# Patient Record
Sex: Female | Born: 1995
Health system: Southern US, Community
[De-identification: ages and names within clinical notes are randomized; demographics above are authoritative.]

## PROBLEM LIST (undated history)

## (undated) DIAGNOSIS — F419 Anxiety disorder, unspecified: Secondary | ICD-10-CM

## (undated) DIAGNOSIS — I10 Essential (primary) hypertension: Secondary | ICD-10-CM

## (undated) HISTORY — DX: Anxiety disorder, unspecified: F41.9

## (undated) HISTORY — PX: TYMPANOSTOMY TUBE PLACEMENT: SHX32

## (undated) HISTORY — PX: WISDOM TOOTH EXTRACTION: SHX21

## (undated) HISTORY — DX: Essential (primary) hypertension: I10

---
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
## 2013-01-10 ENCOUNTER — Ambulatory Visit: Payer: Self-pay | Admitting: Family Medicine

## 2013-07-02 ENCOUNTER — Encounter (HOSPITAL_COMMUNITY): Payer: Self-pay | Admitting: *Deleted

## 2013-07-02 ENCOUNTER — Emergency Department (HOSPITAL_COMMUNITY)
Admission: EM | Admit: 2013-07-02 | Discharge: 2013-07-02 | Disposition: A | Discharge status: Home / Self Care | Payer: 59 | Source: Home / Self Care | Attending: Family Medicine | Admitting: Family Medicine

## 2013-07-02 ENCOUNTER — Emergency Department (INDEPENDENT_AMBULATORY_CARE_PROVIDER_SITE_OTHER): Payer: 59

## 2013-07-02 DIAGNOSIS — M659 Synovitis and tenosynovitis, unspecified: Secondary | ICD-10-CM

## 2013-07-02 MED ORDER — MELOXICAM 7.5 MG PO TABS: 7.5000 mg | ORAL_TABLET | Freq: Two times a day (BID) | ORAL | Status: DC

## 2013-07-02 NOTE — ED Provider Notes (Addendum)
CSN: [id number]     Arrival date & time 07/02/13  4098 History   First MD Initiated Contact with Patient 07/02/13 1000     Chief Complaint  Patient presents with  . Foot Pain   (Consider location/radiation/quality/duration/timing/severity/associated sxs/prior Treatment) Patient is a 17 y.o. female presenting with lower extremity pain. The history is provided by the patient.  Foot Pain This is a new problem. The current episode started more than 1 week ago (felt like toe cramp initially, worse yest, NKI, wears sandals all the time.). The problem has been gradually worsening. The symptoms are aggravated by walking.    History reviewed. No pertinent past medical history. History reviewed. No pertinent past surgical history. History reviewed. No pertinent family history. History  Substance Use Topics  . Smoking status: Not on file  . Smokeless tobacco: Not on file  . Alcohol Use: Not on file   OB History   Grav Para Term Preterm Abortions TAB SAB Ect Mult Living                 Review of Systems  Constitutional: Negative.   Musculoskeletal: Positive for joint swelling and gait problem. Negative for back pain.  Skin: Negative.     Allergies  Amoxicillin  Home Medications   Current Outpatient Rx  Name  Route  Sig  Dispense  Refill  . meloxicam (MOBIC) 7.5 MG tablet   Oral   Take 1 tablet (7.5 mg total) by mouth 2 (two) times daily after a meal.   30 tablet   1    BP 133/94  Pulse 77  Temp(Src) 97.5 F (36.4 C) (Oral)  Resp 18  SpO2 95% Physical Exam  Nursing note and vitals reviewed. Constitutional: She is oriented to person, place, and time. She appears well-developed and well-nourished.  Musculoskeletal: She exhibits tenderness.       Feet:  Neurological: She is alert and oriented to person, place, and time.  Skin: Skin is warm and dry.    ED Course  Procedures (including critical care time) Labs Review Labs Reviewed - No data to display Imaging  Review Dg Toe Great Right  07/02/2013   CLINICAL DATA:  Great toe pain. Soft tissue swelling.  EXAM: RIGHT GREAT TOE  COMPARISON:  None.  FINDINGS: No fracture or acute bony findings. No foreign body or gas in the soft tissues. No articular erosion observed.  IMPRESSION: Negative.   Electronically Signed   By: Herbie Baltimore   On: 07/02/2013 10:36    MDM  X-rays reviewed and report per radiologist.     Linna Hoff, MD 07/02/13 1048  Linna Hoff, MD 07/02/13 1050

## 2013-07-02 NOTE — ED Notes (Signed)
Pt  Reports    r  Foot  Pain         X  1  Week   =-  denys  Any  specefic  Injury      Ambulatory  To  Exam room     Sitting  Upright on  Exam table  In no  Acute  Distress

## 2013-12-25 ENCOUNTER — Emergency Department (INDEPENDENT_AMBULATORY_CARE_PROVIDER_SITE_OTHER)
Admission: EM | Admit: 2013-12-25 | Discharge: 2013-12-25 | Disposition: A | Discharge status: Home / Self Care | Payer: 59 | Source: Home / Self Care | Attending: Emergency Medicine | Admitting: Emergency Medicine

## 2013-12-25 ENCOUNTER — Encounter: Payer: Self-pay | Admitting: Emergency Medicine

## 2013-12-25 DIAGNOSIS — J069 Acute upper respiratory infection, unspecified: Secondary | ICD-10-CM

## 2013-12-25 MED ORDER — METHYLPREDNISOLONE ACETATE 80 MG/ML IJ SUSP
80.0000 mg | Freq: Once | INTRAMUSCULAR | Status: AC
  Administered 2013-12-25: 80 mg via INTRAMUSCULAR

## 2013-12-25 MED ORDER — GUAIFENESIN-CODEINE 100-10 MG/5ML PO SYRP: 5.0000 mL | ORAL_SOLUTION | Freq: Four times a day (QID) | ORAL | Status: DC | PRN

## 2013-12-25 MED ORDER — AZITHROMYCIN 250 MG PO TABS: ORAL_TABLET | ORAL | Status: DC

## 2013-12-25 NOTE — ED Notes (Signed)
On break from college; has felt congested with cough x 2 weeks; body aches, sore throat, headaches and mild shortness of breath with cough. Took Ibuprofen at 1:00pm.

## 2013-12-25 NOTE — ED Provider Notes (Signed)
CSN: [id number]     Arrival date & time 12/25/13  1549 History   First MD Initiated Contact with Patient 12/25/13 1628     Chief Complaint  Patient presents with  . Cough  . Nasal Congestion  . Headache  . Generalized Body Aches  . Shortness of Breath   (Consider location/radiation/quality/duration/timing/severity/associated sxs/prior Treatment) HPI Severa is a 18 y.o. female who complains of onset of cold symptoms for 2 weeks.  The symptoms are constant and mild-moderate in severity.  No known sick contacts.  Started in chest, progressing to sinuses and worsening.  Took Sudafed and Ibu which didn't help much.  Here with her mom. + sore throat + cough No pleuritic pain No wheezing + nasal congestion + post-nasal drainage + sinus pain/pressure + chest congestion No itchy/red eyes No earache No hemoptysis No SOB No chills/sweats No fever No nausea No vomiting No abdominal pain No diarrhea No skin rashes + fatigue + myalgias + headache     History reviewed. No pertinent past medical history. Past Surgical History  Procedure Laterality Date  . Tympanostomy tube placement     Family History  Problem Relation Age of Onset  . Hypertension Mother   . Thyroid disease Mother   . Hypertension Maternal Aunt   . Hypertension Maternal Uncle    History  Substance Use Topics  . Smoking status: Never Smoker   . Smokeless tobacco: Not on file  . Alcohol Use: No   OB History   Grav Para Term Preterm Abortions TAB SAB Ect Mult Living                 Review of Systems  All other systems reviewed and are negative.    Allergies  Amoxicillin  Home Medications   Current Outpatient Rx  Name  Route  Sig  Dispense  Refill  . azithromycin (ZITHROMAX Z-PAK) 250 MG tablet      Use as directed   1 each   0   . guaiFENesin-codeine (ROBITUSSIN AC) 100-10 MG/5ML syrup   Oral   Take 5 mLs by mouth 4 (four) times daily as needed for cough or congestion.   120 mL   0    . meloxicam (MOBIC) 7.5 MG tablet   Oral   Take 1 tablet (7.5 mg total) by mouth 2 (two) times daily after a meal.   30 tablet   1    BP 129/84  Pulse 109  Temp(Src) 98.2 F (36.8 C) (Oral)  Resp 16  SpO2 98%  LMP 12/17/2013 Physical Exam  Nursing note and vitals reviewed. Constitutional: She is oriented to person, place, and time. She appears well-developed and well-nourished.  HENT:  Head: Normocephalic and atraumatic.  Right Ear: Tympanic membrane, external ear and ear canal normal.  Left Ear: External ear and ear canal normal. Tympanic membrane is scarred.  Nose: Mucosal edema and rhinorrhea present.  Mouth/Throat: Posterior oropharyngeal erythema present. No oropharyngeal exudate or posterior oropharyngeal edema.  Eyes: No scleral icterus.  Neck: Neck supple.  Cardiovascular: Regular rhythm and normal heart sounds.   Pulmonary/Chest: Effort normal and breath sounds normal. No respiratory distress. She has no decreased breath sounds. She has no wheezes. She has no rhonchi.  Neurological: She is alert and oriented to person, place, and time.  Skin: Skin is warm and dry.  Psychiatric: She has a normal mood and affect. Her speech is normal.    ED Course  Procedures (including critical care time) Labs Review Labs Reviewed -  No data to display Imaging Review No results found.   MDM   1. Acute upper respiratory infections of unspecified site    1)  Take the prescribed antibiotic as instructed.  Zpak Rx + Cheratussin AC cough meds.  Pt & mom requested "a shot" so Depo 80 was given IM which I think is ok since may just be viral and could help the fastest. 2)  Use nasal saline solution (over the counter) at least 3 times a day. 3)  Use over the counter decongestants like Zyrtec-D every 12 hours as needed to help with congestion.  If you have hypertension, do not take medicines with sudafed.  4)  Can take tylenol every 6 hours or motrin every 8 hours for pain or fever. 5)   Follow up with your primary doctor if no improvement in 5-7 days, sooner if increasing pain, fever, or new symptoms.     Marlaine HindJeffrey H Henderson, MD 12/25/13 [id number]

## 2014-04-24 ENCOUNTER — Encounter (INDEPENDENT_AMBULATORY_CARE_PROVIDER_SITE_OTHER): Payer: Self-pay

## 2014-04-24 ENCOUNTER — Encounter: Payer: Self-pay | Admitting: Internal Medicine

## 2014-04-24 ENCOUNTER — Ambulatory Visit (INDEPENDENT_AMBULATORY_CARE_PROVIDER_SITE_OTHER): Payer: 59 | Admitting: Internal Medicine

## 2014-04-24 VITALS — BP 120/82 | HR 84 | Temp 98.4°F | Ht 69.25 in | Wt 140.0 lb

## 2014-04-24 DIAGNOSIS — M25569 Pain in unspecified knee: Secondary | ICD-10-CM

## 2014-04-24 DIAGNOSIS — M25561 Pain in right knee: Secondary | ICD-10-CM

## 2014-04-24 DIAGNOSIS — M25562 Pain in left knee: Principal | ICD-10-CM

## 2014-04-24 NOTE — Patient Instructions (Addendum)

## 2014-04-24 NOTE — Progress Notes (Signed)
HPI  Pt presents to the clinic today to establish care. She does not have a PCP but does seen gyn. She does have some concerns today about knee pain. She reports that this come and goes. It started a few years ago. 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 She does not hear her knees pop or click. The pain does not radiate. She has not noticed any swelling. She has tired Aleve with good relief.  No past medical history on file.  Current Outpatient Prescriptions  Medication Sig Dispense Refill  . Norethindrone-Ethinyl Estradiol-Fe Biphas (LO LOESTRIN FE) 1 MG-10 MCG / 10 MCG tablet Take 1 tablet by mouth daily.       No current facility-administered medications for this visit.    Allergies  Allergen Reactions  . Amoxicillin     REACTION: rash    Family History  Problem Relation Age of Onset  . Hypertension Mother   . Thyroid disease Mother   . Hypertension Maternal Aunt   . Hypertension Maternal Uncle   . Heart disease Maternal Grandmother   . Cancer Maternal Grandmother     Breast    History   Social History  . Marital Status: Single    Spouse Name: N/A    Number of Children: N/A  . Years of Education: N/A   Occupational History  . Not on file.   Social History Main Topics  . Smoking status: Never Smoker   . Smokeless tobacco: Never Used  . Alcohol Use: No  . Drug Use: No  . Sexual Activity: Not on file   Other Topics Concern  . Not on file   Social History Narrative  . No narrative on file    ROS:  Constitutional: Denies fever, malaise, fatigue, headache or abrupt weight changes.  HEENT: Denies eye pain, eye redness, ear pain, ringing in the ears, wax buildup, runny nose, nasal congestion, bloody nose, or sore throat. Respiratory: Denies difficulty breathing, shortness of breath, cough or sputum production.   Cardiovascular: Denies chest pain, chest tightness,  palpitations or swelling in the hands or feet.  Gastrointestinal: Denies abdominal pain, bloating, constipation, diarrhea or blood in the stool.  GU: Denies frequency, urgency, pain with urination, blood in urine, odor or discharge. Musculoskeletal: Pt reports knee pain. Denies decrease in range of motion, difficulty with gait, muscle pain or joint swelling.  Skin: Denies redness, rashes, lesions or ulcercations.  Neurological: Denies dizziness, difficulty with memory, difficulty with speech or problems with balance and coordination.   No other specific complaints in a complete review of systems (except as listed in HPI above).  PE:  BP 120/82  Pulse 84  Temp(Src) 98.4 F (36.9 C) (Oral)  Ht 5' 9.25" (1.759 m)  Wt 140 lb (63.504 kg)  BMI 20.52 kg/m2  SpO2 99%  LMP 04/18/2014 Wt Readings from Last 3 Encounters:  04/24/14 140 lb (63.504 kg) (70%*, Z = 0.52)  12/25/13 140 lb (63.504 kg) (71%*, Z = 0.55)  10/18/09 131 lb (59.421 kg) (75%*, Z = 0.67)   * Growth percentiles are based on CDC 2-20 Years data.    General: Appears her stated age, well developed, well nourished in NAD. Cardiovascular: Normal rate and rhythm. S1,S2 noted.  No murmur, rubs or gallops noted. No JVD or BLE edema. No carotid bruits noted. Pulmonary/Chest: Normal effort and positive vesicular breath sounds. No respiratory distress.  No wheezes, rales or ronchi noted.  Musculoskeletal: Normal flexion and extension of the knees. No crepitus noted with ROM. Strength 5/5 BLE. Normal gait.   Assessment and Plan:  Bilateral Knee pain:  Likely due to standing for long periods of time Ok to wear soft knee brace at work Continue Aleve if needed  RTC as needed or if symptoms persist or worsen

## 2014-04-24 NOTE — Progress Notes (Signed)
Pre visit review using our clinic review tool, if applicable. No additional management support is needed unless otherwise documented below in the visit note. 

## 2014-05-18 ENCOUNTER — Encounter: Payer: Self-pay | Admitting: Internal Medicine

## 2014-05-18 ENCOUNTER — Ambulatory Visit (INDEPENDENT_AMBULATORY_CARE_PROVIDER_SITE_OTHER): Payer: 59 | Admitting: Internal Medicine

## 2014-05-18 VITALS — BP 108/60 | HR 94 | Temp 99.3°F | Wt 142.5 lb

## 2014-05-18 DIAGNOSIS — J069 Acute upper respiratory infection, unspecified: Secondary | ICD-10-CM

## 2014-05-18 MED ORDER — AZITHROMYCIN 250 MG PO TABS: ORAL_TABLET | ORAL | Status: DC

## 2014-05-18 MED ORDER — HYDROCODONE-HOMATROPINE 5-1.5 MG/5ML PO SYRP: 5.0000 mL | ORAL_SOLUTION | Freq: Three times a day (TID) | ORAL | Status: DC | PRN

## 2014-05-18 MED ORDER — BENZONATATE 200 MG PO CAPS: 200.0000 mg | ORAL_CAPSULE | Freq: Two times a day (BID) | ORAL | Status: DC | PRN

## 2014-05-18 NOTE — Progress Notes (Signed)
HPI  Pt presents to the clinic today with c/o cough and fever. She reports this started 1 week. The cough is unproductive. She has had associated headache, runny nose, chills and body aches. She has tried Sudafed without relief. She has no history of allergies or breathing problems. She has not had sick contacts that she is aware of. She feels like her symptoms are getting much worse.  Review of Systems     History reviewed. No pertinent past medical history.  Family History  Problem Relation Age of Onset  . Hypertension Mother   . Thyroid disease Mother   . Hypertension Maternal Aunt   . Hypertension Maternal Uncle   . Heart disease Maternal Grandmother   . Cancer Maternal Grandmother     Breast    History   Social History  . Marital Status: Single    Spouse Name: N/A    Number of Children: N/A  . Years of Education: N/A   Occupational History  . Not on file.   Social History Main Topics  . Smoking status: Never Smoker   . Smokeless tobacco: Never Used  . Alcohol Use: No  . Drug Use: No  . Sexual Activity: Yes    Birth Control/ Protection: Pill   Other Topics Concern  . Not on file   Social History Narrative  . No narrative on file    Allergies  Allergen Reactions  . Amoxicillin     REACTION: rash     Constitutional: Positive headache, fatigue and fever. Denies abrupt weight changes.  HEENT:  Positive sore throat. Denies eye redness, eye pain, pressure behind the eyes, facial pain, nasal congestion, ear pain, ringing in the ears, wax buildup, runny nose or bloody nose. Respiratory: Positive cough. Denies difficulty breathing or shortness of breath.  Cardiovascular: Denies chest pain, chest tightness, palpitations or swelling in the hands or feet.   No other specific complaints in a complete review of systems (except as listed in HPI above).  Objective:   BP 108/60  Pulse 94  Temp(Src) 99.3 F (37.4 C) (Oral)  Wt 142 lb 8 oz (64.638 kg)  SpO2 97%   LMP 04/18/2014 Wt Readings from Last 3 Encounters:  05/18/14 142 lb 8 oz (64.638 kg) (73%*, Z = 0.60)  04/24/14 140 lb (63.504 kg) (70%*, Z = 0.52)  12/25/13 140 lb (63.504 kg) (71%*, Z = 0.55)   * Growth percentiles are based on CDC 2-20 Years data.     General: Appears her stated age, well developed, well nourished in NAD. HEENT: Head: normal shape and size; Eyes: sclera white, no icterus, conjunctiva pink, PERRLA and EOMs intact; Ears: Tm's gray and intact, normal light reflex; Nose: mucosa pink and moist, septum midline; Throat/Mouth: + PND. Teeth present, mucosa erythematous and moist, no exudate noted, no lesions or ulcerations noted.  Neck: Mild cervical lymphadenopathy. Neck supple, trachea midline. No massses, lumps or thyromegaly present.  Cardiovascular: Normal rate and rhythm. S1,S2 noted.  No murmur, rubs or gallops noted. No JVD or BLE edema. No carotid bruits noted. Pulmonary/Chest: Normal effort and positive vesicular breath sounds. No respiratory distress. No wheezes, rales or ronchi noted.      Assessment & Plan:   Upper Respiratory Infection:  Get some rest and drink plenty of water Do salt water gargles for the sore throat eRx for Azithromax x 5 days eRx for Hycodan cough syrup for night eRx for tessalon pearsl for daytime  RTC as needed or if symptoms persist.

## 2014-05-18 NOTE — Progress Notes (Signed)
Pre visit review using our clinic review tool, if applicable. No additional management support is needed unless otherwise documented below in the visit note. 

## 2014-05-18 NOTE — Patient Instructions (Addendum)
Upper Respiratory Infection, Adult An upper respiratory infection (URI) is also sometimes known as the common cold. The upper respiratory tract includes the nose, sinuses, throat, trachea, and bronchi. Bronchi are the airways leading to the lungs. Most people improve within 1 week, but symptoms can last up to 2 weeks. A residual cough may last even longer.  CAUSES Many different viruses can infect the tissues lining the upper respiratory tract. The tissues become irritated and inflamed and often become very moist. Mucus production is also common. A cold is contagious. You can easily spread the virus to others by oral contact. This includes kissing, sharing a glass, coughing, or sneezing. Touching your mouth or nose and then touching a surface, which is then touched by another person, can also spread the virus. SYMPTOMS  Symptoms typically develop 1 to 3 days after you come in contact with a cold virus. Symptoms vary from person to person. They may include:  Runny nose.  Sneezing.  Nasal congestion.  Sinus irritation.  Sore throat.  Loss of voice (laryngitis).  Cough.  Fatigue.  Muscle aches.  Loss of appetite.  Headache.  Low-grade fever. DIAGNOSIS  You might diagnose your own cold based on familiar symptoms, since most people get a cold 2 to 3 times a year. Your caregiver can confirm this based on your exam. Most importantly, your caregiver can check that your symptoms are not due to another disease such as strep throat, sinusitis, pneumonia, asthma, or epiglottitis. Blood tests, throat tests, and X-rays are not necessary to diagnose a common cold, but they may sometimes be helpful in excluding other more serious diseases. Your caregiver will decide if any further tests are required. RISKS AND COMPLICATIONS  You may be at risk for a more severe case of the common cold if you smoke cigarettes, have chronic heart disease (such as heart failure) or lung disease (such as asthma), or if  you have a weakened immune system. The very young and very old are also at risk for more serious infections. Bacterial sinusitis, middle ear infections, and bacterial pneumonia can complicate the common cold. The common cold can worsen asthma and chronic obstructive pulmonary disease (COPD). Sometimes, these complications can require emergency medical care and may be life-threatening. PREVENTION  The best way to protect against getting a cold is to practice good hygiene. Avoid oral or hand contact with people with cold symptoms. Wash your hands often if contact occurs. There is no clear evidence that vitamin C, vitamin E, echinacea, or exercise reduces the chance of developing a cold. However, it is always recommended to get plenty of rest and practice good nutrition. TREATMENT  Treatment is directed at relieving symptoms. There is no cure. Antibiotics are not effective, because the infection is caused by a virus, not by bacteria. Treatment may include:  Increased fluid intake. Sports drinks offer valuable electrolytes, sugars, and fluids.  Breathing heated mist or steam (vaporizer or shower).  Eating chicken soup or other clear broths, and maintaining good nutrition.  Getting plenty of rest.  Using gargles or lozenges for comfort.  Controlling fevers with ibuprofen or acetaminophen as directed by your caregiver.  Increasing usage of your inhaler if you have asthma. Zinc gel and zinc lozenges, taken in the first 24 hours of the common cold, can shorten the duration and lessen the severity of symptoms. Pain medicines may help with fever, muscle aches, and throat pain. A variety of non-prescription medicines are available to treat congestion and runny nose. Your caregiver   can make recommendations and may suggest nasal or lung inhalers for other symptoms.  HOME CARE INSTRUCTIONS   Only take over-the-counter or prescription medicines for pain, discomfort, or fever as directed by your  caregiver.  Use a warm mist humidifier or inhale steam from a shower to increase air moisture. This may keep secretions moist and make it easier to breathe.  Drink enough water and fluids to keep your urine clear or pale yellow.  Rest as needed.  Return to work when your temperature has returned to normal or as your caregiver advises. You may need to stay home longer to avoid infecting others. You can also use a face mask and careful hand washing to prevent spread of the virus. SEEK MEDICAL CARE IF:   After the first few days, you feel you are getting worse rather than better.  You need your caregiver's advice about medicines to control symptoms.  You develop chills, worsening shortness of breath, or brown or red sputum. These may be signs of pneumonia.  You develop yellow or brown nasal discharge or pain in the face, especially when you bend forward. These may be signs of sinusitis.  You develop a fever, swollen neck glands, pain with swallowing, or white areas in the back of your throat. These may be signs of strep throat. SEEK IMMEDIATE MEDICAL CARE IF:   You have a fever.  You develop severe or persistent headache, ear pain, sinus pain, or chest pain.  You develop wheezing, a prolonged cough, cough up blood, or have a change in your usual mucus (if you have chronic lung disease).  You develop sore muscles or a stiff neck. Document Released: 03/25/2001 Document Revised: 12/22/2011 Document Reviewed: 01/04/2014 Exit[employer] Patient Information 2015 [employer]. This information is not intended to replace advice given to you by your health care provider. Make sure you discuss any questions you have with your health care provider.  

## 2014-05-23 ENCOUNTER — Telehealth: Payer: Self-pay

## 2014-05-23 NOTE — Telephone Encounter (Signed)
Left msg on mother's VM--letter placed in front office for pick up

## 2014-05-23 NOTE — Telephone Encounter (Signed)
Ok. Mel- can you take care of this? RB

## 2014-05-23 NOTE — Telephone Encounter (Signed)
Misty StanleyLisa pt's mother left v/m requesting a letter for pt to be out of work when seen on 05/18/14. Lisa request cb at [phone]. Pt was out of work from 05/18/14 - 05/21/14.

## 2014-05-30 ENCOUNTER — Encounter: Payer: Self-pay | Admitting: Internal Medicine

## 2014-05-30 ENCOUNTER — Ambulatory Visit (INDEPENDENT_AMBULATORY_CARE_PROVIDER_SITE_OTHER): Payer: 59 | Admitting: Internal Medicine

## 2014-05-30 VITALS — BP 128/89 | HR 88 | Resp 18 | Wt 138.1 lb

## 2014-05-30 DIAGNOSIS — F411 Generalized anxiety disorder: Secondary | ICD-10-CM | POA: Insufficient documentation

## 2014-05-30 DIAGNOSIS — G4700 Insomnia, unspecified: Secondary | ICD-10-CM

## 2014-05-30 MED ORDER — TRAZODONE HCL 50 MG PO TABS: 25.0000 mg | ORAL_TABLET | Freq: Every evening | ORAL | Status: DC | PRN

## 2014-05-30 MED ORDER — ESCITALOPRAM OXALATE 10 MG PO TABS: 10.0000 mg | ORAL_TABLET | Freq: Every day | ORAL | Status: DC

## 2014-05-30 NOTE — Assessment & Plan Note (Signed)
Will start low dose trazadone at night until Lexapro gets in her system Encouraged her to start a bedtime routine, dark room at night, no TV or music on

## 2014-05-30 NOTE — Assessment & Plan Note (Signed)
Will trial low dose lexapro Advised her to give it 4 weeks to get in her system good  RTC in 1 month to reevaluate

## 2014-05-30 NOTE — Progress Notes (Signed)
Subjective:    Patient ID: Sherri Harris, female    DOB: 28-Jul-1996, 18 y.o.   MRN: [id number]  HPI  Pt presents to the clinic today with c/o difficulty sleeping and anxiety. This started years ago but has gotten worse over the last month. She cannot identify a particular stressor, but she does feel very stressed out by "life". She is working 3 days per week. She lives at home with her mom, whom she has a good relationship with. She is attending school at [employer]. She reports that her twin sister did move to [place] recently. She does not feel depressed. She feels like she is unable to turn her mind off at night. She does have very odd, vivid dreams. She has tried Zquil, Nyquil and Melatonin. They do help but she feels like she may be getting addicted to it.  Review of Systems      No past medical history on file.  Current Outpatient Prescriptions  Medication Sig Dispense Refill  . azithromycin (ZITHROMAX) 250 MG tablet Take 2 tabs today, then 1 tab daily x 4 days  6 tablet  0  . benzonatate (TESSALON) 200 MG capsule Take 1 capsule (200 mg total) by mouth 2 (two) times daily as needed for cough.  20 capsule  0  . HYDROcodone-homatropine (HYCODAN) 5-1.5 MG/5ML syrup Take 5 mLs by mouth every 8 (eight) hours as needed for cough.  120 mL  0  . Norethindrone-Ethinyl Estradiol-Fe Biphas (LO LOESTRIN FE) 1 MG-10 MCG / 10 MCG tablet Take 1 tablet by mouth daily.       No current facility-administered medications for this visit.    Allergies  Allergen Reactions  . Amoxicillin     REACTION: rash    Family History  Problem Relation Age of Onset  . Hypertension Mother   . Thyroid disease Mother   . Hypertension Maternal Aunt   . Hypertension Maternal Uncle   . Heart disease Maternal Grandmother   . Cancer Maternal Grandmother     Breast    History   Social History  . Marital Status: Single    Spouse Name: N/A    Number of Children: N/A  . Years of Education: N/A   Occupational  History  . Not on file.   Social History Main Topics  . Smoking status: Never Smoker   . Smokeless tobacco: Never Used  . Alcohol Use: No  . Drug Use: No  . Sexual Activity: Yes    Birth Control/ Protection: Pill   Other Topics Concern  . Not on file   Social History Narrative  . No narrative on file     Constitutional: Denies fever, malaise, fatigue, headache or abrupt weight changes.  Respiratory: Denies difficulty breathing, shortness of breath, cough or sputum production.   Cardiovascular: Denies chest pain, chest tightness, palpitations or swelling in the hands or feet.  Neurological: Pt reports insomnia. Denies dizziness, difficulty with memory, difficulty with speech or problems with balance and coordination.  Psych: Pt reports anxiety. Denies depression, SI/HI.  No other specific complaints in a complete review of systems (except as listed in HPI above).  Objective:   Physical Exam    There were no vitals taken for this visit. Wt Readings from Last 3 Encounters:  05/18/14 142 lb 8 oz (64.638 kg) (73%*, Z = 0.60)  04/24/14 140 lb (63.504 kg) (70%*, Z = 0.52)  12/25/13 140 lb (63.504 kg) (71%*, Z = 0.55)   * Growth percentiles are  based on CDC 2-20 Years data.    General: Appears her stated age, well developed, well nourished in NAD. Cardiovascular: Normal rate and rhythm. S1,S2 noted.  No murmur, rubs or gallops noted. No JVD or BLE edema. No carotid bruits noted. Pulmonary/Chest: Normal effort and positive vesicular breath sounds. No respiratory distress. No wheezes, rales or ronchi noted.  Neurological: Alert and oriented.  Psychiatric: Mood anxious and affect normal. Behavior is normal. Judgment and thought content normal.        Assessment & Plan:

## 2014-05-30 NOTE — Patient Instructions (Addendum)
Generalized Anxiety Disorder Generalized anxiety disorder (GAD) is a mental disorder. It interferes with life functions, including relationships, work, and school. GAD is different from normal anxiety, which everyone experiences at some point in their lives in response to specific life events and activities. Normal anxiety actually helps us prepare for and get through these life events and activities. Normal anxiety goes away after the event or activity is over.  GAD causes anxiety that is not necessarily related to specific events or activities. It also causes excess anxiety in proportion to specific events or activities. The anxiety associated with GAD is also difficult to control. GAD can vary from mild to severe. People with severe GAD can have intense waves of anxiety with physical symptoms (panic attacks).  SYMPTOMS The anxiety and worry associated with GAD are difficult to control. This anxiety and worry are related to many life events and activities and also occur more days than not for 6 months or longer. People with GAD also have three or more of the following symptoms (one or more in children):  Restlessness.   Fatigue.  Difficulty concentrating.   Irritability.  Muscle tension.  Difficulty sleeping or unsatisfying sleep. DIAGNOSIS GAD is diagnosed through an assessment by your health care provider. Your health care provider will ask you questions aboutyour mood,physical symptoms, and events in your life. Your health care provider may ask you about your medical history and use of alcohol or drugs, including prescription medicines. Your health care provider may also do a physical exam and blood tests. Certain medical conditions and the use of certain substances can cause symptoms similar to those associated with GAD. Your health care provider may refer you to a mental health specialist for further evaluation. TREATMENT The following therapies are usually used to treat GAD:    Medication. Antidepressant medication usually is prescribed for long-term daily control. Antianxiety medicines may be added in severe cases, especially when panic attacks occur.   Talk therapy (psychotherapy). Certain types of talk therapy can be helpful in treating GAD by providing support, education, and guidance. A form of talk therapy called cognitive behavioral therapy can teach you healthy ways to think about and react to daily life events and activities.  Stress managementtechniques. These include yoga, meditation, and exercise and can be very helpful when they are practiced regularly. A mental health specialist can help determine which treatment is best for you. Some people see improvement with one therapy. However, other people require a combination of therapies. Document Released: 01/24/2013 Document Revised: 02/13/2014 Document Reviewed: 01/24/2013 ExitCare Patient Information 2015 [employer]. This information is not intended to replace advice given to you by your health care provider. Make sure you discuss any questions you have with your health care provider.  

## 2014-06-28 ENCOUNTER — Other Ambulatory Visit: Payer: Self-pay | Admitting: Internal Medicine

## 2014-06-30 NOTE — Telephone Encounter (Signed)
Last OV and last filled 05/30/14--please advise

## 2014-06-30 NOTE — Telephone Encounter (Signed)
Sig changed to 1 tab daily qhs and sent to pharmacy

## 2014-07-27 ENCOUNTER — Telehealth: Payer: Self-pay | Admitting: Internal Medicine

## 2014-07-27 NOTE — Telephone Encounter (Signed)
Patient Information:  Caller Name: [name]  Phone: [phone]  Patient: Sherri Harris, Sherri Harris  Gender: Female  DOB: 08/24/1996  Age: 18 Years  PCP: Nicki ReaperBaity, Regina  Pregnant: No  Office Follow Up:  Does the office need to follow up with this patient?: No  Instructions For The Office: N/A   Symptoms  Reason For Call & Symptoms: Started Lexapro 2 months ago.   Approximately 3 weeks ago pt had rash on left side of face, small tiny bumps, no pain, red, no itching.   07/27/14 rash continues   Pt has been using moisturizer, but unsure what has caused it.   Concerned it may be due to Lexapro.  Reviewed Health History In EMR: Yes  Reviewed Medications In EMR: Yes  Reviewed Allergies In EMR: Yes  Reviewed Surgeries / Procedures: Yes  Date of Onset of Symptoms: 07/06/2014 OB / GYN:  LMP: 03/27/2014  Guideline(s) Used:  Rash or Redness - Localized  Disposition Per Guideline:   See Within 3 Days in Office  Reason For Disposition Reached:   Localized rash present > 7 days  Advice Given:  Call Back If:  You become worse.  Patient Will Follow Care Advice:  YES  Appointment Scheduled:  07/28/2014 08:15:00 Appointment Scheduled Provider:  Kerby NoraBedsole, [facility](Family Practice)

## 2014-07-28 ENCOUNTER — Ambulatory Visit: Payer: Self-pay | Admitting: Family Medicine

## 2014-08-01 ENCOUNTER — Ambulatory Visit: Payer: Self-pay | Admitting: Family Medicine

## 2014-09-06 ENCOUNTER — Other Ambulatory Visit: Payer: Self-pay | Admitting: *Deleted

## 2014-09-06 DIAGNOSIS — F411 Generalized anxiety disorder: Secondary | ICD-10-CM

## 2014-09-06 MED ORDER — ESCITALOPRAM OXALATE 10 MG PO TABS: 10.0000 mg | ORAL_TABLET | Freq: Every day | ORAL | Status: DC

## 2014-10-22 ENCOUNTER — Emergency Department
Admission: EM | Admit: 2014-10-22 | Discharge: 2014-10-22 | Disposition: A | Discharge status: Home / Self Care | Payer: 59 | Source: Home / Self Care | Attending: Family Medicine | Admitting: Family Medicine

## 2014-10-22 DIAGNOSIS — N3001 Acute cystitis with hematuria: Secondary | ICD-10-CM

## 2014-10-22 MED ORDER — CIPROFLOXACIN HCL 500 MG PO TABS: 500.0000 mg | ORAL_TABLET | Freq: Two times a day (BID) | ORAL | Status: DC

## 2014-10-22 NOTE — ED Notes (Signed)
States symptoms started today, noted with bloody rination and constant pain

## 2014-10-22 NOTE — Discharge Instructions (Signed)
Thank you for coming in today. If your belly pain worsens, or you have high fever, bad vomiting, blood in your stool or black tarry stool go to the Emergency Room.   Urinary Tract Infection Urinary tract infections (UTIs) can develop anywhere along your urinary tract. Your urinary tract is your body's drainage system for removing wastes and extra water. Your urinary tract includes two kidneys, two ureters, a bladder, and a urethra. Your kidneys are a pair of bean-shaped organs. Each kidney is about the size of your fist. They are located below your ribs, one on each side of your spine. CAUSES Infections are caused by microbes, which are microscopic organisms, including fungi, viruses, and bacteria. These organisms are so small that they can only be seen through a microscope. Bacteria are the microbes that most commonly cause UTIs. SYMPTOMS  Symptoms of UTIs may vary by age and gender of the patient and by the location of the infection. Symptoms in young women typically include a frequent and intense urge to urinate and a painful, burning feeling in the bladder or urethra during urination. Older women and men are more likely to be tired, shaky, and weak and have muscle aches and abdominal pain. A fever may mean the infection is in your kidneys. Other symptoms of a kidney infection include pain in your back or sides below the ribs, nausea, and vomiting. DIAGNOSIS To diagnose a UTI, your caregiver will ask you about your symptoms. Your caregiver also will ask to provide a urine sample. The urine sample will be tested for bacteria and white blood cells. White blood cells are made by your body to help fight infection. TREATMENT  Typically, UTIs can be treated with medication. Because most UTIs are caused by a bacterial infection, they usually can be treated with the use of antibiotics. The choice of antibiotic and length of treatment depend on your symptoms and the type of bacteria causing your  infection. HOME CARE INSTRUCTIONS  If you were prescribed antibiotics, take them exactly as your caregiver instructs you. Finish the medication even if you feel better after you have only taken some of the medication.  Drink enough water and fluids to keep your urine clear or pale yellow.  Avoid caffeine, tea, and carbonated beverages. They tend to irritate your bladder.  Empty your bladder often. Avoid holding urine for long periods of time.  Empty your bladder before and after sexual intercourse.  After a bowel movement, women should cleanse from front to back. Use each tissue only once. SEEK MEDICAL CARE IF:   You have back pain.  You develop a fever.  Your symptoms do not begin to resolve within 3 days. SEEK IMMEDIATE MEDICAL CARE IF:   You have severe back pain or lower abdominal pain.  You develop chills.  You have nausea or vomiting.  You have continued burning or discomfort with urination. MAKE SURE YOU:   Understand these instructions.  Will watch your condition.  Will get help right away if you are not doing well or get worse. Document Released: 07/09/2005 Document Revised: 03/30/2012 Document Reviewed: 11/07/2011 ExitCare Patient Information 2015 [employer]. This information is not intended to replace advice given to you by your health care provider. Make sure you discuss any questions you have with your health care provider.  

## 2014-10-22 NOTE — ED Provider Notes (Signed)
Sherri SheererHeather E Harris is a 19 y.o. female who presents to Urgent Care today for UTI. Patient has a one-day history of urinary frequency urgency and dysuria with hematuria. Symptoms are consistent with prior UTI. She's tried AZO which helped a little. No fevers or chills vomiting or diarrhea.   History reviewed. No pertinent past medical history. Past Surgical History  Procedure Laterality Date  . Tympanostomy tube placement    . Wisdom tooth extraction     History  Substance Use Topics  . Smoking status: Never Smoker   . Smokeless tobacco: Never Used  . Alcohol Use: No   ROS as above Medications: No current facility-administered medications for this encounter.   Current Outpatient Prescriptions  Medication Sig Dispense Refill  . ciprofloxacin (CIPRO) 500 MG tablet Take 1 tablet (500 mg total) by mouth every 12 (twelve) hours. 14 tablet 0  . escitalopram (LEXAPRO) 10 MG tablet Take 1 tablet (10 mg total) by mouth daily. * Needs follow up appointment for additional refills* 30 tablet 0  . Norethindrone-Ethinyl Estradiol-Fe Biphas (LO LOESTRIN FE) 1 MG-10 MCG / 10 MCG tablet Take 1 tablet by mouth daily.    . [DISCONTINUED] traZODone (DESYREL) 50 MG tablet Take 1 tablet (50 mg total) by mouth at bedtime. 30 tablet 0   Allergies  Allergen Reactions  . Amoxicillin     REACTION: rash     Exam:  BP 116/77 mmHg  Pulse 89  Temp(Src) 98.3 F (36.8 C) (Oral)  Ht 5\' 9"  (1.753 m)  Wt 142 lb 8 oz (64.638 kg)  BMI 21.03 kg/m2  SpO2 97%  LMP 10/15/2014 Gen: Well NAD HEENT: EOMI,  MMM Lungs: Normal work of breathing. CTABL Heart: RRR no MRG Abd: NABS, Soft. Nondistended, Nontender no CV angle tenderness to percussion Exts: Brisk capillary refill, warm and well perfused.   No dipstick performed as our device is not accurate after AZO  No results found for this or any previous visit (from the past 24 hour(s)). No results found.  Assessment and Plan: 19 y.o. female with UTI probable.  Culture pending treat with cipro.  Discussed warning signs or symptoms. Please see discharge instructions. Patient expresses understanding.     Rodolph BongEvan S Ozzy Bohlken, MD 10/22/14 [id number]

## 2014-10-23 ENCOUNTER — Other Ambulatory Visit: Payer: Self-pay | Admitting: Internal Medicine

## 2014-10-24 ENCOUNTER — Other Ambulatory Visit: Payer: Self-pay | Admitting: Internal Medicine

## 2014-10-24 DIAGNOSIS — F411 Generalized anxiety disorder: Secondary | ICD-10-CM

## 2014-10-24 MED ORDER — ESCITALOPRAM OXALATE 10 MG PO TABS: 10.0000 mg | ORAL_TABLET | Freq: Every day | ORAL | Status: DC

## 2014-10-24 NOTE — Telephone Encounter (Signed)
Pt's mother left v/m checking on status of lexapro; pt is out of med. Pt last seen 05/30/14 and did not have 1 month f/u after 08/18 appt.Please advise. [facility] outpt pharmacy. Misty StanleyLisa pts mother request cb. DPR signed to speak with Misty StanleyLisa.

## 2014-10-24 NOTE — Telephone Encounter (Signed)
RX sent to [facility] outpatient pharmacy. If working well, no follow up needed until 08/2015

## 2014-10-25 ENCOUNTER — Telehealth: Payer: Self-pay | Admitting: *Deleted

## 2014-10-25 LAB — URINE CULTURE: Colony Count: >=100000

## 2014-10-25 NOTE — Telephone Encounter (Signed)
Left message on voicemail.

## 2014-11-22 ENCOUNTER — Other Ambulatory Visit: Payer: Self-pay | Admitting: *Deleted

## 2014-11-22 DIAGNOSIS — F411 Generalized anxiety disorder: Secondary | ICD-10-CM

## 2015-01-01 ENCOUNTER — Other Ambulatory Visit: Payer: Self-pay | Admitting: Internal Medicine

## 2015-01-01 ENCOUNTER — Encounter: Payer: Self-pay | Admitting: Internal Medicine

## 2015-01-01 DIAGNOSIS — F411 Generalized anxiety disorder: Secondary | ICD-10-CM

## 2015-01-01 MED ORDER — ESCITALOPRAM OXALATE 10 MG PO TABS: 10.0000 mg | ORAL_TABLET | Freq: Every day | ORAL | Status: DC

## 2015-04-05 ENCOUNTER — Ambulatory Visit (INDEPENDENT_AMBULATORY_CARE_PROVIDER_SITE_OTHER): Payer: 59 | Admitting: Sports Medicine

## 2015-04-05 ENCOUNTER — Encounter: Payer: Self-pay | Admitting: Sports Medicine

## 2015-04-05 VITALS — BP 131/91 | HR 77 | Ht 69.0 in | Wt 158.0 lb

## 2015-04-05 DIAGNOSIS — M25562 Pain in left knee: Secondary | ICD-10-CM | POA: Diagnosis not present

## 2015-04-05 MED ORDER — MELOXICAM 15 MG PO TABS: ORAL_TABLET | ORAL | Status: DC

## 2015-04-05 NOTE — Progress Notes (Signed)
   Subjective:    I'm seeing this patient as a consultation for: Dr. 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 For a decade now she's had pain that she localizes on the anterolateral aspect of her left knee, no mechanical symptoms but predominantly pain with terminal extension of the knee. This only minimal swelling, she does not recall any trauma. She is never had this evaluate before. Unfortunately it is worsening, pain does not radiate.  Past medical history, Surgical history, Family history not pertinant except as noted below, Social history, Allergies, and medications have been entered into the medical record, reviewed, and no changes needed.   Review of Systems: No headache, visual changes, nausea, vomiting, diarrhea, constipation, dizziness, abdominal pain, skin rash, fevers, chills, night sweats, weight loss, swollen lymph nodes, body aches, joint swelling, muscle aches, chest pain, shortness of breath, mood changes, visual or auditory hallucinations.   Objective:   General: Well Developed, well nourished, and in no acute distress.  Neuro/Psych: Alert and oriented x3, extra-ocular muscles intact, able to move all 4 extremities, sensation grossly intact. Skin: Warm and dry, no rashes noted.  Respiratory: Not using accessory muscles, speaking in full sentences, trachea midline.  Cardiovascular: Pulses palpable, no extremity edema. Abdomen: Does not appear distended. Left Knee: Normal to inspection with no erythema or effusion or obvious bony abnormalities. All the minimal tenderness over office fat-pad at terminal extension of the knee, there is also a palpable lateral patellar plica that is somewhat tender to palpation. There is only minimal tenderness under the lateral patellar facet, but this pain is not concordant. ROM normal in flexion and  extension and lower leg rotation. Ligaments with solid consistent endpoints including ACL, PCL, LCL, MCL. Negative Mcmurray's and provocative meniscal tests. Non painful patellar compression. Patellar and quadriceps tendons unremarkable. Hamstring and quadriceps strength is normal.  Impression and Recommendations:   This case required medical decision making of moderate complexity.

## 2015-04-05 NOTE — Assessment & Plan Note (Signed)
Differential is fairly block brought in this female athlete. Highest on the list is a lateral patellar plica syndrome, Hoffa's fat pad syndrome, versus lateral meniscal tear/patellofemoral syndrome. Meloxicam, x-rays, formal physical therapy, return in one month, MRI of no better.

## 2015-04-18 ENCOUNTER — Other Ambulatory Visit: Payer: Self-pay | Admitting: Internal Medicine

## 2015-04-24 ENCOUNTER — Ambulatory Visit (INDEPENDENT_AMBULATORY_CARE_PROVIDER_SITE_OTHER): Payer: 59 | Admitting: Licensed Clinical Social Worker

## 2015-04-24 DIAGNOSIS — F419 Anxiety disorder, unspecified: Secondary | ICD-10-CM

## 2015-05-03 ENCOUNTER — Ambulatory Visit: Payer: 59 | Admitting: Sports Medicine

## 2015-06-04 ENCOUNTER — Encounter: Payer: Self-pay | Admitting: Family Medicine

## 2015-06-04 ENCOUNTER — Ambulatory Visit (INDEPENDENT_AMBULATORY_CARE_PROVIDER_SITE_OTHER): Payer: 59 | Admitting: Family Medicine

## 2015-06-04 VITALS — BP 112/72 | HR 80 | Temp 98.1°F | Wt 156.8 lb

## 2015-06-04 DIAGNOSIS — J069 Acute upper respiratory infection, unspecified: Secondary | ICD-10-CM

## 2015-06-04 MED ORDER — AZITHROMYCIN 250 MG PO TABS: ORAL_TABLET | ORAL | Status: DC

## 2015-06-04 NOTE — Progress Notes (Signed)
SUBJECTIVE:  Sherri Harris is a 19 y.o. female who complains of coryza, congestion, sneezing, sore throat, swollen glands and bilateral sinus pain for 21 days. She denies a history of anorexia, chest pain, chills and dizziness and denies a history of asthma. Patient denies smoke cigarettes.   Current Outpatient Prescriptions on File Prior to Visit  Medication Sig Dispense Refill  . escitalopram (LEXAPRO) 10 MG tablet Take 1 tablet (10 mg total) by mouth daily. NEED TO SCHEDULE ANNUAL PHYSICAL/FOLLOW UP APPOINTMENT 30 tablet 0  . meloxicam (MOBIC) 15 MG tablet One tab PO qAM with breakfast for 2 weeks, then daily prn pain. 30 tablet 3  . Norethindrone-Ethinyl Estradiol-Fe Biphas (LO LOESTRIN FE) 1 MG-10 MCG / 10 MCG tablet Take 1 tablet by mouth daily.    . [DISCONTINUED] traZODone (DESYREL) 50 MG tablet Take 1 tablet (50 mg total) by mouth at bedtime. 30 tablet 0   No current facility-administered medications on file prior to visit.    Allergies  Allergen Reactions  . Amoxicillin     REACTION: rash    No past medical history on file.  Past Surgical History  Procedure Laterality Date  . Tympanostomy tube placement    . Wisdom tooth extraction      Family History  Problem Relation Age of Onset  . Hypertension Mother   . Thyroid disease Mother   . Hypertension Maternal Aunt   . Hypertension Maternal Uncle   . Heart disease Maternal Grandmother   . Cancer Maternal Grandmother     Breast    Social History   Social History  . Marital Status: Single    Spouse Name: N/A  . Number of Children: N/A  . Years of Education: N/A   Occupational History  . Not on file.   Social History Main Topics  . Smoking status: Never Smoker   . Smokeless tobacco: Never Used  . Alcohol Use: No  . Drug Use: No  . Sexual Activity: Yes    Birth Control/ Protection: Pill   Other Topics Concern  . Not on file   Social History Narrative   The PMH, PSH, Social History, Family History,  Medications, and allergies have been reviewed in [facility], and have been updated if relevant.  OBJECTIVE: BP 112/72 mmHg  Pulse 80  Temp(Src) 98.1 F (36.7 C) (Oral)  Wt 156 lb 12 oz (71.101 kg)  SpO2 98%  She appears well, vital signs are as noted. Ears normal.  Throat and pharynx normal.  Neck supple. No adenopathy in the neck. Nose is congested. Sinuses  tender. The chest is clear, without wheezes or rales.  ASSESSMENT:  sinusitis  PLAN: Given duration and progression of symptoms, will treat for bacterial sinusitis with zpack, PCN allergic.  Symptomatic therapy suggested: push fluids, rest and return office visit prn if symptoms persist or worsen.Call or return to clinic prn if these symptoms worsen or fail to improve as anticipated.

## 2015-06-04 NOTE — Progress Notes (Signed)
Pre visit review using our clinic review tool, if applicable. No additional management support is needed unless otherwise documented below in the visit note. 

## 2015-06-04 NOTE — Patient Instructions (Signed)
Take antibiotic as directed.  Drink lots of fluids.    Treat sympotmatically with Mucinex, nasal saline irrigation, and Tylenol/Ibuprofen.   Also try an antihistamine/decongestant like claritin D or zyrtec D over the counter- two times a day as needed ( have to sign for them at pharmacy).   Try over the counter nasocort-start with 2 sprays per nostril per day...and then try to taper to 1 spray per nostril once symptoms improve.   You can use warm compresses.    Call if not improving as expected in 5-7 days.    

## 2015-06-06 ENCOUNTER — Telehealth: Payer: Self-pay | Admitting: Internal Medicine

## 2015-06-06 NOTE — Telephone Encounter (Signed)
Pt needs a work note for missing work due to sinuses. Her mother states that she was seen on Monday with Dr Dayton Martes  with sinus.  Best number to call when ready to pick up is [phone]

## 2015-06-06 NOTE — Telephone Encounter (Signed)
Spoke to pts mother and informed her letter is available for pickup at the front desk

## 2015-06-11 ENCOUNTER — Telehealth: Payer: Self-pay | Admitting: Internal Medicine

## 2015-06-11 NOTE — Telephone Encounter (Signed)
Mom called ans is requesting another note for the appt that was on 06/04/15 w dr Dayton Martes. Please call mom at [phone] when ready to pick up

## 2015-06-11 NOTE — Telephone Encounter (Signed)
Spoke to pts mother who states pt needs copy of original letter for school. Printed and left at the front desk for pickup

## 2015-06-21 ENCOUNTER — Other Ambulatory Visit: Payer: Self-pay | Admitting: Internal Medicine

## 2015-06-21 ENCOUNTER — Telehealth: Payer: Self-pay

## 2015-06-21 NOTE — Telephone Encounter (Signed)
Pt left v/m; pt was seen on 06/04/15 with sinus infection. Pt finished Z pack 2 weeks ago and pt continues with sinus symptoms;head congestion, sneezing and upper teeth hurt, occasional cough. Pt took temp on 06/20/15 at 100.1 but today no fever. Pt has been taking mucinex and using nasal saline without relief. Explained to pt that Z pak continues to stay in system for another week after finishes med.Pt request refill Z pack or different abx  to [facility] [place]. Pt does not want to schedule f/u appt. Pt request cb.

## 2015-06-21 NOTE — Telephone Encounter (Signed)
She saw Dr. Dayton Martes for this. I will not refill antibiotics, if symptoms persist, likely more of an allergy component. She needs to take Zyrtec AND Flonase OTC x 1 week. If symptoms persist, she should make follow up appt.

## 2015-06-21 NOTE — Telephone Encounter (Signed)
Called pt in the middle of ringing it sounded like someone hung up on me--will try again later

## 2015-06-21 NOTE — Telephone Encounter (Signed)
Patient returned Melanie's call.  She's having problems with her phone.  Please call patient back at [phone].

## 2015-07-30 ENCOUNTER — Other Ambulatory Visit: Payer: Self-pay

## 2015-07-30 MED ORDER — ESCITALOPRAM OXALATE 10 MG PO TABS: 10.0000 mg | ORAL_TABLET | Freq: Every day | ORAL | Status: DC

## 2015-07-30 NOTE — Telephone Encounter (Signed)
pts mom checking on status of escitalopram refill; [facility] [place]. Pt will ck with pharmacy.

## 2015-07-30 NOTE — Telephone Encounter (Signed)
pts mom(DPR signed)said pt is visiting her sister in [place] until next week; pt is out of escitalopram and [facility] [place]. pts mom said pt would schedule appt on her return from [place] next week.Please advise. Pt last f/u 05/30/14. pts mom request cb when refilled.

## 2015-08-26 ENCOUNTER — Other Ambulatory Visit: Payer: Self-pay | Admitting: Internal Medicine

## 2015-08-27 ENCOUNTER — Other Ambulatory Visit: Payer: Self-pay | Admitting: Internal Medicine

## 2015-08-29 MED ORDER — ESCITALOPRAM OXALATE 10 MG PO TABS: 10.0000 mg | ORAL_TABLET | Freq: Every day | ORAL | Status: DC

## 2015-08-29 NOTE — Addendum Note (Signed)
Addended by: Roena MaladyEVONTENNO, Tasneem Cormier Y on: 08/29/2015 10:54 AM   Modules accepted: Orders

## 2015-09-04 ENCOUNTER — Encounter: Payer: Self-pay | Admitting: Internal Medicine

## 2015-09-04 ENCOUNTER — Ambulatory Visit (INDEPENDENT_AMBULATORY_CARE_PROVIDER_SITE_OTHER): Payer: 59 | Admitting: Internal Medicine

## 2015-09-04 VITALS — BP 116/72 | HR 98 | Temp 99.0°F | Ht 69.25 in | Wt 160.0 lb

## 2015-09-04 DIAGNOSIS — Z23 Encounter for immunization: Secondary | ICD-10-CM | POA: Diagnosis not present

## 2015-09-04 DIAGNOSIS — F411 Generalized anxiety disorder: Secondary | ICD-10-CM | POA: Diagnosis not present

## 2015-09-04 DIAGNOSIS — Z0000 Encounter for general adult medical examination without abnormal findings: Secondary | ICD-10-CM

## 2015-09-04 MED ORDER — ESCITALOPRAM OXALATE 10 MG PO TABS: 10.0000 mg | ORAL_TABLET | Freq: Every day | ORAL | Status: DC

## 2015-09-04 NOTE — Patient Instructions (Signed)
Health Maintenance, Female Adopting a healthy lifestyle and getting preventive care can go a long way to promote health and wellness. Talk with your health care provider about what schedule of regular examinations is right for you. This is a good chance for you to check in with your provider about disease prevention and staying healthy. In between checkups, there are plenty of things you can do on your own. Experts have done a lot of research about which lifestyle changes and preventive measures are most likely to keep you healthy. Ask your health care provider for more information. WEIGHT AND DIET  Eat a healthy diet  Be sure to include plenty of vegetables, fruits, low-fat dairy products, and lean protein.  Do not eat a lot of foods high in solid fats, added sugars, or salt.  Get regular exercise. This is one of the most important things you can do for your health.  Most adults should exercise for at least 150 minutes each week. The exercise should increase your heart rate and make you sweat (moderate-intensity exercise).  Most adults should also do strengthening exercises at least twice a week. This is in addition to the moderate-intensity exercise.  Maintain a healthy weight  Body mass index (BMI) is a measurement that can be used to identify possible weight problems. It estimates body fat based on height and weight. Your health care provider can help determine your BMI and help you achieve or maintain a healthy weight.  For females 21 years of age and older:   A BMI below 18.5 is considered underweight.  A BMI of 18.5 to 24.9 is normal.  A BMI of 25 to 29.9 is considered overweight.  A BMI of 30 and above is considered obese.  Watch levels of cholesterol and blood lipids  You should start having your blood tested for lipids and cholesterol at 21 years of age, then have this test every 5 years.  You may need to have your cholesterol levels checked more often if:  Your lipid  or cholesterol levels are high.  You are older than 21 years of age.  You are at high risk for heart disease.  CANCER SCREENING   Lung Cancer  Lung cancer screening is recommended for adults 21-21 years old who are at high risk for lung cancer because of a history of smoking.  A yearly low-dose CT scan of the lungs is recommended for people who:  Currently smoke.  Have quit within the past 15 years.  Have at least a 30-pack-year history of smoking. A pack year is smoking an average of one pack of cigarettes a day for 21 year.  Yearly screening should continue until it has been 15 years since you quit.  Yearly screening should stop if you develop a health problem that would prevent you from having lung cancer treatment.  Breast Cancer  Practice breast self-awareness. This means understanding how your breasts normally appear and feel.  It also means doing regular breast self-exams. Let your health care provider know about any changes, no matter how small.  If you are in your 20s or 30s, you should have a clinical breast exam (CBE) by a health care provider every 1-3 years as part of a regular health exam.  If you are 21 or older, have a CBE every year. Also consider having a breast X-ray (mammogram) every year.  If you have a family history of breast cancer, talk to your health care provider about genetic screening.  If you  are at high risk for breast cancer, talk to your health care provider about having an MRI and a mammogram every 21 year.  Breast cancer gene (BRCA) assessment is recommended for women who have family members with BRCA-related cancers. BRCA-related cancers include:  Breast.  Ovarian.  Tubal.  Peritoneal cancers.  Results of the assessment will determine the need for genetic counseling and BRCA21 and BRCA2 testing. Cervical Cancer Your health care provider may recommend that you be screened regularly for cancer of the pelvic organs (ovaries, uterus, and  vagina). This screening involves a pelvic examination, including checking for microscopic changes to the surface of your cervix (Pap test). You may be encouraged to have this screening done every 3 years, beginning at age 18.  For women ages 31-65, health care providers may recommend pelvic exams and Pap testing every 3 years, or they may recommend the Pap and pelvic exam, combined with testing for human papilloma virus (HPV), every 5 years. Some types of HPV increase your risk of cervical cancer. Testing for HPV may also be done on women of any age with unclear Pap test results.  Other health care providers may not recommend any screening for nonpregnant women who are considered low risk for pelvic cancer and who do not have symptoms. Ask your health care provider if a screening pelvic exam is right for you.  If you have had past treatment for cervical cancer or a condition that could lead to cancer, you need Pap tests and screening for cancer for at least 21 years after your treatment. If Pap tests have been discontinued, your risk factors (such as having a new sexual partner) need to be reassessed to determine if screening should resume. Some women have medical problems that increase the chance of getting cervical cancer. In these cases, your health care provider may recommend more frequent screening and Pap tests. Colorectal Cancer  This type of cancer can be detected and often prevented.  Routine colorectal cancer screening usually begins at 21 years of age and continues through 21 years of age.  Your health care provider may recommend screening at an earlier age if you have risk factors for colon cancer.  Your health care provider may also recommend using home test kits to check for hidden blood in the stool.  A small camera at the end of a tube can be used to examine your colon directly (sigmoidoscopy or colonoscopy). This is done to check for the earliest forms of colorectal  cancer.  Routine screening usually begins at age 21.  Direct examination of the colon should be repeated every 5-10 years through 21 years of age. However, you may need to be screened more often if early forms of precancerous polyps or small growths are found. Skin Cancer  Check your skin from head to toe regularly.  Tell your health care provider about any new moles or changes in moles, especially if there is a change in a mole's shape or color.  Also tell your health care provider if you have a mole that is larger than the size of a pencil eraser.  Always use sunscreen. Apply sunscreen liberally and repeatedly throughout the day.  Protect yourself by wearing long sleeves, pants, a wide-brimmed hat, and sunglasses whenever you are outside. HEART DISEASE, DIABETES, AND HIGH BLOOD PRESSURE   High blood pressure causes heart disease and increases the risk of stroke. High blood pressure is more likely to develop in:  People who have blood pressure in the high end  of the normal range (130-139/85-89 mm Hg).  People who are overweight or obese.  People who are African American.  If you are 38-23 years of age, have your blood pressure checked every 3-5 years. If you are 61 years of age or older, have your blood pressure checked every year. You should have your blood pressure measured twice--once when you are at a hospital or clinic, and once when you are not at a hospital or clinic. Record the average of the two measurements. To check your blood pressure when you are not at a hospital or clinic, you can use:  An automated blood pressure machine at a pharmacy.  A home blood pressure monitor.  If you are between 45 years and 39 years old, ask your health care provider if you should take aspirin to prevent strokes.  Have regular diabetes screenings. This involves taking a blood sample to check your fasting blood sugar level.  If you are at a normal weight and have a low risk for diabetes,  have this test once every three years after 21 years of age.  If you are overweight and have a high risk for diabetes, consider being tested at a younger age or more often. PREVENTING INFECTION  Hepatitis B  If you have a higher risk for hepatitis B, you should be screened for this virus. You are considered at high risk for hepatitis B if:  You were born in a country where hepatitis B is common. Ask your health care provider which countries are considered high risk.  Your parents were born in a high-risk country, and you have not been immunized against hepatitis B (hepatitis B vaccine).  You have HIV or AIDS.  You use needles to inject street drugs.  You live with someone who has hepatitis B.  You have had sex with someone who has hepatitis B.  You get hemodialysis treatment.  You take certain medicines for conditions, including cancer, organ transplantation, and autoimmune conditions. Hepatitis C  Blood testing is recommended for:  Everyone born from [birth year] through 1967.  Anyone with known risk factors for hepatitis C. Sexually transmitted infections (STIs)  You should be screened for sexually transmitted infections (STIs) including gonorrhea and chlamydia if:  You are sexually active and are younger than 21 years of age.  You are older than 21 years of age and your health care provider tells you that you are at risk for this type of infection.  Your sexual activity has changed since you were last screened and you are at an increased risk for chlamydia or gonorrhea. Ask your health care provider if you are at risk.  If you do not have HIV, but are at risk, it may be recommended that you take a prescription medicine daily to prevent HIV infection. This is called pre-exposure prophylaxis (PrEP). You are considered at risk if:  You are sexually active and do not regularly use condoms or know the HIV status of your partner(s).  You take drugs by injection.  You are sexually  active with a partner who has HIV. Talk with your health care provider about whether you are at high risk of being infected with HIV. If you choose to begin PrEP, you should first be tested for HIV. You should then be tested every 3 months for as long as you are taking PrEP.  PREGNANCY   If you are premenopausal and you may become pregnant, ask your health care provider about preconception counseling.  If you may  become pregnant, take 400 to 800 micrograms (mcg) of folic acid every day.  If you want to prevent pregnancy, talk to your health care provider about birth control (contraception). OSTEOPOROSIS AND MENOPAUSE   Osteoporosis is a disease in which the bones lose minerals and strength with aging. This can result in serious bone fractures. Your risk for osteoporosis can be identified using a bone density scan.  If you are 61 years of age or older, or if you are at risk for osteoporosis and fractures, ask your health care provider if you should be screened.  Ask your health care provider whether you should take a calcium or vitamin D supplement to lower your risk for osteoporosis.  Menopause may have certain physical symptoms and risks.  Hormone replacement therapy may reduce some of these symptoms and risks. Talk to your health care provider about whether hormone replacement therapy is right for you.  HOME CARE INSTRUCTIONS   Schedule regular health, dental, and eye exams.  Stay current with your immunizations.   Do not use any tobacco products including cigarettes, chewing tobacco, or electronic cigarettes.  If you are pregnant, do not drink alcohol.  If you are breastfeeding, limit how much and how often you drink alcohol.  Limit alcohol intake to no more than 1 drink per day for nonpregnant women. One drink equals 12 ounces of beer, 5 ounces of wine, or 1 ounces of hard liquor.  Do not use street drugs.  Do not share needles.  Ask your health care provider for help if  you need support or information about quitting drugs.  Tell your health care provider if you often feel depressed.  Tell your health care provider if you have ever been abused or do not feel safe at home.   This information is not intended to replace advice given to you by your health care provider. Make sure you discuss any questions you have with your health care provider.   Document Released: 04/14/2011 Document Revised: 10/20/2014 Document Reviewed: 08/31/2013 Elsevier Interactive Patient Education [employer].

## 2015-09-04 NOTE — Progress Notes (Signed)
Subjective:    Patient ID: Sherri Harris, female    DOB: 1996-04-01, 19 y.o.   MRN: [id number]  HPI  Pt presents to the clinic today for her annual exam.  Anxiety: Well controlled on Lexapro. She denies depression, panic attacks, SI/HI. She will need a refill of Lexapro today.  Flu: wants to get one today Tetanus: 2008 Dentist: biannually  Diet:She is vegetarian. She drinks mostly water, coffee and green tea. Exercise: She likes to do stair climber and squats 3 x week  She is sexually active with 1 partner. No protection. On OCP. G0P0.  Review of Systems      No past medical history on file.  Current Outpatient Prescriptions  Medication Sig Dispense Refill  . escitalopram (LEXAPRO) 10 MG tablet Take 1 tablet (10 mg total) by mouth daily. NO MORE REFILLS NEED TO SCHEDULE ANNUAL PHYSICAL [phone] 30 tablet 0  . LARIN 1/20 1-20 MG-MCG tablet Take 1 tablet by mouth daily.  11  . [DISCONTINUED] traZODone (DESYREL) 50 MG tablet Take 1 tablet (50 mg total) by mouth at bedtime. 30 tablet 0   No current facility-administered medications for this visit.    Allergies  Allergen Reactions  . Amoxicillin     REACTION: rash    Family History  Problem Relation Age of Onset  . Hypertension Mother   . Thyroid disease Mother   . Hypertension Maternal Aunt   . Hypertension Maternal Uncle   . Heart disease Maternal Grandmother   . Cancer Maternal Grandmother     Breast    Social History   Social History  . Marital Status: Single    Spouse Name: N/A  . Number of Children: N/A  . Years of Education: N/A   Occupational History  . Not on file.   Social History Main Topics  . Smoking status: Never Smoker   . Smokeless tobacco: Never Used  . Alcohol Use: 0.0 oz/week    0 Standard drinks or equivalent per week     Comment: occasional  . Drug Use: No  . Sexual Activity: Yes    Birth Control/ Protection: Pill   Other Topics Concern  . Not on file   Social History  Narrative     Constitutional: Denies fever, malaise, fatigue, headache or abrupt weight changes.  HEENT: Denies eye pain, eye redness, ear pain, ringing in the ears, wax buildup, runny nose, nasal congestion, bloody nose, or sore throat. Respiratory: Denies difficulty breathing, shortness of breath, cough or sputum production.   Cardiovascular: Denies chest pain, chest tightness, palpitations or swelling in the hands or feet.  Gastrointestinal: Denies abdominal pain, bloating, constipation, diarrhea or blood in the stool.  GU: Denies urgency, frequency, pain with urination, burning sensation, blood in urine, odor or discharge. Musculoskeletal: Denies decrease in range of motion, difficulty with gait, muscle pain or joint pain and swelling.  Skin: Denies redness, rashes, lesions or ulcercations.  Neurological: Denies dizziness, difficulty with memory, difficulty with speech or problems with balance and coordination.  Psych: Pt reports anxiety. Denies depression, SI/HI.  No other specific complaints in a complete review of systems (except as listed in HPI above).  Objective:   Physical Exam  BP 116/72 mmHg  Pulse 98  Temp(Src) 99 F (37.2 C) (Oral)  Ht 5' 9.25" (1.759 m)  Wt 160 lb (72.576 kg)  BMI 23.46 kg/m2  SpO2 99%  LMP  Wt Readings from Last 3 Encounters:  09/04/15 160 lb (72.576 kg)  06/04/15 156 lb  12 oz (71.101 kg)  04/05/15 158 lb (71.668 kg)    General: Appears her stated age, well developed, well nourished in NAD. Skin: Warm, dry and intact. No rashes, lesions or ulcerations noted. HEENT: Head: normal shape and size; Eyes: sclera white, no icterus, conjunctiva pink, PERRLA and EOMs intact; Ears: Tm's gray and intact, normal light reflex; Throat/Mouth: Teeth present, mucosa pink and moist, no exudate, lesions or ulcerations noted.  Neck:  Neck supple, trachea midline. No masses, lumps or thyromegaly present.  Cardiovascular: Normal rate and rhythm. S1,S2 noted.  No  murmur, rubs or gallops noted. No JVD or BLE edema. No carotid bruits noted. Pulmonary/Chest: Normal effort and positive vesicular breath sounds. No respiratory distress. No wheezes, rales or ronchi noted.  Abdomen: Soft and nontender. Normal bowel sounds. No distention or masses noted. Liver, spleen and kidneys non palpable. Musculoskeletal: Strength 5/5 BUE/BLE. No signs of joint swelling. No difficulty with gait.  Neurological: Alert and oriented. Cranial nerves II-XII grossly intact. Coordination normal.  Psychiatric: Mood and affect normal. Behavior is normal. Judgment and thought content normal.         Assessment & Plan:   Preventative Health Maintenance:  Encouraged her to consume a balanced diet and exercise regimen She declines screening labs/STD screen today Flu shot today, Tetanus UTD She does not want her pap today- will do next year Encouraged her to continue to see a dentist biannually  RTC in 1 year or sooner if needed

## 2015-09-04 NOTE — Assessment & Plan Note (Signed)
Controlled on Lexapro Medication refilled today

## 2015-09-04 NOTE — Addendum Note (Signed)
Addended by: Roena MaladyEVONTENNO, Legrand Lasser Y on: 09/04/2015 04:33 PM   Modules accepted: Orders

## 2015-09-05 ENCOUNTER — Other Ambulatory Visit: Payer: Self-pay | Admitting: Internal Medicine

## 2015-10-01 ENCOUNTER — Ambulatory Visit (INDEPENDENT_AMBULATORY_CARE_PROVIDER_SITE_OTHER): Payer: 59 | Admitting: Internal Medicine

## 2015-10-01 ENCOUNTER — Encounter: Payer: Self-pay | Admitting: Internal Medicine

## 2015-10-01 VITALS — BP 132/90 | HR 100 | Temp 98.1°F | Wt 163.0 lb

## 2015-10-01 DIAGNOSIS — J0100 Acute maxillary sinusitis, unspecified: Secondary | ICD-10-CM | POA: Diagnosis not present

## 2015-10-01 MED ORDER — HYDROCODONE-HOMATROPINE 5-1.5 MG/5ML PO SYRP: 5.0000 mL | ORAL_SOLUTION | Freq: Three times a day (TID) | ORAL | Status: DC | PRN

## 2015-10-01 MED ORDER — AZITHROMYCIN 250 MG PO TABS: ORAL_TABLET | ORAL | Status: DC

## 2015-10-01 NOTE — Patient Instructions (Signed)

## 2015-10-01 NOTE — Progress Notes (Signed)
HPI  Pt presents to the clinic today with c/o nasal congestion and cough. This started 3-4 weeks ago. The cough is non productive. She is blowing yellow mucous out of her nose. She has not run fevers that she is aware of but she has had chills and body aches. She has not tried anything OTC. She has no history of allergies or breathing problems. She has not had sick contacts that she is aware of.  Additionally, her mother request that she has blood work done because she is a vegetarian. She reports she does not want labs drawn.  Review of Systems   No past medical history on file.  Family History  Problem Relation Age of Onset  . Hypertension Mother   . Thyroid disease Mother   . Hypertension Maternal Aunt   . Hypertension Maternal Uncle   . Heart disease Maternal Grandmother   . Cancer Maternal Grandmother     Breast    Social History   Social History  . Marital Status: Single    Spouse Name: N/A  . Number of Children: N/A  . Years of Education: N/A   Occupational History  . Not on file.   Social History Main Topics  . Smoking status: Never Smoker   . Smokeless tobacco: Never Used  . Alcohol Use: 0.0 oz/week    0 Standard drinks or equivalent per week     Comment: occasional  . Drug Use: No  . Sexual Activity: Yes    Birth Control/ Protection: Pill   Other Topics Concern  . Not on file   Social History Narrative    Allergies  Allergen Reactions  . Amoxicillin     REACTION: rash     Constitutional:  Denies headache, fatigue, fever or abrupt weight changes.  HEENT:  Positive nasal congestion. Denies eye redness, ear pain, ringing in the ears, wax buildup, runny nose or sore throat. Respiratory: Positive cough. Denies difficulty breathing or shortness of breath.  Cardiovascular: Denies chest pain, chest tightness, palpitations or swelling in the hands or feet.   No other specific complaints in a complete review of systems (except as listed in HPI  above).  Objective:  BP 132/90 mmHg  Pulse 100  Temp(Src) 98.1 F (36.7 C) (Oral)  Wt 163 lb (73.936 kg)   General: Appears her stated age, in NAD. HEENT: Head: normal shape and size, maxillary sinus tenderness noted; Eyes: sclera white, no icterus, conjunctiva pink; Ears: Tm's pink but  intact, normal light reflex, + serous effusion on the right; Nose: mucosa boggy and moist, septum midline; Throat/Mouth: + PND. Teeth present, mucosa erythematous and moist, no exudate noted, no lesions or ulcerations noted.  Neck:  Cervical adenopathy noted bilaterally.  Cardiovascular: Normal rate and rhythm. S1,S2 noted.  No murmur, rubs or gallops noted.  Pulmonary/Chest: Normal effort and positive vesicular breath sounds. No respiratory distress. No wheezes, rales or ronchi noted.      Assessment & Plan:   Acute maxillary sinusitis  Can use a Neti Pot which can be purchased from your local drug store. Flonase 2 sprays each nostril for 3 days and then as needed. Auzithromax 250 mg x 5 days RX for Hycodan for cough  RTC as needed or if symptoms persist.

## 2015-10-01 NOTE — Progress Notes (Signed)
Pre visit review using our clinic review tool, if applicable. No additional management support is needed unless otherwise documented below in the visit note. 

## 2015-11-14 MED FILL — LARIN 21 1-20 TABLET: 1-20 | 84 days supply | Qty: 63 | Fill #3

## 2015-11-28 MED FILL — ESCITALOPRAM 10 MG TABLET: 10 | 90 days supply | Qty: 90 | Fill #0

## 2016-01-31 MED FILL — LARIN 21 1-20 TABLET: 1-20 | 56 days supply | Qty: 42 | Fill #4

## 2016-03-11 ENCOUNTER — Encounter: Payer: Self-pay | Admitting: Family Medicine

## 2016-03-11 MED FILL — LARIN 21 1-20 TABLET: 1-20 | 56 days supply | Qty: 42 | Fill #0

## 2016-03-11 NOTE — Telephone Encounter (Signed)
Will discuss at [facility] on thursday

## 2016-03-13 ENCOUNTER — Ambulatory Visit: Payer: Self-pay | Admitting: Family Medicine

## 2016-03-13 ENCOUNTER — Telehealth: Payer: Self-pay | Admitting: Internal Medicine

## 2016-03-13 DIAGNOSIS — Z0289 Encounter for other administrative examinations: Secondary | ICD-10-CM

## 2016-03-13 MED FILL — ESCITALOPRAM 10 MG TABLET: 10 | 90 days supply | Qty: 90 | Fill #1

## 2016-03-13 NOTE — Telephone Encounter (Signed)
Missed appt today. No f/u needed. Will cc pcp as fyi

## 2016-03-13 NOTE — Telephone Encounter (Signed)
Patient did not come for their scheduled appointment today for numbness in leg and knee painPlease let me know if the patient needs to be contacted immediately for follow up or if no follow up is necessary.

## 2016-04-02 ENCOUNTER — Ambulatory Visit (INDEPENDENT_AMBULATORY_CARE_PROVIDER_SITE_OTHER): Payer: 59 | Admitting: Internal Medicine

## 2016-04-02 ENCOUNTER — Ambulatory Visit (INDEPENDENT_AMBULATORY_CARE_PROVIDER_SITE_OTHER)
Admission: RE | Admit: 2016-04-02 | Discharge: 2016-04-02 | Disposition: A | Discharge status: Home / Self Care | Payer: 59 | Source: Ambulatory Visit | Attending: Internal Medicine | Admitting: Internal Medicine

## 2016-04-02 ENCOUNTER — Encounter: Payer: Self-pay | Admitting: Internal Medicine

## 2016-04-02 VITALS — BP 122/84 | HR 86 | Temp 98.2°F | Wt 178.0 lb

## 2016-04-02 DIAGNOSIS — M5416 Radiculopathy, lumbar region: Secondary | ICD-10-CM | POA: Diagnosis not present

## 2016-04-02 DIAGNOSIS — Z793 Long term (current) use of hormonal contraceptives: Secondary | ICD-10-CM

## 2016-04-02 DIAGNOSIS — N912 Amenorrhea, unspecified: Secondary | ICD-10-CM | POA: Diagnosis not present

## 2016-04-02 DIAGNOSIS — R5383 Other fatigue: Secondary | ICD-10-CM

## 2016-04-02 DIAGNOSIS — M5440 Lumbago with sciatica, unspecified side: Secondary | ICD-10-CM

## 2016-04-02 DIAGNOSIS — R635 Abnormal weight gain: Secondary | ICD-10-CM

## 2016-04-02 DIAGNOSIS — Z79899 Other long term (current) drug therapy: Secondary | ICD-10-CM

## 2016-04-02 LAB — COMPREHENSIVE METABOLIC PANEL
ALBUMIN: 4.3 g/dL (ref 3.5–5.2)
ALK PHOS: 62 U/L (ref 39–117)
ALT: 13 U/L (ref 0–35)
AST: 18 U/L (ref 0–37)
BILIRUBIN TOTAL: 0.4 mg/dL (ref 0.2–1.2)
BUN: 14 mg/dL (ref 6–23)
CALCIUM: 9.9 mg/dL (ref 8.4–10.5)
CO2: 23 mEq/L (ref 19–32)
Chloride: 103 mEq/L (ref 96–112)
Creatinine, Ser: 0.86 mg/dL (ref 0.40–1.20)
GFR: 88.08 mL/min (ref >60.00)
GLUCOSE: 83 mg/dL (ref 70–99)
POTASSIUM: 3.9 meq/L (ref 3.5–5.1)
Sodium: 134 mEq/L — ABNORMAL LOW (ref 135–145)
TOTAL PROTEIN: 7.2 g/dL (ref 6.0–8.3)

## 2016-04-02 LAB — CBC
HEMATOCRIT: 42.1 % (ref 36.0–46.0)
HEMOGLOBIN: 14.3 g/dL (ref 12.0–15.0)
MCHC: 33.9 g/dL (ref 30.0–36.0)
MCV: 88.8 fl (ref 78.0–100.0)
PLATELETS: 337 10*3/uL (ref 150.0–400.0)
RBC: 4.74 Mil/uL (ref 3.87–5.11)
RDW: 12.6 % (ref 11.5–15.5)
WBC: 9.9 10*3/uL (ref 4.0–10.5)

## 2016-04-02 LAB — POCT URINE PREGNANCY: PREG TEST UR: NEGATIVE

## 2016-04-02 LAB — VITAMIN D 25 HYDROXY (VIT D DEFICIENCY, FRACTURES): VITD: 34.39 ng/mL (ref 30.00–100.00)

## 2016-04-02 LAB — FOLATE: FOLATE: 10.4 ng/mL (ref >5.9)

## 2016-04-02 LAB — VITAMIN B12: Vitamin B-12: 295 pg/mL (ref 211–911)

## 2016-04-02 LAB — TSH: TSH: 0.76 u[IU]/mL (ref 0.35–4.50)

## 2016-04-02 NOTE — Patient Instructions (Signed)

## 2016-04-02 NOTE — Addendum Note (Signed)
Addended by: Baldomero LamyHAVERS, Deyja Sochacki C on: 04/02/2016 03:23 PM   Modules accepted: Orders

## 2016-04-02 NOTE — Progress Notes (Signed)
Pre visit review using our clinic review tool, if applicable. No additional management support is needed unless otherwise documented below in the visit note. 

## 2016-04-02 NOTE — Progress Notes (Signed)
Subjective:    Patient ID: Sherri Harris, female    DOB: 06/14/1996, 20 y.o.   MRN: [id number]  HPI  Pt presents to the clinic today with c/o low back pain. This started 2 months ago. She describes he pain as sharp and stabbing. She does have associated numbness and tingling in her legs. She denies loss of bowel or bladder. She denies any injury to the area. She has not tried anything OTC for this.   She is also concerned about her thyroid. Someone told her that her neck looks swollen. She denies difficulty swallowing. She has noticed fatigue, weight gain. She has no energy and feels tired all the time. She sleeps at least 8 hours at night. She does not feel depressed. She is anxious but she feels like it is controlled on Lexapro. She denies dry skin, constipation, depression, cold intolerance.  She really concerned about weight gain. She reports she has gained 15 lbs in the last 6 months. She denies changes in her diet. She is a vegetarian. She reports she has not increased her carb intake. She does not drink beer. She is not really exercising.      Review of Systems  No past medical history on file.  Current Outpatient Prescriptions  Medication Sig Dispense Refill  . azithromycin (ZITHROMAX) 250 MG tablet Take 2 tabs today, then 1 tab daily x 4 days 6 tablet 0  . escitalopram (LEXAPRO) 10 MG tablet Take 1 tablet (10 mg total) by mouth daily. (Patient taking differently: Take 5 mg by mouth daily. ) 90 tablet 3  . HYDROcodone-homatropine (HYCODAN) 5-1.5 MG/5ML syrup Take 5 mLs by mouth every 8 (eight) hours as needed for cough. 120 mL 0  . LARIN 1/20 1-20 MG-MCG tablet Take 1 tablet by mouth daily.  11  . [DISCONTINUED] traZODone (DESYREL) 50 MG tablet Take 1 tablet (50 mg total) by mouth at bedtime. 30 tablet 0   No current facility-administered medications for this visit.    Allergies  Allergen Reactions  . Amoxicillin     REACTION: rash    Family History  Problem Relation  Age of Onset  . Hypertension Mother   . Thyroid disease Mother   . Hypertension Maternal Aunt   . Hypertension Maternal Uncle   . Heart disease Maternal Grandmother   . Cancer Maternal Grandmother     Breast    Social History   Social History  . Marital Status: Single    Spouse Name: N/A  . Number of Children: N/A  . Years of Education: N/A   Occupational History  . Not on file.   Social History Main Topics  . Smoking status: Never Smoker   . Smokeless tobacco: Never Used  . Alcohol Use: 0.0 oz/week    0 Standard drinks or equivalent per week     Comment: occasional  . Drug Use: No  . Sexual Activity: Yes    Birth Control/ Protection: Pill   Other Topics Concern  . Not on file   Social History Narrative     Constitutional: Pt reports fatigue and weight gain. Denies fever, malaise, headache.  HEENT: Denies eye pain, eye redness, ear pain, ringing in the ears, wax buildup, runny nose, nasal congestion, bloody nose, or sore throat. Respiratory: Denies difficulty breathing, shortness of breath, cough or sputum production.   Cardiovascular: Denies chest pain, chest tightness, palpitations or swelling in the hands or feet.  Gastrointestinal: Denies abdominal pain, bloating, constipation, diarrhea or blood in  the stool.  Musculoskeletal: Pt reports back pain, Denies decrease in range of motion, difficulty with gait, muscle pain or joint swelling.  Skin: Denies redness, rashes, lesions or ulcercations.  Neurological: Pt reports numbness and tingling in her legs. Denies dizziness, difficulty with memory, difficulty with speech or problems with balance and coordination.  Psych: Pt has history of anxiety. Denies depression, SI/HI.  No other specific complaints in a complete review of systems (except as listed in HPI above).     Objective:   Physical Exam   BP 122/84 mmHg  Pulse 86  Temp(Src) 98.2 F (36.8 C) (Oral)  Wt 178 lb (80.74 kg)  SpO2 98% Wt Readings from  Last 3 Encounters:  04/02/16 178 lb (80.74 kg)  10/01/15 163 lb (73.936 kg)  09/04/15 160 lb (72.576 kg)    General: Appears her stated age, well developed, well nourished in NAD. Neck: No adenopathy. Thyroid not enlarged. Cardiovascular: Normal rate and rhythm. S1,S2 noted.  No murmur, rubs or gallops noted.  Pulmonary/Chest: Normal effort and positive vesicular breath sounds. No respiratory distress. No wheezes, rales or ronchi noted.  Musculoskeletal: Normal flexion, extension and rotation of the spine. No bony tenderness noted of the spine. No pain with palpation of the paraspinal muscles. Strength 5/5 BLE. No difficulty with gait.  Neurological: Alert and oriented. Sensation intact to BLE. Psychiatric: Mood and affect normal. Behavior is normal. Judgment and thought content normal.       Assessment & Plan:   Low back pain with sciatica:  Advised her to take Ibuprofen OTC Xray of lumbar spine today Stretching exercises given May need to follow up with MRI  Fatigue, weight gain:  Will check CBC, CMET, TSH, Vit D, B12 and Folate Encouraged her to start exercising If labs are normal, consider increasing Lexapro  RTC as needed or if symptoms persist or worsen Joleah Kosak, NP

## 2016-04-03 ENCOUNTER — Encounter: Payer: Self-pay | Admitting: Internal Medicine

## 2016-04-03 ENCOUNTER — Ambulatory Visit: Payer: Self-pay | Admitting: Family Medicine

## 2016-05-07 ENCOUNTER — Ambulatory Visit: Payer: Self-pay | Admitting: Primary Care

## 2016-05-07 DIAGNOSIS — Z0289 Encounter for other administrative examinations: Secondary | ICD-10-CM

## 2016-05-08 ENCOUNTER — Telehealth: Payer: Self-pay | Admitting: Internal Medicine

## 2016-05-08 NOTE — Telephone Encounter (Signed)
Patient did not come in for their appointment today for  Pink eye. Please let me know if patient needs to be contacted immediately for follow up or no follow up needed.

## 2016-05-08 NOTE — Telephone Encounter (Signed)
No immediate follow up necessary. 

## 2016-05-21 ENCOUNTER — Ambulatory Visit: Payer: Self-pay | Admitting: Family Medicine

## 2016-05-21 ENCOUNTER — Telehealth: Payer: Self-pay | Admitting: Internal Medicine

## 2016-05-21 DIAGNOSIS — Z0289 Encounter for other administrative examinations: Secondary | ICD-10-CM

## 2016-05-21 NOTE — Telephone Encounter (Signed)
Patient did not come in for their appointment today for back pain .  Please let me know if patient needs to be contacted immediately for follow up or no follow up needed.

## 2016-05-22 NOTE — Telephone Encounter (Signed)
No follow up needed

## 2016-05-26 ENCOUNTER — Ambulatory Visit: Payer: Self-pay | Admitting: Internal Medicine

## 2016-05-26 ENCOUNTER — Ambulatory Visit: Payer: Self-pay | Admitting: Family Medicine

## 2016-05-26 DIAGNOSIS — Z0289 Encounter for other administrative examinations: Secondary | ICD-10-CM

## 2016-05-31 ENCOUNTER — Emergency Department (INDEPENDENT_AMBULATORY_CARE_PROVIDER_SITE_OTHER)
Admission: EM | Admit: 2016-05-31 | Discharge: 2016-05-31 | Disposition: A | Discharge status: Home / Self Care | Payer: 59 | Source: Home / Self Care | Attending: Family Medicine | Admitting: Family Medicine

## 2016-05-31 ENCOUNTER — Encounter: Payer: Self-pay | Admitting: Emergency Medicine

## 2016-05-31 DIAGNOSIS — L03032 Cellulitis of left toe: Secondary | ICD-10-CM

## 2016-05-31 MED ORDER — DOXYCYCLINE HYCLATE 100 MG PO CAPS: 100.0000 mg | ORAL_CAPSULE | Freq: Two times a day (BID) | ORAL | 0 refills | Status: DC

## 2016-05-31 NOTE — ED Triage Notes (Signed)
Reports pain in left large toe for past 3 days; no injury; feels that it is infected.

## 2016-05-31 NOTE — Discharge Instructions (Addendum)
Soak toe in warm water (with epsom salt if desired) 2 or 3 times daily.  Keep toe bandaged until healed.

## 2016-05-31 NOTE — ED Provider Notes (Signed)
[facility]    CSN: [id number] Arrival date & time:     First Provider Contact:  First MD Initiated Contact with Patient 05/31/16 1543        History   Chief Complaint Chief Complaint  Patient presents with  . Toe Pain    HPI Sherri Harris is a 20 y.o. female.   Patient complains of 3 day history of pain and swelling in her left great toe, and believes that she has a toenail infection.   The history is provided by the patient and a parent.  Toe Pain  This is a new problem. Episode onset: 3 days ago. The problem occurs constantly. The problem has been gradually worsening. Associated symptoms comments:  . The symptoms are aggravated by walking. Nothing relieves the symptoms. She has tried nothing for the symptoms.    History reviewed. No pertinent past medical history.  Patient Active Problem List   Diagnosis Date Noted  . Generalized anxiety disorder 05/30/2014    Past Surgical History:  Procedure Laterality Date  . TYMPANOSTOMY TUBE PLACEMENT    . WISDOM TOOTH EXTRACTION      OB History    No data available       Home Medications    Prior to Admission medications   Medication Sig Start Date End Date Taking? Authorizing Provider  doxycycline (VIBRAMYCIN) 100 MG capsule Take 1 capsule (100 mg total) by mouth 2 (two) times daily. Take with food. 05/31/16   Lattie HawStephen A Beese, MD  escitalopram (LEXAPRO) 10 MG tablet Take 1 tablet (10 mg total) by mouth daily. 09/04/15   Lorre Munroeegina W Baity, NP  Elmarie ShileyLARIN 1/20 1-20 MG-MCG tablet Take 1 tablet by mouth daily. 06/21/15   Historical Provider, MD    Family History Family History  Problem Relation Age of Onset  . Hypertension Mother   . Thyroid disease Mother   . Hypertension Maternal Aunt   . Hypertension Maternal Uncle   . Heart disease Maternal Grandmother   . Cancer Maternal Grandmother     Breast    Social History Social History  Substance Use Topics  . Smoking status: Never Smoker  . Smokeless  tobacco: Never Used  . Alcohol use 0.0 oz/week     Comment: occasional     Allergies   Amoxicillin and Penicillins   Review of Systems Review of Systems  All other systems reviewed and are negative.    Physical Exam Triage Vital Signs ED Triage Vitals  Enc Vitals Group     BP      Pulse      Resp      Temp      Temp src      SpO2      Weight      Height      Head Circumference      Peak Flow      Pain Score      Pain Loc      Pain Edu?      Excl. in GC?    No data found.   Updated Vital Signs BP 118/87 (BP Location: Left Arm)   Pulse 96   Temp 98.2 F (36.8 C) (Oral)   Resp 16   Ht 5' 9.5" (1.765 m)   Wt 170 lb (77.1 kg)   LMP 05/30/2016 (Exact Date)   SpO2 99%   BMI 24.74 kg/m   Visual Acuity Right Eye Distance:   Left Eye Distance:   Bilateral Distance:  Right Eye Near:   Left Eye Near:    Bilateral Near:     Physical Exam  Constitutional: She appears well-developed and well-nourished. No distress.  HENT:  Head: Normocephalic.  Eyes: Pupils are equal, round, and reactive to light.  Cardiovascular: Normal rate.   Pulmonary/Chest: Effort normal.  Musculoskeletal:       Left foot: There is tenderness and swelling. There is normal range of motion and normal capillary refill.       Feet:  The medial aspect of her left great toenail is erythematous, slightly swollen, and tender to palpation.  Distal neurovascular function is intact.   Skin: Skin is warm and dry. No rash noted.  Nursing note and vitals reviewed.    UC Treatments / Results  Labs (all labs ordered are listed, but only abnormal results are displayed) Labs Reviewed - No data to display  EKG  EKG Interpretation None       Radiology No results found.  Procedures Procedures (including critical care time)  Medications Ordered in UC Medications - No data to display   Initial Impression / Assessment and Plan / UC Course  I have reviewed the triage vital signs and  the nursing notes.  Pertinent labs & imaging results that were available during my care of the patient were reviewed by me and considered in my medical decision making (see chart for details).  Clinical Course    Begin doxycycline 100mg  BID for staph coverage. Soak toe in warm water (with epsom salt if desired) 2 or 3 times daily.  Keep toe bandaged until healed. Return if not improved in 5 to 7 days.     Final Clinical Impressions(s) / UC Diagnoses   Final diagnoses:  Paronychia of great toe of left foot    New Prescriptions Discharge Medication List as of 05/31/2016  3:53 PM    START taking these medications   Details  doxycycline (VIBRAMYCIN) 100 MG capsule Take 1 capsule (100 mg total) by mouth 2 (two) times daily. Take with food., Starting Sat 05/31/2016, Print         Lattie HawStephen A Beese, MD 05/31/16 [phone]

## 2016-06-05 ENCOUNTER — Encounter: Payer: Self-pay | Admitting: Podiatry

## 2016-06-05 ENCOUNTER — Ambulatory Visit (INDEPENDENT_AMBULATORY_CARE_PROVIDER_SITE_OTHER): Payer: 59 | Admitting: Podiatry

## 2016-06-05 DIAGNOSIS — L03032 Cellulitis of left toe: Secondary | ICD-10-CM

## 2016-06-05 DIAGNOSIS — L600 Ingrowing nail: Secondary | ICD-10-CM

## 2016-06-05 DIAGNOSIS — L02612 Cutaneous abscess of left foot: Secondary | ICD-10-CM

## 2016-06-05 MED ORDER — DOXYCYCLINE HYCLATE 100 MG PO CAPS: 100.0000 mg | ORAL_CAPSULE | Freq: Two times a day (BID) | ORAL | 0 refills | Status: DC

## 2016-06-05 MED FILL — DOXYCYCLINE HYCLATE 100 MG: 100 | 10 days supply | Qty: 20 | Fill #0

## 2016-06-05 NOTE — Progress Notes (Signed)
   Subjective:    Patient ID: Sherri Harris, female    DOB: 03/13/1996, 20 y.o.   MRN: [id number]  HPI  20 year old female presents the office today with mom for concerns of ingrown toenails a left big toe which is been ongoing for about 2 weeks. He would urgent care about 6 days ago she was given doxycycline but since then she has had progressive redness and pus coming from the toenail as well as pain. She has been unable to work because of the pain. There is painful with pressure in shoe gear. She states the toenail starting to smell as well. No other complaints at this time.  Review of Systems  All other systems reviewed and are negative.      Objective:   Physical Exam General: AAO x3, NAD  Dermatological: There is incurvation of both the medial and lateral nail border left hallux toenail the lateral side worse than the right. There is pus coming from the lateral nail border there is edema and erythema along the nail borders without any ascending synovitis. There is no fluctuance or crepitus. There is mild malodor coming from the toenail. No other open lesions or pre-ulcerative lesions.  Vascular: Dorsalis Pedis artery and Posterior Tibial artery pedal pulses are 2/4 bilateral with immedate capillary fill time.  There is no pain with calf compression, swelling, warmth, erythema.   Neruologic: Grossly intact via light touch bilateral. Vibratory intact via tuning fork bilateral. Protective threshold with Semmes Wienstein monofilament intact to all pedal sites bilateral.   Musculoskeletal: No gross boney pedal deformities bilateral. No pain, crepitus, or limitation noted with foot and ankle range of motion bilateral. Muscular strength 5/5 in all groups tested bilateral.  Gait: Unassisted, Nonantalgic.      Assessment & Plan:  20 year old female left hallux infected ingrown toenail -Treatment options discussed including all alternatives, risks, and complications -Etiology of symptoms  were discussed -At this time, recommended partial nail removal without chemical matricectomy to the medial and lateral due to infection. Risks and complications were discussed with the patient for which they understand and  verbally consent to the procedure. Under sterile conditions a total of 3 mL of a mixture of 2% lidocaine plain and 0.5% Marcaine plain was infiltrated in a hallux block fashion. Once anesthetized, the skin was prepped in sterile fashion. A tourniquet was then applied. Next the medial and lateral border of the hallux nail border was sharply excised making sure to remove the entire offending nail border. Once the nail was  Removed, the area was debrided and the underlying skin was intact. The area was irrigated and hemostasis was obtained.  A dry sterile dressing was applied. After application of the dressing the tourniquet was removed and there is found to be an immediate capillary refill time to the digit. The patient tolerated the procedure well any complications. Post procedure instructions were discussed the patient for which he verbally understood. Follow-up in one week for nail check or sooner if any problems are to arise. Discussed signs/symptoms of worsening infection and directed to call the office immediately should any occur or go directly to the emergency room. In the meantime, encouraged to call the office with any questions, concerns, changes symptoms. -Continue doxycycline  Ovid CurdMatthew Wagoner, DPM

## 2016-06-05 NOTE — Patient Instructions (Signed)

## 2016-06-12 ENCOUNTER — Ambulatory Visit: Payer: 59 | Admitting: Podiatry

## 2016-06-21 ENCOUNTER — Ambulatory Visit (INDEPENDENT_AMBULATORY_CARE_PROVIDER_SITE_OTHER): Payer: 59

## 2016-06-21 ENCOUNTER — Ambulatory Visit
Admission: EM | Admit: 2016-06-21 | Discharge: 2016-06-21 | Disposition: A | Discharge status: Home / Self Care | Payer: 59 | Attending: Registered Nurse | Admitting: Registered Nurse

## 2016-06-21 DIAGNOSIS — S9031XA Contusion of right foot, initial encounter: Secondary | ICD-10-CM

## 2016-06-21 DIAGNOSIS — S93601A Unspecified sprain of right foot, initial encounter: Secondary | ICD-10-CM

## 2016-06-21 DIAGNOSIS — R262 Difficulty in walking, not elsewhere classified: Secondary | ICD-10-CM | POA: Diagnosis not present

## 2016-06-21 MED ORDER — ACETAMINOPHEN 500 MG PO TABS: 1000.0000 mg | ORAL_TABLET | Freq: Four times a day (QID) | ORAL | 0 refills | Status: DC | PRN

## 2016-06-21 NOTE — ED Provider Notes (Signed)
CSN: [id number]     Arrival date & time 06/21/16  1437 History   First MD Initiated Contact with Patient 06/21/16 1453     Chief Complaint  Patient presents with  . Foot Pain    Right Foot   (Consider location/radiation/quality/duration/timing/severity/associated sxs/prior Treatment) Single caucasian female here for evaluation right midfoot pain after falling in ditch last night.  Was able to stand up and walk last night getting out of bed this morning did not want to bear weight due to pain/limping.  Has tried elevation no meds. No ice.  Had infected ingrown toenail last month healed.  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  Wearing flip flops today.      History reviewed. No pertinent past medical history. Past Surgical History:  Procedure Laterality Date  . TYMPANOSTOMY TUBE PLACEMENT    . WISDOM TOOTH EXTRACTION     Family History  Problem Relation Age of Onset  . Hypertension Mother   . Thyroid disease Mother   . Hypertension Maternal Aunt   . Hypertension Maternal Uncle   . Heart disease Maternal Grandmother   . Cancer Maternal Grandmother     Breast   Social History  Substance Use Topics  . Smoking status: Never Smoker  . Smokeless tobacco: Never Used  . Alcohol use 0.0 oz/week     Comment: occasional   OB History    No data available     Review of Systems  Constitutional: Negative for chills and fever.  HENT: Negative for ear pain and sore throat.   Eyes: Negative for pain and visual disturbance.  Respiratory: Negative for cough and shortness of breath.   Cardiovascular: Negative for chest pain and palpitations.  Gastrointestinal: Negative for abdominal pain and vomiting.  Endocrine: Positive for polydipsia, polyphagia and polyuria.  Genitourinary: Negative for dysuria and hematuria.  Musculoskeletal: Positive for gait problem and myalgias. Negative for arthralgias, back pain, joint swelling, neck pain and neck stiffness.   Skin: Negative for color change, pallor, rash and wound.  Allergic/Immunologic: Positive for environmental allergies. Negative for food allergies.  Neurological: Negative for dizziness, tremors, seizures, syncope, facial asymmetry, speech difficulty, weakness, light-headedness, numbness and headaches.  Hematological: Negative for adenopathy. Does not bruise/bleed easily.  Psychiatric/Behavioral: Negative for sleep disturbance.  All other systems reviewed and are negative.   Allergies  Amoxicillin and Penicillins  Home Medications   Prior to Admission medications   Medication Sig Start Date End Date Taking? Authorizing Provider  acetaminophen (TYLENOL) 500 MG tablet Take 2 tablets (1,000 mg total) by mouth every 6 (six) hours as needed. 06/21/16   Barbaraann Barthel, NP  escitalopram (LEXAPRO) 10 MG tablet Take 1 tablet (10 mg total) by mouth daily. 09/04/15   Lorre Munroe, NP  Elmarie Shiley 1/20 1-20 MG-MCG tablet Take 1 tablet by mouth daily. 06/21/15   Historical Provider, MD   Meds Ordered and Administered this Visit  Medications - No data to display  BP 128/75 (BP Location: Left Arm)   Pulse 99   Temp 98 F (36.7 C) (Oral)   Resp 18   Ht 5\' 9"  (1.753 m)   Wt 170 lb (77.1 kg)   LMP 05/30/2016 (Exact Date)   SpO2 100%   BMI 25.10 kg/m  No data found.   Physical Exam  Constitutional: She is oriented to person, place, and time. Vital signs are normal. She appears well-developed and well-nourished. She is active and cooperative.  Non-toxic appearance. She does not  have a sickly appearance. She does not appear ill. No distress.  HENT:  Head: Normocephalic and atraumatic.  Right Ear: Hearing and external ear normal.  Left Ear: Hearing and external ear normal.  Nose: Nose normal.  Mouth/Throat: Uvula is midline, oropharynx is clear and moist and mucous membranes are normal. No oropharyngeal exudate.  Eyes: Conjunctivae, EOM and lids are normal. Pupils are equal, round, and reactive to  light. Right eye exhibits no discharge. Left eye exhibits no discharge.  Neck: Trachea normal, normal range of motion and phonation normal. Neck supple. No tracheal tenderness, no spinous process tenderness and no muscular tenderness present. No neck rigidity. No tracheal deviation, no edema, no erythema and normal range of motion present. No thyroid mass and no thyromegaly present.  Cardiovascular: Normal rate, regular rhythm and normal heart sounds.   No murmur heard. Pulses:      Dorsalis pedis pulses are 2+ on the right side, and 2+ on the left side.       Posterior tibial pulses are 2+ on the right side, and 2+ on the left side.  Pulmonary/Chest: Effort normal and breath sounds normal. No stridor. No respiratory distress. She has no decreased breath sounds. She has no wheezes. She has no rhonchi. She has no rales.  Abdominal: Soft. There is no tenderness.  Musculoskeletal: Normal range of motion. She exhibits tenderness. She exhibits no edema or deformity.       Right shoulder: Normal.       Left shoulder: Normal.       Right elbow: Normal.      Left elbow: Normal.       Right hip: Normal.       Left hip: Normal.       Right knee: Normal.       Left knee: Normal.       Right ankle: Normal.       Left ankle: Normal.       Cervical back: Normal.       Thoracic back: Normal.       Lumbar back: Normal.       Right hand: Normal.       Left hand: Normal.       Right foot: There is tenderness. There is normal range of motion, no bony tenderness, no swelling, normal capillary refill, no crepitus, no deformity and no laceration.       Left foot: Normal.  Lymphadenopathy:    She has no cervical adenopathy.  Neurological: She is alert and oriented to person, place, and time. She displays normal reflexes. She exhibits normal muscle tone. Coordination normal.  Skin: Skin is warm and dry. Capillary refill takes less than 2 seconds. No rash noted. She is not diaphoretic. No erythema. No pallor.   Psychiatric: She has a normal mood and affect. Her behavior is normal. Judgment and thought content normal.  Nursing note and vitals reviewed.   Urgent Care Course   Clinical Course    Procedures (including critical care time)  Labs Review Labs Reviewed - No data to display  Imaging Review Dg Foot Complete Right  Result Date: 06/21/2016 CLINICAL DATA:  Stepped in ditch last night now with difficulty weight-bearing EXAM: RIGHT FOOT COMPLETE - 3+ VIEW COMPARISON:  None. FINDINGS: There is no evidence of fracture or dislocation. There is no evidence of arthropathy or other focal bone abnormality. Soft tissues are unremarkable. IMPRESSION: Negative. Electronically Signed   By: Signa Kell M.D.   On: 06/21/2016 15:23  1500 ice pack applied right foot pending foot xray  Patient to xray via wheelchair  1540 discussed negative fracture/dislocation xray results with patient.  Crutches sized and distributed from clinic stock by RN Jacki ConesLaurie to patient and crutch use/demonstration provided by [employer] Laurie.  Patient given copy of radiology report.  Patient refused work note.  Patient verbalized understanding information/instructions, agreed with plan of care and had no further questions at this time.   MDM   1. Foot contusion, right, initial encounter   2. Foot sprain, right, initial encounter     Patient was instructed to rest, ice and elevate the right foot as much as possible.  Activity as tolerated and work on ROM exercises. Crutches prn this week discussed with patient to prevent other foot/ankle/knee/hip injuries from altered gait use crutches.  Discussed motrin/naproxen could interact with her chronic medications recommended tylenol 1000mg  po QID prn pain.  Discussed at risk to reinjure foot over the next year and to wear supportive footwear/compressive sleeve/ace bandage.  Follow up with PCM if symptoms persist greater than 4 weeks for re-evaluation/reimaging.  Patient verbalized  agreement and understanding of treatment plan and had no further questions at this time.   P2:  Injury Prevention and Fitness.    Barbaraann Barthelina A Elinda Bunten, NP 06/21/16 2047

## 2016-06-21 NOTE — ED Triage Notes (Signed)
Patient fell in a ditch last night around midnight and her foot/ankle on the right side is hurting.

## 2016-07-16 ENCOUNTER — Ambulatory Visit: Payer: Self-pay | Admitting: Family Medicine

## 2016-07-23 ENCOUNTER — Other Ambulatory Visit: Payer: Self-pay

## 2016-07-23 ENCOUNTER — Ambulatory Visit: Payer: 59 | Admitting: Family Medicine

## 2016-07-23 DIAGNOSIS — Z309 Encounter for contraceptive management, unspecified: Secondary | ICD-10-CM | POA: Insufficient documentation

## 2016-07-23 DIAGNOSIS — Z01419 Encounter for gynecological examination (general) (routine) without abnormal findings: Secondary | ICD-10-CM | POA: Insufficient documentation

## 2016-07-23 MED ORDER — LARIN 1/20 1-20 MG-MCG PO TABS: 1.0000 | ORAL_TABLET | Freq: Every day | ORAL | 2 refills | Status: DC

## 2016-07-23 MED FILL — LARIN 21 1-20 TABLET: 1-20 | 28 days supply | Qty: 21 | Fill #0

## 2016-07-23 NOTE — Telephone Encounter (Signed)
Ok to refill lo loestrin until CPX can be made

## 2016-07-23 NOTE — Telephone Encounter (Signed)
Patient's mother said if there are any questions, to please call her at [phone].  Patient will be at work.

## 2016-07-23 NOTE — Progress Notes (Deleted)
   Subjective:   Patient ID: Sherri Harris, female    DOB: 08/03/1996, 20 y.o.   MRN: [id number]  Sherri SheererHeather E Shores is a pleasant 20 y.o. year old female pt new to me, who presents to clinic today with No chief complaint on file.  on 07/23/2016  HPI:  She is sexually active with 1 partner. No protection. On OCP. G0P0.   Current Outpatient Prescriptions on File Prior to Visit  Medication Sig Dispense Refill  . acetaminophen (TYLENOL) 500 MG tablet Take 2 tablets (1,000 mg total) by mouth every 6 (six) hours as needed. 30 tablet 0  . escitalopram (LEXAPRO) 10 MG tablet Take 1 tablet (10 mg total) by mouth daily. 90 tablet 3  . LARIN 1/20 1-20 MG-MCG tablet Take 1 tablet by mouth daily.  11  . [DISCONTINUED] traZODone (DESYREL) 50 MG tablet Take 1 tablet (50 mg total) by mouth at bedtime. 30 tablet 0   No current facility-administered medications on file prior to visit.     Allergies  Allergen Reactions  . Amoxicillin     REACTION: rash  . Penicillins     No past medical history on file.  Past Surgical History:  Procedure Laterality Date  . TYMPANOSTOMY TUBE PLACEMENT    . WISDOM TOOTH EXTRACTION      Family History  Problem Relation Age of Onset  . Hypertension Mother   . Thyroid disease Mother   . Hypertension Maternal Aunt   . Hypertension Maternal Uncle   . Heart disease Maternal Grandmother   . Cancer Maternal Grandmother     Breast    Social History   Social History  . Marital status: Single    Spouse name: N/A  . Number of children: N/A  . Years of education: N/A   Occupational History  . Not on file.   Social History Main Topics  . Smoking status: Never Smoker  . Smokeless tobacco: Never Used  . Alcohol use 0.0 oz/week     Comment: occasional  . Drug use: No  . Sexual activity: Yes    Birth control/ protection: Pill   Other Topics Concern  . Not on file   Social History Narrative  . No narrative on file   The PMH, PSH, Social History,  Family History, Medications, and allergies have been reviewed in [facility], [facility], and have been updated if relevant.  Review of Systems     Objective:    There were no vitals taken for this visit.   Physical Exam        Assessment & Plan:   Encounter for gynecological examination without abnormal finding  Encounter for initial prescription of contraceptives, unspecified contraceptive No Follow-up on file.

## 2016-07-23 NOTE — Addendum Note (Signed)
Addended by: Roena MaladyEVONTENNO, MELANIE Y on: 07/23/2016 05:20 PM   Modules accepted: Orders

## 2016-07-23 NOTE — Telephone Encounter (Signed)
Patient's mother is requesting prescription be called in for Lo Loestrin FE. [facility] on [address]

## 2016-07-23 NOTE — Telephone Encounter (Signed)
Pt came in today to have CPE with Dr Dayton MartesAron, but after Dr Dayton MartesAron reviewed it was recognized that pt was not her pt.... Pt states that her GYN has been filling her OCP but they are requiring her to have her Pap done first... Pt wants to start coming to PCP for Pap and refills---last CPE was 09/04/2015---I suggested that pt schedule annual exam for after 09/04/2015 with pap and may can send in refill only until appointment---please advise if you are okay with this arrangement

## 2016-07-25 MED ORDER — NORETHIN-ETH ESTRAD-FE BIPHAS 1 MG-10 MCG / 10 MCG PO TABS: 1.0000 | ORAL_TABLET | Freq: Every day | ORAL | 11 refills | Status: DC

## 2016-07-25 MED FILL — LO LOESTRIN FE 1-10 TABLET: 1 MG-10 MCG | 84 days supply | Qty: 84 | Fill #0

## 2016-07-25 NOTE — Telephone Encounter (Signed)
Sherri Harris pts mom said pt needs Low [facility]. Lo Estrin made her feel "bad" and stopped period. Sherri Harris said only BC pill that worked for her is Low Lo Kohl'sEstrin. Pamala Hurry Baity NP out of office today. Please advise.

## 2016-07-25 NOTE — Telephone Encounter (Signed)
Left message on voicemail.

## 2016-07-25 NOTE — Telephone Encounter (Signed)
Warn pt that breakthough bleeding is common on this med as it has a very low dose estrogen. She should wear a panty liner daily until she know whether she has this issue or not.

## 2016-07-25 NOTE — Addendum Note (Signed)
Addended by: Kerby NoraBEDSOLE, AMY E on: 07/25/2016 09:31 AM   Modules accepted: Orders

## 2016-08-22 ENCOUNTER — Ambulatory Visit (INDEPENDENT_AMBULATORY_CARE_PROVIDER_SITE_OTHER): Payer: 59 | Admitting: Family Medicine

## 2016-08-22 VITALS — BP 141/91 | HR 93 | Temp 98.3°F | Resp 16 | Wt 175.0 lb

## 2016-08-22 DIAGNOSIS — N3001 Acute cystitis with hematuria: Secondary | ICD-10-CM | POA: Diagnosis not present

## 2016-08-22 DIAGNOSIS — R350 Frequency of micturition: Secondary | ICD-10-CM

## 2016-08-22 DIAGNOSIS — I10 Essential (primary) hypertension: Secondary | ICD-10-CM

## 2016-08-22 DIAGNOSIS — R102 Pelvic and perineal pain: Secondary | ICD-10-CM

## 2016-08-22 LAB — POCT URINALYSIS DIPSTICK
Bilirubin, UA: NEGATIVE
Glucose, UA: NEGATIVE
Ketones, UA: NEGATIVE
Leukocytes, UA: NEGATIVE
NITRITE UA: NEGATIVE
PROTEIN UA: NEGATIVE
SPEC GRAV UA: 1.02
UROBILINOGEN UA: 0.2
pH, UA: 5

## 2016-08-22 LAB — WET PREP, GENITAL
Clue Cells Wet Prep HPF POC: NONE SEEN
TRICH WET PREP: NONE SEEN
YEAST WET PREP: NONE SEEN

## 2016-08-22 MED ORDER — NITROFURANTOIN MONOHYD MACRO 100 MG PO CAPS: 100.0000 mg | ORAL_CAPSULE | Freq: Two times a day (BID) | ORAL | 0 refills | Status: DC

## 2016-08-22 NOTE — Progress Notes (Signed)
   Subjective:    Patient ID: Sherri Harris, female    DOB: 11/26/1995, 20 y.o.   MRN: [id number]  HPI  Sherri SetaHeather complains of pelvic pain and frequent urination. She states she does drink a lot of water. She has had pelvic pain in the past and was diagnosed with a UTI. The first day of her last menstrual cycle was 08/13/2016, lasting 7 days. Denies vaginal discharge, fever, chills, sweats or back pain.   Review of Systems  Genitourinary: Positive for frequency and pelvic pain. Negative for decreased urine volume, difficulty urinating, dyspareunia, dysuria, enuresis, flank pain, genital sores, hematuria, menstrual problem, urgency, vaginal bleeding, vaginal discharge and vaginal pain.       Objective:   Physical Exam        Assessment & Plan:  Frequent urination - UA dip positive for moderate blood - will send for a urine culture to confirm  Pelvic pain- Stat- wet prep pending- Patient advised to go to Urgent Care or the ED if pain worsens has fever, chills or sweats.   Elevated blood pressure - Advised to follow up with PCP for elevated blood pressure.

## 2016-08-22 NOTE — Patient Instructions (Signed)
Thank you for coming in today. I think this is a UTI.  Take nitrofurantion twice daily for UTI.  Make sure to follow up with your primary doctor about your blood pressure.  Call or go to the emergency room if you get worse, have trouble breathing, have chest pains, or palpitations.  If your belly pain worsens, or you have high fever, bad vomiting, blood in your stool or black tarry stool go to the Emergency Room.    Urinary Tract Infection Urinary tract infections (UTIs) can develop anywhere along your urinary tract. Your urinary tract is your body's drainage system for removing wastes and extra water. Your urinary tract includes two kidneys, two ureters, a bladder, and a urethra. Your kidneys are a pair of bean-shaped organs. Each kidney is about the size of your fist. They are located below your ribs, one on each side of your spine. CAUSES Infections are caused by microbes, which are microscopic organisms, including fungi, viruses, and bacteria. These organisms are so small that they can only be seen through a microscope. Bacteria are the microbes that most commonly cause UTIs. SYMPTOMS  Symptoms of UTIs may vary by age and gender of the patient and by the location of the infection. Symptoms in young women typically include a frequent and intense urge to urinate and a painful, burning feeling in the bladder or urethra during urination. Older women and men are more likely to be tired, shaky, and weak and have muscle aches and abdominal pain. A fever may mean the infection is in your kidneys. Other symptoms of a kidney infection include pain in your back or sides below the ribs, nausea, and vomiting. DIAGNOSIS To diagnose a UTI, your caregiver will ask you about your symptoms. Your caregiver will also ask you to provide a urine sample. The urine sample will be tested for bacteria and white blood cells. White blood cells are made by your body to help fight infection. TREATMENT  Typically, UTIs can be  treated with medication. Because most UTIs are caused by a bacterial infection, they usually can be treated with the use of antibiotics. The choice of antibiotic and length of treatment depend on your symptoms and the type of bacteria causing your infection. HOME CARE INSTRUCTIONS  If you were prescribed antibiotics, take them exactly as your caregiver instructs you. Finish the medication even if you feel better after you have only taken some of the medication.  Drink enough water and fluids to keep your urine clear or pale yellow.  Avoid caffeine, tea, and carbonated beverages. They tend to irritate your bladder.  Empty your bladder often. Avoid holding urine for long periods of time.  Empty your bladder before and after sexual intercourse.  After a bowel movement, women should cleanse from front to back. Use each tissue only once. SEEK MEDICAL CARE IF:   You have back pain.  You develop a fever.  Your symptoms do not begin to resolve within 3 days. SEEK IMMEDIATE MEDICAL CARE IF:   You have severe back pain or lower abdominal pain.  You develop chills.  You have nausea or vomiting.  You have continued burning or discomfort with urination. MAKE SURE YOU:   Understand these instructions.  Will watch your condition.  Will get help right away if you are not doing well or get worse.   This information is not intended to replace advice given to you by your health care provider. Make sure you discuss any questions you have with your  health care provider.   Document Released: 07/09/2005 Document Revised: 06/20/2015 Document Reviewed: 11/07/2011 Elsevier Interactive Patient Education [employer].

## 2016-08-22 NOTE — Progress Notes (Signed)
Sherri Harris is a 20 y.o. female [facility] today for urinary tract infection. Patient has urinary frequency urgency and dysuria with mild pelvic pain. Her current symptoms are consistent with previous episodes of UTI. No fevers or chills.  Elevated blood pressure: Patient has had several measurements now with elevated blood pressure. She has a positive family history significant for hypertension. She has never been diagnosed previously with hypertension.   No past medical history on file. Past Surgical History:  Procedure Laterality Date  . TYMPANOSTOMY TUBE PLACEMENT    . WISDOM TOOTH EXTRACTION     Social History  Substance Use Topics  . Smoking status: Never Smoker  . Smokeless tobacco: Never Used  . Alcohol use 0.0 oz/week     Comment: occasional   family history includes Cancer in her maternal grandmother; Heart disease in her maternal grandmother; Hypertension in her maternal aunt, maternal uncle, and mother; Thyroid disease in her mother.  ROS as above:  Medications: Current Outpatient Prescriptions  Medication Sig Dispense Refill  . Norethindrone-Ethinyl Estradiol-Fe Biphas (LO LOESTRIN FE) 1 MG-10 MCG / 10 MCG tablet Take 1 tablet by mouth daily. 1 Package 11  . nitrofurantoin, macrocrystal-monohydrate, (MACROBID) 100 MG capsule Take 1 capsule (100 mg total) by mouth 2 (two) times daily. 14 capsule 0   No current facility-administered medications for this visit.    Allergies  Allergen Reactions  . Amoxicillin Rash    REACTION: rash  . Penicillins Rash    Health Maintenance Health Maintenance  Topic Date Due  . CHLAMYDIA SCREENING  09/18/2009  . PAP SMEAR  09/19/2015  . INFLUENZA VACCINE  05/13/2016  . TETANUS/TDAP  03/04/2017  . HIV Screening  Addressed     Exam:  BP (!) 141/91   Pulse 93   Temp 98.3 F (36.8 C)  (Oral)   Resp 16   Wt 175 lb (79.4 kg)   LMP 08/13/2016 (Exact Date)   SpO2 100%   BMI 25.84 kg/m  Gen: Well NAD HEENT: EOMI,  MMM Lungs: Normal work of breathing. CTABL Heart: RRR no MRG Abd: NABS, Soft. Nondistended, Nontender Exts: Brisk capillary refill, warm and well perfused.    Results for orders placed or performed in visit on 08/22/16 (from the past 72 hour(s))  POCT Urinalysis Dipstick     Status: Normal   Collection Time: 08/22/16  1:58 PM  Result Value Ref Range   Color, UA yellow    Clarity, UA clear    Glucose, UA negative    Bilirubin, UA negative    Ketones, UA negative    Spec Grav, UA 1.020    Blood, UA Moderate     Comment: last LMP 08/13/2016   pH, UA 5.0    Protein, UA negative    Urobilinogen, UA 0.2    Nitrite, UA negative    Leukocytes, UA Negative Negative  Wet prep, genital     Status: None   Collection Time: 08/22/16  2:07 PM  Result Value Ref Range   Yeast Wet Prep HPF POC NONE SEEN NONE SEEN   Trich, Wet Prep NONE SEEN NONE SEEN   Clue Cells Wet Prep HPF POC NONE SEEN NONE SEEN   WBC, Wet Prep HPF POC FEW NONE SEEN   No results found.    Assessment and Plan: 20 y.o. female with  Urinary tract infection: Trace blood plus symptoms are enough for me to treat.  Will treat with nitrofurantoin given patient's allergies to penicillin. Urine culture pending.  Hypertension: Diagnosed today with multiple measurements. All of with PCP for further assessment. Patient does not have a lot of reasons to have hypertension and I recommend searching for secondary causes.   Orders Placed This Encounter  Procedures  . Urine Culture  . Wet prep, genital  . POCT Urinalysis Dipstick    Discussed warning signs or symptoms. Please see discharge instructions. Patient expresses understanding.

## 2016-08-24 LAB — URINE CULTURE

## 2016-08-25 ENCOUNTER — Ambulatory Visit (INDEPENDENT_AMBULATORY_CARE_PROVIDER_SITE_OTHER): Payer: 59 | Admitting: Sports Medicine

## 2016-08-25 ENCOUNTER — Encounter: Payer: Self-pay | Admitting: Sports Medicine

## 2016-08-25 ENCOUNTER — Ambulatory Visit (INDEPENDENT_AMBULATORY_CARE_PROVIDER_SITE_OTHER): Payer: 59

## 2016-08-25 DIAGNOSIS — M40202 Unspecified kyphosis, cervical region: Secondary | ICD-10-CM

## 2016-08-25 DIAGNOSIS — M4050 Lordosis, unspecified, site unspecified: Secondary | ICD-10-CM | POA: Diagnosis not present

## 2016-08-25 NOTE — Progress Notes (Signed)
   Subjective:    I'm seeing this patient as a consultation for:  Nicki Reaperegina Baity, NP  CC: Neck deformity  HPI: For years this pleasant 20 year old female has noted an increasing hump at the base of her neck. Her mother has the same deformity. Only minimal pain occasionally. She's concerned that it's increasing and really just wants to know what it is. No radicular pain, no weakness. No trauma. No constitutional symptoms.  Past medical history:  Negative.  See flowsheet/record as well for more information.  Surgical history: Negative.  See flowsheet/record as well for more information.  Family history: Negative.  See flowsheet/record as well for more information.  Social history: Negative.  See flowsheet/record as well for more information.  Allergies, and medications have been entered into the medical record, reviewed, and no changes needed.   Review of Systems: No headache, visual changes, nausea, vomiting, diarrhea, constipation, dizziness, abdominal pain, skin rash, fevers, chills, night sweats, weight loss, swollen lymph nodes, body aches, joint swelling, muscle aches, chest pain, shortness of breath, mood changes, visual or auditory hallucinations.   Objective:   General: Well Developed, well nourished, and in no acute distress.  Neuro/Psych: Alert and oriented x3, extra-ocular muscles intact, able to move all 4 extremities, sensation grossly intact. Skin: Warm and dry, no rashes noted.  Respiratory: Not using accessory muscles, speaking in full sentences, trachea midline.  Cardiovascular: Pulses palpable, no extremity edema. Abdomen: Does not appear distended. Neck: Negative spurling's Extremely Dowager's hump Full neck range of motion Grip strength and sensation normal in bilateral hands Strength good C4 to T1 distribution No sensory change to C4 to T1 Reflexes normal  Impression and Recommendations:   This case required medical decision making of moderate  complexity.  Dowager's Hump/Cervical Kyphosis Extremely mild, no intervention has been proven successful at reversing this however no intervention needed. We will get a baseline x-ray, and some formal physical therapy. She can use ibuprofen as needed.

## 2016-08-25 NOTE — Assessment & Plan Note (Signed)
Extremely mild, no intervention has been proven successful at reversing this however no intervention needed. We will get a baseline x-ray, and some formal physical therapy. She can use ibuprofen as needed.

## 2016-09-08 ENCOUNTER — Encounter: Payer: Self-pay | Admitting: Internal Medicine

## 2016-09-08 ENCOUNTER — Ambulatory Visit (INDEPENDENT_AMBULATORY_CARE_PROVIDER_SITE_OTHER): Payer: 59 | Admitting: Internal Medicine

## 2016-09-08 VITALS — BP 134/96 | HR 101 | Temp 99.3°F | Ht 69.5 in | Wt 169.8 lb

## 2016-09-08 DIAGNOSIS — J069 Acute upper respiratory infection, unspecified: Secondary | ICD-10-CM | POA: Diagnosis not present

## 2016-09-08 DIAGNOSIS — Z0000 Encounter for general adult medical examination without abnormal findings: Secondary | ICD-10-CM | POA: Diagnosis not present

## 2016-09-08 DIAGNOSIS — B9789 Other viral agents as the cause of diseases classified elsewhere: Secondary | ICD-10-CM | POA: Diagnosis not present

## 2016-09-08 DIAGNOSIS — Z0001 Encounter for general adult medical examination with abnormal findings: Secondary | ICD-10-CM

## 2016-09-08 DIAGNOSIS — Z1159 Encounter for screening for other viral diseases: Secondary | ICD-10-CM

## 2016-09-08 LAB — LIPID PANEL
CHOLESTEROL: 188 mg/dL (ref 0–200)
HDL: 76 mg/dL (ref >39.00)
LDL CALC: 76 mg/dL (ref 0–99)
NONHDL: 112.25
Total CHOL/HDL Ratio: 2
Triglycerides: 179 mg/dL — ABNORMAL HIGH (ref 0.0–149.0)
VLDL: 35.8 mg/dL (ref 0.0–40.0)

## 2016-09-08 LAB — POCT INFLUENZA A/B
INFLUENZA A, POC: NEGATIVE
INFLUENZA B, POC: NEGATIVE

## 2016-09-08 NOTE — Progress Notes (Signed)
Subjective:    Patient ID: Sherri Harris, female    DOB: 10/26/1995, 20 y.o.   MRN: [id number]  HPI  Pt presents to the clinic today for her annual exam.   Flu: 07/2016 Tetanus: 2008 Pap Smear: 2016 Dentist: biannually  Diet: She is vegan, no meat or byproducts. She consumes fruits and veggies daily. She does eat some fried food. She drinks mostly water and green tea Exercise: She goes to the gym 3-4 days a week for 1.5 hours.  Review of Systems      History reviewed. No pertinent past medical history.  Current Outpatient Prescriptions  Medication Sig Dispense Refill  . Norethindrone-Ethinyl Estradiol-Fe Biphas (LO LOESTRIN FE) 1 MG-10 MCG / 10 MCG tablet Take 1 tablet by mouth daily. 1 Package 11   No current facility-administered medications for this visit.     Allergies  Allergen Reactions  . Amoxicillin Rash    REACTION: rash  . Penicillins Rash    Family History  Problem Relation Age of Onset  . Hypertension Mother   . Thyroid disease Mother   . Hypertension Maternal Aunt   . Hypertension Maternal Uncle   . Heart disease Maternal Grandmother   . Cancer Maternal Grandmother     Breast    Social History   Social History  . Marital status: Single    Spouse name: N/A  . Number of children: N/A  . Years of education: N/A   Occupational History  . Not on file.   Social History Main Topics  . Smoking status: Never Smoker  . Smokeless tobacco: Never Used  . Alcohol use 0.0 oz/week     Comment: occasional  . Drug use: No  . Sexual activity: Yes    Birth control/ protection: Pill   Other Topics Concern  . Not on file   Social History Narrative  . No narrative on file     Constitutional: Pt reports headache and fever. Denies malaise, fatigue, headache or abrupt weight changes.  HEENT: Pt reports nasal congestion. Denies eye pain, eye redness, ear pain, ringing in the ears, wax buildup, runny nose, bloody nose, or sore throat. Respiratory:  Denies difficulty breathing, shortness of breath, cough or sputum production.   Cardiovascular: Denies chest pain, chest tightness, palpitations or swelling in the hands or feet.  Gastrointestinal: Denies abdominal pain, bloating, constipation, diarrhea or blood in the stool.  GU: Denies urgency, frequency, pain with urination, burning sensation, blood in urine, odor or discharge. Musculoskeletal: Denies decrease in range of motion, difficulty with gait, muscle pain or joint pain and swelling.  Skin: Denies redness, rashes, lesions or ulcercations.  Neurological: Denies dizziness, difficulty with memory, difficulty with speech or problems with balance and coordination.  Psych: Denies anxiety, depression, SI/HI.  No other specific complaints in a complete review of systems (except as listed in HPI above).  Objective:   Physical Exam   BP (!) 134/96   Pulse (!) 101   Temp 99.3 F (37.4 C) (Oral)   Ht 5' 9.5" (1.765 m)   Wt 169 lb 12 oz (77 kg)   LMP 09/07/2016   SpO2 98%   BMI 24.71 kg/m  Wt Readings from Last 3 Encounters:  09/08/16 169 lb 12 oz (77 kg)  08/25/16 175 lb (79.4 kg)  08/22/16 175 lb (79.4 kg)    General: Appears her stated age, well developed, well nourished in NAD. Skin: Clammy. No rashes, lesions or ulcerations noted. HEENT: Head: normal shape and size; Eyes:  sclera white, no icterus, conjunctiva pink, PERRLA and EOMs intact; Ears: Tm's gray and intact, normal light reflex; Throat/Mouth: Teeth present, mucosa erythematous and moist, no exudate, lesions or ulcerations noted.  Neck:  Neck supple, trachea midline. No masses, lumps or thyromegaly present.  Cardiovascular: Tachycardic with normal rhythm. S1,S2 noted.  No murmur, rubs or gallops noted. No JVD or BLE edema.  Pulmonary/Chest: Normal effort and positive vesicular breath sounds. No respiratory distress. No wheezes, rales or ronchi noted.  Abdomen: Soft and nontender. Normal bowel sounds. No distention or  masses noted. Liver, spleen and kidneys non palpable. Musculoskeletal: Normal range of motion. Strength 5/5 BUE/BLE. No difficulty with gait.  Neurological: Alert and oriented. Cranial nerves II-XII grossly intact. Coordination normal.  Psychiatric: Mood and affect normal. Behavior is normal. Judgment and thought content normal.     BMET    Component Value Date/Time   NA 134 (L) 04/02/2016 1422   K 3.9 04/02/2016 1422   CL 103 04/02/2016 1422   CO2 23 04/02/2016 1422   GLUCOSE 83 04/02/2016 1422   BUN 14 04/02/2016 1422   CREATININE 0.86 04/02/2016 1422   CALCIUM 9.9 04/02/2016 1422    Lipid Panel  No results found for: CHOL, TRIG, HDL, CHOLHDL, VLDL, LDLCALC  CBC    Component Value Date/Time   WBC 9.9 04/02/2016 1422   RBC 4.74 04/02/2016 1422   HGB 14.3 04/02/2016 1422   HCT 42.1 04/02/2016 1422   PLT 337.0 04/02/2016 1422   MCV 88.8 04/02/2016 1422   MCHC 33.9 04/02/2016 1422   RDW 12.6 04/02/2016 1422    Hgb A1C No results found for: HGBA1C      Assessment & Plan:   Preventative Health Maintenance:  Flu and tetanus UTD Pap smear due 2019 Encouraged her to consume a balanced diet and exercise program Advised her to see a dentist at least annually Lipid profile and HIV today  Viral URI:  Rapid flu: negative Ibuprofen for fever Make sure to get some rest and drink plenty of fluids  RTC in 1 year, sooner if needed

## 2016-09-08 NOTE — Patient Instructions (Signed)

## 2016-09-09 LAB — HIV ANTIBODY (ROUTINE TESTING W REFLEX): HIV: NONREACTIVE

## 2016-09-10 ENCOUNTER — Other Ambulatory Visit: Payer: Self-pay

## 2016-09-15 ENCOUNTER — Encounter: Payer: Self-pay | Admitting: Family Medicine

## 2016-10-10 MED FILL — LO LOESTRIN FE 1-10 TABLET: 1 MG-10 MCG | 84 days supply | Qty: 84 | Fill #1

## 2016-11-10 ENCOUNTER — Ambulatory Visit (INDEPENDENT_AMBULATORY_CARE_PROVIDER_SITE_OTHER): Payer: 59 | Admitting: Family Medicine

## 2016-11-10 DIAGNOSIS — R030 Elevated blood-pressure reading, without diagnosis of hypertension: Secondary | ICD-10-CM | POA: Diagnosis not present

## 2016-11-10 LAB — COMPREHENSIVE METABOLIC PANEL
ALBUMIN: 4.5 g/dL (ref 3.5–5.2)
ALK PHOS: 65 U/L (ref 39–117)
ALT: 11 U/L (ref 0–35)
AST: 16 U/L (ref 0–37)
BILIRUBIN TOTAL: 0.5 mg/dL (ref 0.2–1.2)
BUN: 8 mg/dL (ref 6–23)
CO2: 24 mEq/L (ref 19–32)
CREATININE: 0.83 mg/dL (ref 0.40–1.20)
Calcium: 9.8 mg/dL (ref 8.4–10.5)
Chloride: 105 mEq/L (ref 96–112)
GFR: 91.25 mL/min (ref >60.00)
Glucose, Bld: 89 mg/dL (ref 70–99)
POTASSIUM: 4.1 meq/L (ref 3.5–5.1)
SODIUM: 136 meq/L (ref 135–145)
TOTAL PROTEIN: 7.6 g/dL (ref 6.0–8.3)

## 2016-11-10 LAB — CBC WITH DIFFERENTIAL/PLATELET
BASOS PCT: 0.7 % (ref 0.0–3.0)
Basophils Absolute: 0.1 10*3/uL (ref 0.0–0.1)
EOS ABS: 0.1 10*3/uL (ref 0.0–0.7)
Eosinophils Relative: 1.2 % (ref 0.0–5.0)
HEMATOCRIT: 42.5 % (ref 36.0–46.0)
Hemoglobin: 14.3 g/dL (ref 12.0–15.0)
LYMPHS ABS: 1.9 10*3/uL (ref 0.7–4.0)
LYMPHS PCT: 23.3 % (ref 12.0–46.0)
MCHC: 33.6 g/dL (ref 30.0–36.0)
MCV: 88.8 fl (ref 78.0–100.0)
Monocytes Absolute: 0.4 10*3/uL (ref 0.1–1.0)
Monocytes Relative: 5 % (ref 3.0–12.0)
NEUTROS ABS: 5.7 10*3/uL (ref 1.4–7.7)
NEUTROS PCT: 69.8 % (ref 43.0–77.0)
PLATELETS: 371 10*3/uL (ref 150.0–400.0)
RBC: 4.79 Mil/uL (ref 3.87–5.11)
RDW: 13 % (ref 11.5–15.5)
WBC: 8.1 10*3/uL (ref 4.0–10.5)

## 2016-11-10 LAB — T4, FREE: Free T4: 0.6 ng/dL (ref 0.60–1.60)

## 2016-11-10 LAB — TSH: TSH: 0.75 u[IU]/mL (ref 0.35–4.50)

## 2016-11-10 NOTE — Progress Notes (Signed)
Subjective:   Patient ID: Sherri Harris, female    DOB: 03/24/1996, 21 y.o.   MRN: [id number]  Sherri Harris is a pleasant 21 y.o. year old female pt of Nicki ReaperRegina Baity, who presents to clinic today with Hypertension (140s/90s)  on 11/10/2016  HPI:  She feels her BP has been going up over the past month or two.  Bought a BP cuff and when she checks it at home, it has been as high as 150s/100. No HA. No blurred vision. No CP or SOB.  Admits to have "an anxious personality."   Both parents have high blood.  Mom also has the diagnosis of myasthenia gravis.  Lab Results  Component Value Date   TSH 0.76 04/02/2016   Lab Results  Component Value Date   NA 134 (L) 04/02/2016   K 3.9 04/02/2016   CL 103 04/02/2016   CO2 23 04/02/2016   Lab Results  Component Value Date   CREATININE 0.86 04/02/2016   Lab Results  Component Value Date   ALT 13 04/02/2016   AST 18 04/02/2016   ALKPHOS 62 04/02/2016   BILITOT 0.4 04/02/2016     Current Outpatient Prescriptions on File Prior to Visit  Medication Sig Dispense Refill  . Norethindrone-Ethinyl Estradiol-Fe Biphas (LO LOESTRIN FE) 1 MG-10 MCG / 10 MCG tablet Take 1 tablet by mouth daily. 1 Package 11  . [DISCONTINUED] traZODone (DESYREL) 50 MG tablet Take 1 tablet (50 mg total) by mouth at bedtime. 30 tablet 0   No current facility-administered medications on file prior to visit.     Allergies  Allergen Reactions  . Amoxicillin Rash    REACTION: rash  . Penicillins Rash    No past medical history on file.  Past Surgical History:  Procedure Laterality Date  . TYMPANOSTOMY TUBE PLACEMENT    . WISDOM TOOTH EXTRACTION      Family History  Problem Relation Age of Onset  . Hypertension Mother   . Thyroid disease Mother   . Hypertension Maternal Aunt   . Hypertension Maternal Uncle   . Heart disease Maternal Grandmother   . Cancer Maternal Grandmother     Breast    Social History   Social History  . Marital  status: Single    Spouse name: N/A  . Number of children: N/A  . Years of education: N/A   Occupational History  . Not on file.   Social History Main Topics  . Smoking status: Never Smoker  . Smokeless tobacco: Never Used  . Alcohol use 0.0 oz/week     Comment: occasional  . Drug use: No  . Sexual activity: Yes    Birth control/ protection: Pill   Other Topics Concern  . Not on file   Social History Narrative  . No narrative on file   The PMH, PSH, Social History, Family History, Medications, and allergies [facility], and have been updated if relevant.   Review of Systems  Constitutional: Negative.   HENT: Negative.   Eyes: Negative.   Respiratory: Negative.   Cardiovascular: Negative.   Gastrointestinal: Negative.   Endocrine: Negative.   Genitourinary: Negative.   Musculoskeletal: Negative.   Allergic/Immunologic: Negative.   Neurological: Negative.   Hematological: Negative.   Psychiatric/Behavioral: Negative for dysphoric mood. The patient is nervous/anxious.   All other systems reviewed and are negative.      Objective:    BP (!) 144/82   Pulse 100   Temp 98.6 F (  37 C) (Oral)   Ht 5' 9.5" (1.765 m)   Wt 171 lb 8 oz (77.8 kg)   LMP 11/08/2016   SpO2 98%   BMI 24.96 kg/m   BP Readings from Last 3 Encounters:  11/10/16 (!) 144/82  09/08/16 (!) 134/96  08/25/16 (!) 137/96     Physical Exam  Constitutional: She is oriented to person, place, and time. She appears well-developed and well-nourished. No distress.  HENT:  Head: Normocephalic and atraumatic.  Eyes: Conjunctivae are normal.  Cardiovascular: Regular rhythm.  Tachycardia present.   Pulmonary/Chest: Effort normal and breath sounds normal. No respiratory distress.  Musculoskeletal: Normal range of motion. She exhibits no edema.  Neurological: She is alert and oriented to person, place, and time. No cranial nerve deficit.  Skin: Skin is warm and dry. She is not diaphoretic.   Psychiatric: She has a normal mood and affect. Her behavior is normal. Judgment and thought content normal.  Nursing note and vitals reviewed.         Assessment & Plan:   Elevated blood pressure reading - Plan: Comprehensive metabolic panel, TSH, T4, Free, CBC with Differential/Platelet No Follow-up on file.

## 2016-11-10 NOTE — Progress Notes (Signed)
Pre visit review using our clinic review tool, if applicable. No additional management support is needed unless otherwise documented below in the visit note. 

## 2016-11-10 NOTE — Assessment & Plan Note (Signed)
Only mildly elevated today. Her pulse is high.  ? Anxiety but also need to rule out thyroid dysfunction and other abnormalities with lab work. If labs normal, consider US to rule out RAS. The patient indicates understanding of these issues and agrees with the plan. Orders Placed This Encounter  Procedures  . Comprehensive metabolic panel  . TSH  . T4, Free  . CBC with Differential/Platelet

## 2016-11-10 NOTE — Patient Instructions (Signed)
Great to see you. I will call you with your lab results.   

## 2016-11-12 ENCOUNTER — Encounter: Payer: Self-pay | Admitting: Family Medicine

## 2016-11-12 DIAGNOSIS — I10 Essential (primary) hypertension: Secondary | ICD-10-CM

## 2016-11-20 ENCOUNTER — Telehealth: Payer: Self-pay | Admitting: Internal Medicine

## 2016-11-20 DIAGNOSIS — R030 Elevated blood-pressure reading, without diagnosis of hypertension: Secondary | ICD-10-CM

## 2016-11-20 NOTE — Telephone Encounter (Signed)
Order placed

## 2016-11-20 NOTE — Telephone Encounter (Signed)
Pt would like referral for renal artery ultrasound that was discussed at last visit.  Pt prefers [place] and prefers early morning  Cell [phone], mother Misty StanleyLisa

## 2016-11-27 ENCOUNTER — Encounter: Payer: Self-pay | Admitting: *Deleted

## 2016-11-27 ENCOUNTER — Emergency Department (INDEPENDENT_AMBULATORY_CARE_PROVIDER_SITE_OTHER)
Admission: EM | Admit: 2016-11-27 | Discharge: 2016-11-27 | Disposition: A | Discharge status: Home / Self Care | Payer: 59 | Source: Home / Self Care | Attending: Family Medicine | Admitting: Family Medicine

## 2016-11-27 ENCOUNTER — Ambulatory Visit: Payer: Self-pay | Admitting: Medical

## 2016-11-27 DIAGNOSIS — J029 Acute pharyngitis, unspecified: Secondary | ICD-10-CM

## 2016-11-27 DIAGNOSIS — J039 Acute tonsillitis, unspecified: Secondary | ICD-10-CM | POA: Diagnosis not present

## 2016-11-27 DIAGNOSIS — R112 Nausea with vomiting, unspecified: Secondary | ICD-10-CM

## 2016-11-27 DIAGNOSIS — R197 Diarrhea, unspecified: Secondary | ICD-10-CM

## 2016-11-27 LAB — POCT RAPID STREP A (OFFICE): Rapid Strep A Screen: NEGATIVE

## 2016-11-27 MED ORDER — ONDANSETRON HCL 4 MG PO TABS: 4.0000 mg | ORAL_TABLET | Freq: Four times a day (QID) | ORAL | 0 refills | Status: DC

## 2016-11-27 MED ORDER — AZITHROMYCIN 250 MG PO TABS: 250.0000 mg | ORAL_TABLET | Freq: Every day | ORAL | 0 refills | Status: DC

## 2016-11-27 MED FILL — ONDANSETRON HCL 4 MG TABLET: 4 | 4 days supply | Qty: 12 | Fill #0

## 2016-11-27 NOTE — ED Triage Notes (Signed)
Pt c/o nausea and diarrhea yesterday; today sinus pressure, sore throat and LT ear ache. Temp 100 yesterday. She took zofran this morning.

## 2016-11-27 NOTE — ED Provider Notes (Signed)
CSN: [id number]     Arrival date & time 11/27/16  1110 History   First MD Initiated Contact with Patient 11/27/16 1130     Chief Complaint  Patient presents with  . Nausea  . Diarrhea  . Sore Throat   (Consider location/radiation/quality/duration/timing/severity/associated sxs/prior Treatment) HPI Sherri Harris is a 21 y.o. female presenting to UC with c/o nausea, diarrhea, sore throat, mild sinus congestion, mild headache and body aches that started yesterday.  Sore throat is most bothersome for pt, and mild Left ear pain.  Denies cough, chest pain or SOB.  She does work in [employer], [employer] and is around sick patients often.  She did get the flu vaccine this season.  Yesterday, she developed a fever Tmax 100*F.  No fever today but she did take zofran for nausea. Moderate relief.     History reviewed. No pertinent past medical history. Past Surgical History:  Procedure Laterality Date  . TYMPANOSTOMY TUBE PLACEMENT    . WISDOM TOOTH EXTRACTION     Family History  Problem Relation Age of Onset  . Hypertension Mother   . Thyroid disease Mother   . Hypertension Maternal Aunt   . Hypertension Maternal Uncle   . Heart disease Maternal Grandmother   . Cancer Maternal Grandmother     Breast   Social History  Substance Use Topics  . Smoking status: Never Smoker  . Smokeless tobacco: Never Used  . Alcohol use 0.0 oz/week     Comment: occasional   OB History    No data available     Review of Systems  Constitutional: Positive for fever. Negative for chills.  HENT: Positive for congestion and sore throat. Negative for ear pain, trouble swallowing and voice change.   Respiratory: Negative for cough and shortness of breath.   Cardiovascular: Negative for chest pain and palpitations.  Gastrointestinal: Positive for diarrhea, nausea and vomiting. Negative for abdominal pain.  Musculoskeletal: Positive for arthralgias and myalgias. Negative for back pain.  Skin: Negative for rash.   Neurological: Positive for headaches. Negative for dizziness and light-headedness.    Allergies  Amoxicillin and Penicillins  Home Medications   Prior to Admission medications   Medication Sig Start Date End Date Taking? Authorizing Provider  azithromycin (ZITHROMAX) 250 MG tablet Take 1 tablet (250 mg total) by mouth daily. Take first 2 tablets together, then 1 every day until finished. 11/27/16   Junius FinnerErin O'Malley, PA-C  Norethindrone-Ethinyl Estradiol-Fe Biphas (LO LOESTRIN FE) 1 MG-10 MCG / 10 MCG tablet Take 1 tablet by mouth daily. 07/25/16   Amy Michelle NasutiE Bedsole, MD  ondansetron (ZOFRAN) 4 MG tablet Take 1 tablet (4 mg total) by mouth every 6 (six) hours. 11/27/16   Junius FinnerErin O'Malley, PA-C   Meds Ordered and Administered this Visit  Medications - No data to display  BP 143/100 (BP Location: Left Arm)   Pulse 99   Temp 98.6 F (37 C) (Oral)   Resp 16   Ht 5\' 9"  (1.753 m)   Wt 168 lb (76.2 kg)   LMP 11/08/2016   SpO2 97%   BMI 24.81 kg/m  No data found.   Physical Exam  Constitutional: She is oriented to person, place, and time. She appears well-developed and well-nourished. No distress.  HENT:  Head: Normocephalic and atraumatic.  Right Ear: Tympanic membrane normal.  Left Ear: Tympanic membrane normal.  Nose: Nose normal.  Mouth/Throat: Uvula is midline and mucous membranes are normal. Oropharyngeal exudate ( on Right tonsil), posterior oropharyngeal edema and posterior  oropharyngeal erythema present. No tonsillar abscesses.  Eyes: EOM are normal.  Neck: Normal range of motion. Neck supple.  Cardiovascular: Normal rate and regular rhythm.   Pulmonary/Chest: Effort normal and breath sounds normal. No stridor. No respiratory distress. She has no wheezes. She has no rales. She exhibits no tenderness.  Musculoskeletal: Normal range of motion.  Lymphadenopathy:    She has no cervical adenopathy.  Neurological: She is alert and oriented to person, place, and time.  Skin: Skin is warm  and dry. She is not diaphoretic.  Psychiatric: She has a normal mood and affect. Her behavior is normal.  Nursing note and vitals reviewed.   Urgent Care Course     Procedures (including critical care time)  Labs Review Labs Reviewed  STREP A DNA PROBE  POCT RAPID STREP A (OFFICE)    Imaging Review No results found.   MDM   1. Tonsillitis with exudate   2. Nausea vomiting and diarrhea    Rapid strep: Negative Will send culture.  Symptoms likely viral in nature.  Encouraged symptomatic treatment. Pt allergic to PCN- rash.  Rx: zofran, azithromycin  Prescription to hold with expiration date for azithromycin. Pt to fill if strep culture comes back positive, persistent fever develops or not improving in 1 week.      Junius Finner, PA-C 11/27/16 1250

## 2016-11-27 NOTE — Discharge Instructions (Signed)
°  Your rapid strep test was Negative today, however, a more detailed test known as a culture has been sent to the lab for more testing. This takes about 24-48 hours.  If the culture comes back Positive, we will let you know to start taking your antibiotic right away.  If it is negative, your symptoms are likely from a virus and should resolve within 1 week.  If you develop worsening pain, fever for 3 days, or not improving in 4-5 days, you may start taking the antibiotic even if the culture comes back negative.  If you develop trouble breathing or swallowing liquids, please be re-evaluated by a medical provider.   You may take 400-600mg  Ibuprofen (Motrin) every 6-8 hours for fever and pain  Alternate with Tylenol  You may take 500mg  Tylenol every 4-6 hours as needed for fever and pain  Follow-up with your primary care provider next week for recheck of symptoms if not improving.  Be sure to drink plenty of fluids and rest, at least 8hrs of sleep a night, preferably more while you are sick. Return urgent care or go to closest ER if you cannot keep down fluids/signs of dehydration, fever not reducing with Tylenol, difficulty breathing/wheezing, stiff neck, worsening condition, or other concerns (see below)

## 2016-11-28 ENCOUNTER — Telehealth: Payer: Self-pay | Admitting: *Deleted

## 2016-11-28 ENCOUNTER — Ambulatory Visit: Payer: Self-pay | Admitting: Family Medicine

## 2016-11-28 LAB — STREP A DNA PROBE: GASP: NOT DETECTED

## 2016-11-28 NOTE — Telephone Encounter (Signed)
Callback: TCX results given. She is not improving as of today.

## 2016-12-10 NOTE — Telephone Encounter (Signed)
12/18/16 

## 2016-12-10 NOTE — Telephone Encounter (Signed)
Appointment 12/18/16 pt aware

## 2016-12-18 ENCOUNTER — Inpatient Hospital Stay (HOSPITAL_COMMUNITY): Admission: RE | Admit: 2016-12-18 | Payer: 59 | Source: Ambulatory Visit

## 2016-12-25 ENCOUNTER — Encounter (HOSPITAL_COMMUNITY): Payer: Self-pay | Admitting: Family Medicine

## 2017-01-02 ENCOUNTER — Encounter: Payer: Self-pay | Admitting: *Deleted

## 2017-01-02 ENCOUNTER — Emergency Department (INDEPENDENT_AMBULATORY_CARE_PROVIDER_SITE_OTHER)
Admission: EM | Admit: 2017-01-02 | Discharge: 2017-01-02 | Disposition: A | Discharge status: Home / Self Care | Payer: 59 | Source: Home / Self Care | Attending: Emergency Medicine | Admitting: Emergency Medicine

## 2017-01-02 ENCOUNTER — Other Ambulatory Visit: Payer: Self-pay

## 2017-01-02 DIAGNOSIS — R0789 Other chest pain: Secondary | ICD-10-CM | POA: Diagnosis not present

## 2017-01-02 DIAGNOSIS — R09A2 Foreign body sensation, throat: Secondary | ICD-10-CM

## 2017-01-02 DIAGNOSIS — R0989 Other specified symptoms and signs involving the circulatory and respiratory systems: Secondary | ICD-10-CM

## 2017-01-02 DIAGNOSIS — L299 Pruritus, unspecified: Secondary | ICD-10-CM

## 2017-01-02 DIAGNOSIS — R198 Other specified symptoms and signs involving the digestive system and abdomen: Secondary | ICD-10-CM

## 2017-01-02 DIAGNOSIS — R030 Elevated blood-pressure reading, without diagnosis of hypertension: Secondary | ICD-10-CM

## 2017-01-02 MED ORDER — HYDROXYZINE HCL 10 MG PO TABS: ORAL_TABLET | ORAL | 0 refills | Status: DC

## 2017-01-02 NOTE — ED Triage Notes (Addendum)
Patient c/o itching all over her body x last night. This started when she got in the bed. Denies use of any new clothing, meds, detergents, etc. Absence of rash. C/o chest tightness this AM, feeling anxious.Taken 1 zyrtec this AM

## 2017-01-02 NOTE — Discharge Instructions (Signed)
EKG within normal limits. We are prescribing hydroxyzine which can help itch and anxiety. Excuse from work today. Be sure to follow-up with your PCP within 3 days to recheck the pressure and your other symptoms.

## 2017-01-02 NOTE — ED Provider Notes (Signed)
[facility]    CSN: [id number] Arrival date & time: 01/02/17  [id number]     History   Chief Complaint Chief Complaint  Patient presents with  . Pruritis    HPI Sherri Harris is a 21 y.o. female.   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 [facility] with feelings of pruritus, vague feeling of anterior chest tightness, feels "like my blood pressure is up", onset last night. Had difficulty sleeping because of these symptoms, especially the pruritus. Has vague feeling of tightness in anterior mid upper chest and in anterior neck. Vague feeling of dysphagia. The chest pressure is mild, 2 out of 10, without any definite shortness of breath,. No radiation, nausea or vomiting or abdominal or back pain. No fever or chills or URI symptoms.  She just took 1 plain Zyrtec(without decongestant) a few minutes before being seen here this morning. Other than oral contraceptives, denies any medications. Denies illegal drugs. Denies any OTC decongestants. To her knowledge, no recent exposure to any allergens. No new soaps or detergents.  She was seen by her PCP 2018 for elevated BP, CBC, CMP, T4, TSH were all within normal limits.  Patient describes herself as a tendency to have anxiety and being a nervous person. She states she was on some type of anxiety medication in the past, but that was DC'd over a year ago.  At one point, she had a few seconds of weeping during visit when discussing her stressors in her life such as working and going to school and especially a family member who has been ill.  She denies chance of pregnancy  History reviewed. No pertinent past medical history.  Patient Active Problem List   Diagnosis Date Noted  . Elevated blood pressure reading 11/10/2016  . 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 Contraception management 07/23/2016    Past Surgical History:  Procedure Laterality Date  . TYMPANOSTOMY TUBE PLACEMENT      . WISDOM TOOTH EXTRACTION      OB History    No data available       Home Medications    Prior to Admission medications   Medication Sig Start Date End Date Taking? Authorizing Provider  hydrOXYzine (ATARAX/VISTARIL) 10 MG tablet Take 1 every 4 hours as needed for itch or anxiety. Caution: May cause drowsiness 01/02/17   Lajean Manesavid Massey, MD  Norethindrone-Ethinyl Estradiol-Fe Biphas (LO LOESTRIN FE) 1 MG-10 MCG / 10 MCG tablet Take 1 tablet by mouth daily. 07/25/16   Excell SeltzerAmy E Bedsole, MD    Family History Family History  Problem Relation Age of Onset  . Hypertension Mother   . Thyroid disease Mother   . Hypertension Maternal Aunt   . Hypertension Maternal Uncle   . Heart disease Maternal Grandmother   . Cancer Maternal Grandmother     Breast    Social History Social History  Substance Use Topics  . Smoking status: Never Smoker  . Smokeless tobacco: Never Used  . Alcohol use 0.0 oz/week     Comment: occasional     Allergies   Amoxicillin and Penicillins   Review of Systems Review of Systems  All other systems reviewed and are negative.    Physical Exam Triage Vital Signs ED Triage Vitals  Enc Vitals Group     BP      Pulse      Resp      Temp      Temp src      SpO2  Weight      Height      Head Circumference      Peak Flow      Pain Score      Pain Loc      Pain Edu?      Excl. in GC?    No data found.   Updated Vital Signs BP (!) 159/119 (BP Location: Left Arm)   Pulse 91   Temp 98.2 F (36.8 C) (Oral)   Wt 168 lb (76.2 kg)   SpO2 99%   BMI 24.81 kg/m   Visual Acuity Right Eye Distance:   Left Eye Distance:   Bilateral Distance:    Right Eye Near:   Left Eye Near:    Bilateral Near:     Physical Exam  Constitutional: She is oriented to person, place, and time. She appears well-developed and well-nourished. No distress.  No acute distress, but she appears mildly uncomfortable and mildly anxious. Speech and thought content  normal.  HENT:  Head: Normocephalic and atraumatic.  Mouth/Throat: Oropharynx is clear and moist.  Eyes: Pupils are equal, round, and reactive to light. No scleral icterus.  Neck: Normal range of motion. Neck supple. No JVD present. No tracheal deviation present. No thyromegaly present.  Neck is nontender. She points to the anterior aspect of her neck diffusely, but there is no mass or tenderness or abnormality seen or palpated . No bruits  Cardiovascular: Normal rate, regular rhythm, normal heart sounds and intact distal pulses.  Exam reveals no gallop and no friction rub.   No murmur heard. Pulmonary/Chest: Effort normal and breath sounds normal. No stridor.  Abdominal: Soft. She exhibits no distension and no mass. There is no tenderness. There is no rebound and no guarding.  Lymphadenopathy:    She has no cervical adenopathy.  Neurological: She is alert and oriented to person, place, and time. No cranial nerve deficit or sensory deficit. She exhibits normal muscle tone.  Skin: Skin is warm and dry. Capillary refill takes less than 2 seconds. No erythema.  Psychiatric: She has a normal mood and affect. Her behavior is normal.  Pleasant female. Mildly anxious. No depressive symptoms. No suicidal or homicidal ideation.-At one point, she had a few seconds of weeping when discussing her stressors in her life such as working and going to school and especially family member whose been ill.  Vitals reviewed.    UC Treatments / Results  Labs (all labs ordered are listed, but only abnormal results are displayed) Labs Reviewed - No data to display  EKG  EKG Interpretation None       Radiology No results found.  Procedures Procedures (including critical care time)  Medications Ordered in UC Medications - No data to display   Initial Impression / Assessment and Plan / UC Course  I have reviewed the triage vital signs and the nursing notes.  Pertinent labs & imaging results that  were available during my care of the patient were reviewed by me and considered in my medical decision making (see chart for details).  Clinical Course as of Jan 03 943  Fri Jan 02, 2017  0921 EKG, normal sinus rhythm nonspecific T-wave abnormality in V3 and V4, in my opinion not significant. Otherwise EKG within normal limits. Rate 75, PR interval 0.130  [DM]    Clinical Course User Index [DM] Lajean Manes, MD      Final Clinical Impressions(s) / UC Diagnoses  No evidence of acute cardiorespiratory cause for her mild chest  discomfort.  It's possible she could've had an allergic reaction, but there are no hives, no rash, edema, no wheezing. Airway intact. Heart and lung exam normal . She does have elevated BP, I rechecked BP right arm regular cuff sitting 158/92. Most likely symptoms are related to anxiety .- We discussed this at length today. She declined any injection of Benadryl. She prefers oral medication for treatment, and I prescribed low-dose hydroxyzine to help with itching and anxiety.  An After Visit Summary was printed and given to the patient. Questions invited and answered Flags discussed. Wrote a note to be excused from work today, but needs to follow-up with PCP within 3 days . He voiced understanding and agreement with plans     Final diagnoses:  Pruritus  Atypical chest pain  Elevated blood-pressure reading without diagnosis of hypertension  Globus sensation    New Prescriptions New Prescriptions   HYDROXYZINE (ATARAX/VISTARIL) 10 MG TABLET    Take 1 every 4 hours as needed for itch or anxiety. Caution: May cause drowsiness     Lajean Manes, MD 01/02/17 [phone]

## 2017-01-04 ENCOUNTER — Telehealth: Payer: Self-pay | Admitting: Emergency Medicine

## 2017-01-04 NOTE — Telephone Encounter (Signed)
Left message advising if doing well, to disregard call, any questions or concerns, will contact the office.  TMartin,CMA

## 2017-01-05 MED FILL — LO LOESTRIN FE 1-10 TABLET: 1 MG-10 MCG | 84 days supply | Qty: 84 | Fill #2

## 2017-03-03 ENCOUNTER — Telehealth: Payer: Self-pay | Admitting: Internal Medicine

## 2017-03-03 NOTE — Telephone Encounter (Signed)
I was able to reschedule her for tomorrow and mother is aware.

## 2017-03-03 NOTE — Telephone Encounter (Signed)
Patient's mother called.  Patient was scheduled for a renal ultrasound in February.  Patient was in the middle of school in February and didn't go to appointment..  Patient would like to have the ultrasound ordered again.  Patient's mother would like to have ultrasound done as soon as possible.  Patient is joining the [employer]. If patient's mother,Lisa,can't be reached at work, please call her cell phone [phone].  Patient's unavailable on June 6th, but is available any other time.

## 2017-03-03 NOTE — Telephone Encounter (Signed)
Pt was seen by Dr. Dayton MartesAron and this was ordered by Dr. Dayton MartesAron. I have not seen her for her blood pressure issue, so she needs to make a follow up appt to discuss, unless Shirlee LimerickMarion thinks this can be rescheduled without another order being placed

## 2017-03-04 ENCOUNTER — Other Ambulatory Visit: Payer: Self-pay | Admitting: Internal Medicine

## 2017-03-04 ENCOUNTER — Ambulatory Visit (HOSPITAL_COMMUNITY)
Admission: RE | Admit: 2017-03-04 | Discharge: 2017-03-04 | Disposition: A | Discharge status: Home / Self Care | Payer: 59 | Source: Ambulatory Visit | Attending: Cardiology | Admitting: Cardiology

## 2017-03-04 DIAGNOSIS — R030 Elevated blood-pressure reading, without diagnosis of hypertension: Secondary | ICD-10-CM | POA: Diagnosis not present

## 2017-03-18 ENCOUNTER — Encounter: Payer: Self-pay | Admitting: Internal Medicine

## 2017-04-06 MED FILL — LO LOESTRIN FE 1-10 TABLET: 1 MG-10 MCG | 84 days supply | Qty: 84 | Fill #3

## 2017-06-17 ENCOUNTER — Ambulatory Visit (INDEPENDENT_AMBULATORY_CARE_PROVIDER_SITE_OTHER): Payer: 59 | Admitting: Family Medicine

## 2017-06-17 ENCOUNTER — Encounter: Payer: Self-pay | Admitting: Family Medicine

## 2017-06-17 VITALS — BP 140/110 | HR 95 | Temp 98.6°F | Resp 16 | Ht 69.0 in | Wt 155.4 lb

## 2017-06-17 DIAGNOSIS — F419 Anxiety disorder, unspecified: Secondary | ICD-10-CM

## 2017-06-17 DIAGNOSIS — R030 Elevated blood-pressure reading, without diagnosis of hypertension: Secondary | ICD-10-CM | POA: Diagnosis not present

## 2017-06-17 MED ORDER — CITALOPRAM HYDROBROMIDE 20 MG PO TABS: 20.0000 mg | ORAL_TABLET | Freq: Every day | ORAL | 3 refills | Status: DC

## 2017-06-17 MED FILL — CITALOPRAM HBR 20 MG TABLET: 20 | 30 days supply | Qty: 30 | Fill #0

## 2017-06-17 NOTE — Progress Notes (Signed)
Subjective:   Patient ID: Sherri SheererHeather E Marciano, female    DOB: 10/04/1996, 21 y.o.   MRN: [id number]  Sherri Harris is a pleasant 21 y.o. year old female pt of Nicki ReaperRegina Baity, who presents to clinic today with Hypertension and Anxiety  on 06/17/2017  HPI:  She feels her BP has been elevated.  I saw her for this complaint in 10/2016.  Bought a BP cuff and when she checks it at home, it has been as high as 140s/100s/ No HA. No blurred vision. No CP or SOB.  Admits to have "an anxious personality." Was on lexapro over a year ago.  Felt it made her "too numb."  Under a lot of stress- recent long term break up and deaths in the family.  Having more anxiety, not sleeping well.  Denies SI or HI.    Lab Results  Component Value Date   TSH 0.75 11/10/2016   Lab Results  Component Value Date   NA 136 11/10/2016   K 4.1 11/10/2016   CL 105 11/10/2016   CO2 24 11/10/2016   Lab Results  Component Value Date   CREATININE 0.83 11/10/2016   Lab Results  Component Value Date   ALT 11 11/10/2016   AST 16 11/10/2016   ALKPHOS 65 11/10/2016   BILITOT 0.5 11/10/2016     Current Outpatient Prescriptions on File Prior to Visit  Medication Sig Dispense Refill  . hydrOXYzine (ATARAX/VISTARIL) 10 MG tablet Take 1 every 4 hours as needed for itch or anxiety. Caution: May cause drowsiness 21 tablet 0  . Norethindrone-Ethinyl Estradiol-Fe Biphas (LO LOESTRIN FE) 1 MG-10 MCG / 10 MCG tablet Take 1 tablet by mouth daily. 1 Package 11  . [DISCONTINUED] traZODone (DESYREL) 50 MG tablet Take 1 tablet (50 mg total) by mouth at bedtime. 30 tablet 0   No current facility-administered medications on file prior to visit.     Allergies  Allergen Reactions  . Amoxicillin Rash    REACTION: rash  . Penicillins Rash    History reviewed. No pertinent past medical history.  Past Surgical History:  Procedure Laterality Date  . TYMPANOSTOMY TUBE PLACEMENT    . WISDOM TOOTH EXTRACTION       Family History  Problem Relation Age of Onset  . Hypertension Mother   . Thyroid disease Mother   . Hypertension Maternal Aunt   . Hypertension Maternal Uncle   . Heart disease Maternal Grandmother   . Cancer Maternal Grandmother        Breast    Social History   Social History  . Marital status: Single    Spouse name: N/A  . Number of children: N/A  . Years of education: N/A   Occupational History  . Not on file.   Social History Main Topics  . Smoking status: Never Smoker  . Smokeless tobacco: Never Used  . Alcohol use 0.0 oz/week     Comment: occasional  . Drug use: No  . Sexual activity: Yes    Birth control/ protection: Pill   Other Topics Concern  . Not on file   Social History Narrative  . No narrative on file   The PMH, PSH, Social History, Family History, Medications, and allergies [facility], and have been updated if relevant.   Review of Systems  Constitutional: Negative.   HENT: Negative.   Eyes: Negative.   Respiratory: Negative.   Cardiovascular: Negative.   Gastrointestinal: Negative.   Endocrine: Negative.  Genitourinary: Negative.   Musculoskeletal: Negative.   Allergic/Immunologic: Negative.   Neurological: Negative.   Hematological: Negative.   Psychiatric/Behavioral: Negative for dysphoric mood. The patient is nervous/anxious.   All other systems reviewed and are negative.      Objective:    BP (!) 140/110   Pulse 95   Temp 98.6 F (37 C) (Oral)   Resp 16   Ht 5\' 9"  (1.753 m)   Wt 155 lb 6.4 oz (70.5 kg)   LMP  (LMP Unknown)   SpO2 98%   BMI 22.95 kg/m   BP Readings from Last 3 Encounters:  06/17/17 (!) 140/110  01/02/17 (!) 159/119  11/27/16 143/100     Physical Exam  Constitutional: She is oriented to person, place, and time. She appears well-developed and well-nourished. No distress.  HENT:  Head: Normocephalic and atraumatic.  Eyes: Conjunctivae are normal.  Cardiovascular: Regular  rhythm.  Tachycardia present.   Pulmonary/Chest: Effort normal and breath sounds normal. No respiratory distress.  Musculoskeletal: Normal range of motion. She exhibits no edema.  Neurological: She is alert and oriented to person, place, and time. No cranial nerve deficit.  Skin: Skin is warm and dry. She is not diaphoretic.  Psychiatric: She has a normal mood and affect. Her behavior is normal. Judgment and thought content normal.  Nursing note and vitals reviewed.         Assessment & Plan:   No diagnosis found. No Follow-up on file.

## 2017-06-17 NOTE — Assessment & Plan Note (Signed)
Deteriorated. Discussed tx options. She would like to try another rx. eRx sent for citalopram 20 mg daily (her mother also takes this for anxiety). Follow up with myself or PCP in 1 month. The patient indicates understanding of these issues and agrees with the plan.

## 2017-06-17 NOTE — Assessment & Plan Note (Signed)
Mildly elevated here- likely due to anxiety. Given handout about how to check BP at home, advised that she check it less often.

## 2017-06-17 NOTE — Patient Instructions (Signed)
Great to see you.   We are starting celexa 20 mg daily.  Please follow up with myself or Rene KocherRegina in 1 month.

## 2017-06-25 ENCOUNTER — Other Ambulatory Visit: Payer: Self-pay | Admitting: Family Medicine

## 2017-06-25 MED FILL — LO LOESTRIN FE 1-10 TABLET: 1 MG-10 MCG | 28 days supply | Qty: 28 | Fill #0

## 2017-07-16 MED FILL — CITALOPRAM HBR 20 MG TABLET: 20 | 30 days supply | Qty: 30 | Fill #1

## 2017-07-20 ENCOUNTER — Other Ambulatory Visit: Payer: Self-pay | Admitting: Internal Medicine

## 2017-07-21 MED FILL — LO LOESTRIN FE 1-10 TABLET: 1 MG-10 MCG | 28 days supply | Qty: 28 | Fill #0

## 2017-08-12 DIAGNOSIS — Z01419 Encounter for gynecological examination (general) (routine) without abnormal findings: Secondary | ICD-10-CM | POA: Diagnosis not present

## 2017-08-12 DIAGNOSIS — Z6823 Body mass index (BMI) 23.0-23.9, adult: Secondary | ICD-10-CM | POA: Diagnosis not present

## 2017-08-12 DIAGNOSIS — Z113 Encounter for screening for infections with a predominantly sexual mode of transmission: Secondary | ICD-10-CM | POA: Diagnosis not present

## 2017-08-14 ENCOUNTER — Other Ambulatory Visit: Payer: Self-pay | Admitting: Internal Medicine

## 2017-08-14 MED FILL — LO LOESTRIN FE 1-10 TABLET: 1 MG-10 MCG | 28 days supply | Qty: 28 | Fill #0

## 2017-08-17 MED FILL — CITALOPRAM HBR 20 MG TABLET: 20 | 30 days supply | Qty: 30 | Fill #2

## 2017-08-18 ENCOUNTER — Encounter: Payer: Self-pay | Admitting: Family Medicine

## 2017-08-18 ENCOUNTER — Ambulatory Visit (INDEPENDENT_AMBULATORY_CARE_PROVIDER_SITE_OTHER): Payer: 59 | Admitting: Family Medicine

## 2017-08-18 VITALS — BP 142/100 | HR 92 | Temp 98.1°F | Ht 69.0 in | Wt 161.1 lb

## 2017-08-18 DIAGNOSIS — R070 Pain in throat: Secondary | ICD-10-CM

## 2017-08-18 LAB — POCT RAPID STREP A (OFFICE): RAPID STREP A SCREEN: NEGATIVE

## 2017-08-18 NOTE — Progress Notes (Signed)
SUBJECTIVE: 21 y.o. female with sore throat, myalgias, swollen glands, headache and fever for 6 days. No history of rheumatic fever. Other symptoms: cough, HA, fatigue and bilateral sinus pain.  Has been taking Halls cough drops and Nyquil.  Did an MD live visit and doxycyline was sent in but she did not start taking it.  She wasn't sure if she should. Current Outpatient Medications on File Prior to Visit  Medication Sig Dispense Refill  . citalopram (CELEXA) 20 MG tablet Take 1 tablet (20 mg total) by mouth daily. 30 tablet 3  . Norethindrone-Ethinyl Estradiol-Fe Biphas (LO LOESTRIN FE) 1 MG-10 MCG / 10 MCG tablet Take 1 tablet by mouth daily. MUST SCHEDULE ANNUAL EXAM 28 tablet 1  . [DISCONTINUED] traZODone (DESYREL) 50 MG tablet Take 1 tablet (50 mg total) by mouth at bedtime. 30 tablet 0   No current facility-administered medications on file prior to visit.     Allergies  Allergen Reactions  . Amoxicillin Rash    REACTION: rash  . Penicillins Rash    No past medical history on file.  Past Surgical History:  Procedure Laterality Date  . TYMPANOSTOMY TUBE PLACEMENT    . WISDOM TOOTH EXTRACTION      Family History  Problem Relation Age of Onset  . Hypertension Mother   . Thyroid disease Mother   . Hypertension Maternal Aunt   . Hypertension Maternal Uncle   . Heart disease Maternal Grandmother   . Cancer Maternal Grandmother        Breast    Social History   Socioeconomic History  . Marital status: Single    Spouse name: Not on file  . Number of children: Not on file  . Years of education: Not on file  . Highest education level: Not on file  Social Needs  . Financial resource strain: Not on file  . Food insecurity - worry: Not on file  . Food insecurity - inability: Not on file  . Transportation needs - medical: Not on file  . Transportation needs - non-medical: Not on file  Occupational History  . Not on file  Tobacco Use  . Smoking status: Never Smoker  .  Smokeless tobacco: Never Used  Substance and Sexual Activity  . Alcohol use: Yes    Alcohol/week: 0.0 oz    Comment: occasional  . Drug use: No  . Sexual activity: Yes    Birth control/protection: Pill  Other Topics Concern  . Not on file  Social History Narrative  . Not on file   The PMH, PSH, Social History, Family History, Medications, and allergies [facility], and have been updated if relevant.  OBJECTIVE:   BP (!) 142/100 (BP Location: Right Arm, Patient Position: Sitting, Cuff Size: Normal)   Pulse 92   Temp 98.1 F (36.7 C) (Oral)   Ht 5\' 9"  (1.753 m)   Wt 161 lb 1.9 oz (73.1 kg)   SpO2 98%   BMI 23.79 kg/m   Vitals as noted above. Appears alert, well appearing, and in no distress and oriented to person, place, and time. Ears: bilateral TM's and external ear canals normal Oropharynx: erythematous Neck: supple, no significant adenopathy Lungs: clear to auscultation, no wheezes, rales or rhonchi, symmetric air entry Rapid Strep test is negative  ASSESSMENT: Upper resp- likely viral  PLAN: Per orders. Gargle, use acetaminophen or other OTC analgesic.  Call if other family members develop similar symptoms. See prn.

## 2017-08-26 DIAGNOSIS — Z3202 Encounter for pregnancy test, result negative: Secondary | ICD-10-CM | POA: Diagnosis not present

## 2017-08-26 DIAGNOSIS — Z30017 Encounter for initial prescription of implantable subdermal contraceptive: Secondary | ICD-10-CM | POA: Diagnosis not present

## 2017-09-08 ENCOUNTER — Telehealth: Payer: Self-pay

## 2017-09-08 ENCOUNTER — Ambulatory Visit: Payer: Self-pay | Admitting: Internal Medicine

## 2017-09-08 NOTE — Telephone Encounter (Signed)
Pt cancelled appt for today at 12:15 with Pamala Hurry Baity NP for personal visit; do you want to charge a late cancellation fee and does pt need to reschedule?

## 2017-09-08 NOTE — Telephone Encounter (Signed)
Copied from CRM [id number]. Topic: Quick Communication - Appointment Cancellation >> Sep 08, 2017  9:17 AM Arlyss Gandyichardson, Taren N, NT wrote: Patient called to cancel appointment scheduled for today. Patient has not rescheduled their appointment.    Route to department's PEC pool.

## 2017-09-08 NOTE — Telephone Encounter (Signed)
No cancellation fee unless slot does not get filled. No need to reschedule.

## 2017-09-15 MED FILL — CITALOPRAM HBR 20 MG TABLET: 20 | 30 days supply | Qty: 30 | Fill #3

## 2017-11-19 MED FILL — BENZONATATE 100 MG CAPS: 100 | 3 days supply | Qty: 20 | Fill #0

## 2017-11-19 MED FILL — CEFDINIR 300 MG CAPSULE: 300 | 10 days supply | Qty: 20 | Fill #0

## 2017-11-27 ENCOUNTER — Ambulatory Visit (INDEPENDENT_AMBULATORY_CARE_PROVIDER_SITE_OTHER): Payer: No Typology Code available for payment source

## 2017-11-27 DIAGNOSIS — Z23 Encounter for immunization: Secondary | ICD-10-CM | POA: Diagnosis not present

## 2017-11-30 ENCOUNTER — Encounter: Payer: Self-pay | Admitting: Family Medicine

## 2017-12-01 NOTE — Telephone Encounter (Signed)
Mom calling to ask for the letter.  Advised mom of message from the office. Pt has made an appt for Wed. 2/20.

## 2017-12-02 ENCOUNTER — Ambulatory Visit (INDEPENDENT_AMBULATORY_CARE_PROVIDER_SITE_OTHER): Payer: No Typology Code available for payment source | Admitting: Family Medicine

## 2017-12-02 ENCOUNTER — Encounter: Payer: Self-pay | Admitting: Family Medicine

## 2017-12-02 VITALS — BP 130/102 | HR 107 | Temp 98.6°F | Ht 69.0 in | Wt 165.0 lb

## 2017-12-02 DIAGNOSIS — R030 Elevated blood-pressure reading, without diagnosis of hypertension: Secondary | ICD-10-CM | POA: Diagnosis not present

## 2017-12-02 MED ORDER — HYDROCHLOROTHIAZIDE 12.5 MG PO CAPS: 12.5000 mg | ORAL_CAPSULE | Freq: Every day | ORAL | 1 refills | Status: DC

## 2017-12-02 MED FILL — HYDROCHLOROTHIAZIDE 12.5 MG: 12.5 | 30 days supply | Qty: 30 | Fill #0

## 2017-12-02 NOTE — Assessment & Plan Note (Signed)
Persistent. Start HCTZ 12.5 mg daily. Renal artery US given age. She will monitor BP at home and keep me updated.

## 2017-12-02 NOTE — Progress Notes (Signed)
Subjective:   Patient ID: Sherri Harris, female    DOB: [birth year], 22 y.o.   MRN: [id number]  Sherri Harris is a pleasant 22 y.o. year old female who presents to clinic today with Hypertension (Patient is here today for a F/U with BP.  )  on 12/02/2017  HPI:  HTN-  BP remains elevated despite changing her birth control. She eats vegan now, exercises, minimal salt. Strong FH of HTN.  No HA, blurred vision, CP or SOB.    Current Outpatient Medications on File Prior to Visit  Medication Sig Dispense Refill  . etonogestrel (NEXPLANON) 68 MG IMPL implant 1 each by Subdermal route once.    . [DISCONTINUED] traZODone (DESYREL) 50 MG tablet Take 1 tablet (50 mg total) by mouth at bedtime. 30 tablet 0   No current facility-administered medications on file prior to visit.     Allergies  Allergen Reactions  . Amoxicillin Rash    REACTION: rash  . Penicillins Rash    No past medical history on file.  Past Surgical History:  Procedure Laterality Date  . TYMPANOSTOMY TUBE PLACEMENT    . WISDOM TOOTH EXTRACTION      Family History  Problem Relation Age of Onset  . Hypertension Mother   . Thyroid disease Mother   . Hypertension Maternal Aunt   . Hypertension Maternal Uncle   . Heart disease Maternal Grandmother   . Cancer Maternal Grandmother        Breast    Social History   Socioeconomic History  . Marital status: Single    Spouse name: Not on file  . Number of children: Not on file  . Years of education: Not on file  . Highest education level: Not on file  Social Needs  . Financial resource strain: Not on file  . Food insecurity - worry: Not on file  . Food insecurity - inability: Not on file  . Transportation needs - medical: Not on file  . Transportation needs - non-medical: Not on file  Occupational History  . Not on file  Tobacco Use  . Smoking status: Never Smoker  . Smokeless tobacco: Never Used  Substance and Sexual Activity  . Alcohol use: Yes    Alcohol/week: 0.0 oz    Comment: occasional  . Drug use: No  . Sexual activity: Yes    Birth control/protection: Pill  Other Topics Concern  . Not on file  Social History Narrative  . Not on file   The PMH, PSH, Social History, Family History, Medications, and allergies have been reviewed in [facility], and have been updated if relevant.  Review of Systems  Constitutional: Negative.   Respiratory: Negative.   Cardiovascular: Negative.   Genitourinary: Negative.   Musculoskeletal: Negative.   Neurological: Negative.   Psychiatric/Behavioral: Negative.   All other systems reviewed and are negative.      Objective:    BP (!) 130/102 (BP Location: Left Arm, Patient Position: Sitting, Cuff Size: Normal)   Pulse (!) 107   Temp 98.6 F (37 C) (Oral)   Ht 5\' 9"  (1.753 m)   Wt 165 lb (74.8 kg)   SpO2 94%   BMI 24.37 kg/m    Physical Exam   General:  Well-developed,well-nourished,in no acute distress; alert,appropriate and cooperative throughout examination Head:  normocephalic and atraumatic.   Eyes:  vision grossly intact, PERRL Ears:  R ear normal and L ear normal externally, TMs clear bilaterally Nose:  no external deformity.   Mouth:  good dentition.   Neck:  No deformities, masses, or tenderness noted. Lungs:  Normal respiratory effort, chest expands symmetrically. Lungs are clear to auscultation, no crackles or wheezes. Heart:  Normal rate and regular rhythm. S1 and S2 normal without gallop, murmur, click, rub or other extra sounds. Msk:  No deformity or scoliosis noted of thoracic or lumbar spine.   Extremities:  No clubbing, cyanosis, edema, or deformity noted with normal full range of motion of all joints.   Neurologic:  alert & oriented X3 and gait normal.   Skin:  Intact without suspicious lesions or rashes Psych:  Cognition and judgment appear intact. Alert and cooperative with normal attention span and concentration. No apparent delusions, illusions,  hallucinations       Assessment & Plan:   Elevated blood pressure reading No Follow-up on file.

## 2017-12-02 NOTE — Addendum Note (Signed)
Addended by: Dianne DunARON, Jaklyn Alen M on: 12/02/2017 03:27 PM   Modules accepted: Orders

## 2017-12-02 NOTE — Patient Instructions (Signed)
Great to see you.  We are starting HCTZ 12.5 mg daily. Please check blood pressures at home and keep me updated.

## 2017-12-03 ENCOUNTER — Encounter: Payer: Self-pay | Admitting: Family Medicine

## 2017-12-23 ENCOUNTER — Encounter: Payer: Self-pay | Admitting: Family Medicine

## 2017-12-25 ENCOUNTER — Other Ambulatory Visit: Payer: Self-pay | Admitting: Emergency Medicine

## 2017-12-25 DIAGNOSIS — I10 Essential (primary) hypertension: Secondary | ICD-10-CM

## 2017-12-25 MED FILL — HYDROCHLOROTHIAZIDE 12.5 MG: 12.5 | 30 days supply | Qty: 30 | Fill #1

## 2017-12-25 NOTE — Telephone Encounter (Signed)
Referral has been placed. Nothing further needed.   

## 2018-01-21 NOTE — Progress Notes (Addendum)
Cardiology Office Note:    Date:  01/22/2018   ID:  Sherri Harris, DOB 06-04-1996, MRN [id number]  PCP:  Dianne Dun, MD  Cardiologist:  Regan Lemming, MD   Referring MD: Dianne Dun, MD   Chief Complaint  Patient presents with  . New Patient (Initial Visit)    hypertension    History of Present Illness:    Sherri Harris is a 22 y.o. female with a hx of anxiety and cervical kyphosis. She was recently diagnosed with HTN, including elevated diastolic pressure. PCP started her on HCTZ. Her pressure at previous office visit was 142/100 (08/18/17 - no antihypertensives and on loestrin oral contraceptive).  Birth control was changed to nexplanon for elevated pressure. On follow up 12/02/17 with PCP, pressure was 130/102. She was started on 12.5 mg HCTZ at that visit.   She presents today for a new patient visit. She was referred by her PCP for pressure control. Her main complaints today are high blood pressure. She has a history of anxiety and started noticing elevated blood pressure about 1 year ago. She was followed by her PCP for this. She tried citalopram for her anxiety. This seemed to help her anxiety, but did not help her blood pressure. She discontinued use for side effects and to decrease the number of pills she takes. She takes her pressure at home and it is generally 150s/100s. She reports headaches, but denies other neurological deficits, including vision changes. She has also been extremely fatigued for the past two years. This seems to be her baseline. She reports chest tightness and feelings of heart racing daily. The chest tightness occurs with rest and with exertion, lasts 1-2 minutes at a time, and is associated with heart racing. No aggravating or relieving symptoms. She also states that she sweats easily at rest and gets flush on her chest.   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 She states her anxiety  is fairly well-controlled right now and does not want additional medicine for this at this time. She follows a vegan diet. She is a nonsmoker and denies illicit drug use. She drinks alcohol socially. She walks 1-2 miles, or at least 30 min per day, with her dog.    Past Medical History:  Diagnosis Date  . Anxiety   . Hypertension     Past Surgical History:  Procedure Laterality Date  . TYMPANOSTOMY TUBE PLACEMENT    . WISDOM TOOTH EXTRACTION      Current Medications: Current Meds  Medication Sig  . etonogestrel (NEXPLANON) 68 MG IMPL implant 1 each by Subdermal route once.  . hydrochlorothiazide (MICROZIDE) 12.5 MG capsule Take 1 capsule (12.5 mg total) by mouth daily.     Allergies:   Amoxicillin and Penicillins   Social History   Socioeconomic History  . Marital status: Single    Spouse name: Not on file  . Number of children: Not on file  . Years of education: Not on file  . Highest education level: Not on file  Occupational History  . Not on file  Social Needs  . Financial resource strain: Not on file  . Food insecurity:    Worry: Not on file    Inability: Not on file  . Transportation needs:    Medical: Not on file    Non-medical: Not on file  Tobacco Use  . Smoking status: Never Smoker  .  Smokeless tobacco: Never Used  Substance and Sexual Activity  . Alcohol use: Yes    Alcohol/week: 0.0 oz    Comment: occasional  . Drug use: No  . Sexual activity: Yes    Birth control/protection: Pill  Lifestyle  . Physical activity:    Days per week: Not on file    Minutes per session: Not on file  . Stress: Not on file  Relationships  . Social connections:    Talks on phone: Not on file    Gets together: Not on file    Attends religious service: Not on file    Active member of club or organization: Not on file    Attends meetings of clubs or organizations: Not on file    Relationship status: Not on file  Other Topics Concern  . Not on file  Social History  Narrative  . Not on file     Family History: The patient's family history includes Cancer in her maternal grandmother; Cardiomyopathy in her father; Heart disease in her maternal grandmother; Hypertension in her father, maternal aunt, maternal uncle, and mother; Thyroid disease in her mother.  ROS:   Please see the history of present illness.    All other systems reviewed and are negative.  EKGs/Labs/Other Studies Reviewed:    The following studies were reviewed today:  Renal ultrasound Normal caliber abdominal aorta. Normal and symmetrical kidney size. Normal renal arteries, bilaterally. The IVC and renal veins are patent.   EKG:  EKG is ordered today.  The ekg ordered today demonstrates sinus, HR 97, TWI in inferior leads and V3-5.    Recent Labs: No results found for requested labs within last 8760 hours.  Recent Lipid Panel    Component Value Date/Time   CHOL 188 09/08/2016 1402   TRIG 179.0 (H) 09/08/2016 1402   HDL 76.00 09/08/2016 1402   CHOLHDL 2 09/08/2016 1402   VLDL 35.8 09/08/2016 1402   LDLCALC 76 09/08/2016 1402    Physical Exam:    VS:  BP (!) 136/96   Pulse 97   Ht 5\' 10"  (1.778 m)   Wt 167 lb 6.4 oz (75.9 kg)   SpO2 99%   BMI 24.02 kg/m     Wt Readings from Last 3 Encounters:  01/22/18 167 lb 6.4 oz (75.9 kg)  12/02/17 165 lb (74.8 kg)  08/18/17 161 lb 1.9 oz (73.1 kg)     GEN: Well nourished, well developed in no acute distress HEENT: Normal NECK: No JVD; No carotid bruits LYMPHATICS: No lymphadenopathy CARDIAC: regular rhythm, tachycardic rate, no murmurs, rubs, gallops RESPIRATORY:  Clear to auscultation without rales, wheezing or rhonchi  ABDOMEN: Soft, non-tender, non-distended MUSCULOSKELETAL:  No edema; No deformity  SKIN: Warm and dry NEUROLOGIC:  Alert and oriented x 3 PSYCHIATRIC:  Normal affect   ASSESSMENT:    1. Essential hypertension   2. Chest pain, unspecified type   3. Fatigue, unspecified type   4. Tachycardia     5. Sweating abnormality    PLAN:    In order of problems listed above:  Essential hypertension  Chest pain, unspecified type Fatigue, unspecified type Tachycardia Sweating abnormality   Case was discussed with Dr. Elberta Fortisamnitz, DOD.   EKG with TWI inferior leads and precordial leads. This is unchanged from EKG one year ago. HR 97.   Given her hypertension, headache, sweating, and tachycardia, will check plasma metanephrines to rule out pheochromocytoma. Will also obtain an echocardiogram for structure and function. Will check TSH, BMP,  Magnesium, and CBC to look for reversible causes of her palpitations/heart racing/tachycardia. Given her young age and elevated BP despite HCTZ, will collect a 24 hr urine to evaluate adrenal insufficiency.   Will also add 5 mg norvasc. I avoided beta blockers due to her baseline fatigue. Although, beta blockers may be beneficial for palpitations and anxiety problems. She will bring a blood pressure log to her next visit. We also discussed avoiding salt.  If the above is normal, would consider 48 hr holter monitor for palpitations/tachycardia.  Would avoid estrogen-based birth control.    Medication Adjustments/Labs and Tests Ordered: Current medicines are reviewed at length with the patient today.  Concerns regarding medicines are outlined above.  Orders Placed This Encounter  Procedures  . CBC  . Basic metabolic panel  . TSH  . Magnesium  . Metanephrines, urine, 24 hour  . Metanephrines, plasma  . EXERCISE TOLERANCE TEST (ETT)  . EKG 12-Lead  . ECHOCARDIOGRAM COMPLETE   Meds ordered this encounter  Medications  . amLODipine (NORVASC) 5 MG tablet    Sig: Take 1 tablet (5 mg total) by mouth daily.    Dispense:  30 tablet    Refill:  5    Signed, Marcelino Duster, [place]  01/22/2018 10:13 AM    [facility]  Will Elberta Fortis, MD

## 2018-01-22 ENCOUNTER — Encounter: Payer: Self-pay | Admitting: Cardiology

## 2018-01-22 ENCOUNTER — Ambulatory Visit (INDEPENDENT_AMBULATORY_CARE_PROVIDER_SITE_OTHER): Payer: No Typology Code available for payment source | Admitting: Physician Assistant

## 2018-01-22 ENCOUNTER — Encounter: Payer: Self-pay | Admitting: *Deleted

## 2018-01-22 VITALS — BP 136/96 | HR 97 | Ht 70.0 in | Wt 167.4 lb

## 2018-01-22 DIAGNOSIS — L749 Eccrine sweat disorder, unspecified: Secondary | ICD-10-CM

## 2018-01-22 DIAGNOSIS — I10 Essential (primary) hypertension: Secondary | ICD-10-CM | POA: Diagnosis not present

## 2018-01-22 DIAGNOSIS — R079 Chest pain, unspecified: Secondary | ICD-10-CM | POA: Diagnosis not present

## 2018-01-22 DIAGNOSIS — R000 Tachycardia, unspecified: Secondary | ICD-10-CM

## 2018-01-22 DIAGNOSIS — R5383 Other fatigue: Secondary | ICD-10-CM | POA: Diagnosis not present

## 2018-01-22 LAB — BASIC METABOLIC PANEL
BUN/Creatinine Ratio: 10 (ref 9–23)
BUN: 9 mg/dL (ref 6–20)
CALCIUM: 9.9 mg/dL (ref 8.7–10.2)
CHLORIDE: 97 mmol/L (ref 96–106)
CO2: 24 mmol/L (ref 20–29)
Creatinine, Ser: 0.91 mg/dL (ref 0.57–1.00)
GFR calc Af Amer: 103 mL/min/{1.73_m2} (ref >59)
GFR calc non Af Amer: 89 mL/min/{1.73_m2} (ref >59)
Glucose: 90 mg/dL (ref 65–99)
POTASSIUM: 3.9 mmol/L (ref 3.5–5.2)
SODIUM: 138 mmol/L (ref 134–144)

## 2018-01-22 LAB — CBC
Hematocrit: 42.2 % (ref 34.0–46.6)
Hemoglobin: 14.5 g/dL (ref 11.1–15.9)
MCH: 31 pg (ref 26.6–33.0)
MCHC: 34.4 g/dL (ref 31.5–35.7)
MCV: 90 fL (ref 79–97)
PLATELETS: 337 10*3/uL (ref 150–379)
RBC: 4.67 x10E6/uL (ref 3.77–5.28)
RDW: 12.4 % (ref 12.3–15.4)
WBC: 6.6 10*3/uL (ref 3.4–10.8)

## 2018-01-22 LAB — MAGNESIUM: Magnesium: 2.2 mg/dL (ref 1.6–2.3)

## 2018-01-22 LAB — TSH: TSH: 1.26 u[IU]/mL (ref 0.450–4.500)

## 2018-01-22 MED ORDER — AMLODIPINE BESYLATE 5 MG PO TABS: 5.0000 mg | ORAL_TABLET | Freq: Every day | ORAL | 5 refills | Status: DC

## 2018-01-22 NOTE — Patient Instructions (Addendum)
Medication Instructions:   START TAKING NORVASC 5 MG ONCE A DAY   ( MAKE SURE YOU AVOID ESTROGEN BASED BIRTH CONTROL)   If you need a refill on your cardiac medications before your next appointment, please call your pharmacy.  Labwork:  CBC BMET MAG AND TSH    Testing/Procedures:  Your physician has requested that you have an echocardiogram. Echocardiography is a painless test that uses sound waves to create images of your heart. It provides your doctor with information about the size and shape of your heart and how well your heart's chambers and valves are working. This procedure takes approximately one hour. There are no restrictions for this procedure.  Your physician has requested that you have an exercise tolerance test. For further information please visit [email or web address]. Please also follow instruction sheet, as given.   Follow-Up:  IN 2 TO 3 WEEKS WITH  Sherri Harris ON 02-09-18  OR 02-10-18 SHE IS WORKING WITH [employer]   Any Other Special Instructions Will Be Listed Below (If Applicable)  For the 24 hour urine collection: INTRODUCTION-The following instructions will guide you in the proper collection of a 24-hour urine specimen. In some instances, you will be asked to collect two or three consecutive 24-hour urine samples. INSTRUCTIONS ?You should collect every drop of urine during each 24-hour period. It does not matter how much or little urine is passed each time, as long as every drop is collected. ?Begin the urine collection in the morning after you wake up, after you have emptied your bladder for the first time. ?Urinate (empty the bladder) for the first time and flush it down the toilet. Note the exact time (eg, 6:15 AM). You will begin the urine collection at this time. ?Collect every drop of urine during the day and night in an empty collection bottle. Store the bottle at room temperature or in the refrigerator. ?If you need to have a bowel movement, any urine  passed with the bowel movement should be collected. Try not to include feces with the urine collection. If feces does get mixed in, do not try to remove the feces from the urine collection bottle. ?Finish by collecting the first urine passed the next morning, adding it to the collection bottle. This should be within ten minutes before or after the time of the first morning void on the first day (which was flushed). In this example, you would try to void between 6:05 and 6:25 on the second day. If you need to urinate one hour before the final collection time, drink a full glass of water so that you can void again at the appropriate time. If you have to urinate 20 minutes before, try to hold the urine until the proper time. Please note the exact time of the final collection, even if it is not the same time as when collection began on day 1. STORAGE-The bottle(s) may be kept at room temperature for a day or two, but should be kept cool or refrigerated for longer periods of time.

## 2018-01-27 LAB — METANEPHRINES, URINE, 24 HOUR
METANEPHRINES 24H UR: 53 ug/(24.h) (ref 45–290)
Metaneph Total, Ur: 35 ug/L
NORMETANEPHRINE UR: 219 ug/L
Normetanephrine, 24H Ur: 329 ug/24 hr (ref 82–500)

## 2018-01-27 LAB — METANEPHRINES, PLASMA
Metanephrine, Free: <10 pg/mL (ref 0–62)
NORMETANEPHRINE FREE: 80 pg/mL (ref 0–145)

## 2018-01-27 NOTE — Telephone Encounter (Signed)
-----   Message from Marcelino DusterAngela Nicole Duke, [place] sent at 01/27/2018  3:07 PM EDT ----- Your urine study and blood tests were normal.

## 2018-02-02 ENCOUNTER — Other Ambulatory Visit (HOSPITAL_COMMUNITY): Payer: Self-pay

## 2018-02-04 ENCOUNTER — Other Ambulatory Visit: Payer: Self-pay | Admitting: Family Medicine

## 2018-02-04 MED FILL — HYDROCHLOROTHIAZIDE 12.5 MG: 12.5 | 30 days supply | Qty: 30 | Fill #0

## 2018-02-05 ENCOUNTER — Encounter: Payer: Self-pay | Admitting: Physician Assistant

## 2018-02-17 ENCOUNTER — Other Ambulatory Visit: Payer: Self-pay

## 2018-02-17 ENCOUNTER — Ambulatory Visit (HOSPITAL_COMMUNITY): Payer: No Typology Code available for payment source | Attending: Cardiology

## 2018-02-17 ENCOUNTER — Telehealth: Payer: Self-pay | Admitting: Cardiology

## 2018-02-17 ENCOUNTER — Ambulatory Visit (INDEPENDENT_AMBULATORY_CARE_PROVIDER_SITE_OTHER): Payer: No Typology Code available for payment source

## 2018-02-17 DIAGNOSIS — R079 Chest pain, unspecified: Secondary | ICD-10-CM | POA: Insufficient documentation

## 2018-02-17 DIAGNOSIS — I10 Essential (primary) hypertension: Secondary | ICD-10-CM | POA: Diagnosis not present

## 2018-02-17 LAB — EXERCISE TOLERANCE TEST
CSEPEDS: 0 s
CSEPEW: 11.7 METS
CSEPHR: 91 %
CSEPPHR: 181 {beats}/min
Exercise duration (min): 10 min
MPHR: 197 {beats}/min
RPE: 17
Rest HR: 104 {beats}/min

## 2018-02-17 NOTE — Telephone Encounter (Signed)
Called pt and left message asking pt to call back to get Echo results.

## 2018-02-22 ENCOUNTER — Encounter: Payer: Self-pay | Admitting: Physician Assistant

## 2018-02-23 NOTE — Progress Notes (Signed)
Cardiology Office Note:    Date:  02/24/2018   ID:  Sherri Harris, DOB 02-17-1996, MRN [id number]  PCP:  Dianne Dun, MD  Cardiologist:  Regan Lemming, MD   Referring MD: Dianne Dun, MD   Chief Complaint  Patient presents with  . Hypertension    History of Present Illness:    Sherri Harris is a 22 y.o. female with a hx of hypertension and anxiety. She was recently seen as a new patient for hypertension. At that time, she also described chest tightness, palpitations, and feelings of heart racing. Metanephrines and 24 hr urine cortisol were negative. TSH and electrolytes were all normal. Echocardiogram showed normal structure and function. She underwent an exercise tolerance test with some EKG changes making the test non-conclusive. She was started on 5 mg norvasc in addition to her HCTZ for pressure control. I initially avoided a beta blocker in her because of complaints of fatigue.   She presents today for follow up, she is here again with her mother. She has been taking her pressures at home and they have been running in the 140s; however, her pressure here is 128/90. I suggested we get her home BP machine calibrated. She has been doing well on the norvasc and HCTZ and has felt much better. She continues to have heart racing, but less palpitations. She has occasional chest tightness, but no chest pain. She has been much better from an anxiety perspective since her 10-day vacation in [place] state. She continues to avoid salt, walks daily, and follows a healthy vegan diet. She and her mother state that over the past year she has had puffiness in her face and body that is worse in the evenings.    Past Medical History:  Diagnosis Date  . Anxiety   . Hypertension     Past Surgical History:  Procedure Laterality Date  . TYMPANOSTOMY TUBE PLACEMENT    . WISDOM TOOTH EXTRACTION      Current Medications: Current Meds  Medication Sig  . etonogestrel (NEXPLANON) 68 MG  IMPL implant 1 each by Subdermal route once.  . traZODone (DESYREL) 50 MG tablet Take 50 mg by mouth at bedtime.  . [DISCONTINUED] amLODipine (NORVASC) 5 MG tablet Take 1 tablet (5 mg total) by mouth daily.  . [DISCONTINUED] hydrochlorothiazide (MICROZIDE) 12.5 MG capsule TAKE 1 CAPSULE (12.5 MG TOTAL) BY MOUTH DAILY.     Allergies:   Amoxicillin and Penicillins   Social History   Socioeconomic History  . Marital status: Single    Spouse name: Not on file  . Number of children: Not on file  . Years of education: Not on file  . Highest education level: Not on file  Occupational History  . Not on file  Social Needs  . Financial resource strain: Not on file  . Food insecurity:    Worry: Not on file    Inability: Not on file  . Transportation needs:    Medical: Not on file    Non-medical: Not on file  Tobacco Use  . Smoking status: Never Smoker  . Smokeless tobacco: Never Used  Substance and Sexual Activity  . Alcohol use: Yes    Alcohol/week: 0.0 oz    Comment: occasional  . Drug use: No  . Sexual activity: Yes    Birth control/protection: Pill  Lifestyle  . Physical activity:    Days per week: Not on file    Minutes per session: Not on file  . Stress:  Not on file  Relationships  . Social connections:    Talks on phone: Not on file    Gets together: Not on file    Attends religious service: Not on file    Active member of club or organization: Not on file    Attends meetings of clubs or organizations: Not on file    Relationship status: Not on file  Other Topics Concern  . Not on file  Social History Narrative  . Not on file     Family History: The patient's family history includes Cancer in her maternal grandmother; Cardiomyopathy in her father; Heart disease in her maternal grandmother; Hypertension in her father, maternal aunt, maternal uncle, and mother; Thyroid disease in her mother.  ROS:   Please see the history of present illness.    All other systems  reviewed and are negative.  EKGs/Labs/Other Studies Reviewed:    The following studies were reviewed today:  ETT 02/17/18:  Blood pressure demonstrated a hypertensive response to exercise.  Downsloping ST segment depression ST segment depression was noted during stress, and returning to baseline after less than 1 minute of recovery.  Patient has T wave inversions present at baseline that were worse and associated with 1 mm ST segment depression in the first 2 minutes of testing. However, this returned to baseline and T wave inversion resolved with further exercise.  Suspect that this is negative for ischemia. However, study is non-conclusive due to baseline T wave abnormalities.   Echo 02/17/18: Study Conclusions - Left ventricle: The cavity size was normal. Wall thickness was   normal. Systolic function was normal. The estimated ejection   fraction was in the range of 55% to 60%. Wall motion was normal;   there were no regional wall motion abnormalities. Left   ventricular diastolic function parameters were normal.    EKG:  EKG is not ordered today.   Recent Labs: 01/22/2018: BUN 9; Creatinine, Ser 0.91; Hemoglobin 14.5; Magnesium 2.2; Platelets 337; Potassium 3.9; Sodium 138; TSH 1.260  Recent Lipid Panel    Component Value Date/Time   CHOL 188 09/08/2016 1402   TRIG 179.0 (H) 09/08/2016 1402   HDL 76.00 09/08/2016 1402   CHOLHDL 2 09/08/2016 1402   VLDL 35.8 09/08/2016 1402   LDLCALC 76 09/08/2016 1402    Physical Exam:    VS:  BP 128/90   Pulse 92   Ht 5' 9.5" (1.765 m)   Wt 168 lb 6.4 oz (76.4 kg)   SpO2 95%   BMI 24.51 kg/m     Wt Readings from Last 3 Encounters:  02/24/18 168 lb 6.4 oz (76.4 kg)  01/22/18 167 lb 6.4 oz (75.9 kg)  12/02/17 165 lb (74.8 kg)     GEN: Well nourished, well developed in no acute distress HEENT: Normal NECK: No JVD; No carotid bruits LYMPHATICS: No lymphadenopathy CARDIAC: RRR, no murmurs, rubs, gallops RESPIRATORY:  Clear  to auscultation without rales, wheezing or rhonchi  ABDOMEN: Soft, non-tender, non-distended MUSCULOSKELETAL:  No edema; No deformity  SKIN: Warm and dry NEUROLOGIC:  Alert and oriented x 3 PSYCHIATRIC:  Normal affect   ASSESSMENT:    1. Essential hypertension   2. Anxiety   3. Tachycardia   4. Chest tightness    PLAN:    In order of problems listed above:  Essential hypertension - Plan: Aldosterone + renin activity w/ ratio, Urinalysis, T4, free TSH, electrolytes, and echo were all normal. Her pressures have been running in the 140s at home,  but is 128/90 here. I have asked her to get her machine calibrated with a manual cuff. Because she continues to describe rapid heart rate and puffiness, I will stop norvasc and HCTZ. Will start coreg 12.5 mg BID. If her pressure is still not controlled, will add back HCTZ vs an ARB. Will also collect a free T4, renin/aldosterone ratio, and UA to check for proteinuria.  She and her mother describe facial "puffiness." I discussed a consult to endocrinology to rule out Cushing's, but she would like to hold off on that for now. The norvasc may have been contributory, but she reports puffiness started prior to addition of norvasc.  If HTN not controlled, may consider a sleep study in the future to rule out central apnea.    Anxiety She states her anxiety has been much better since her vacation.    Tachycardia HR is 92 here. Added beta blocker as above. If she has a recurrence of palpitations, or develops dizziness or syncope, will consider event monitor.    Chest tightness Her chest tightness has improved. Her ETT was non-conclusive. In consultation with Dr. Katrinka Blazing (DOD) we will hold off on a coronary CT at this time. If she has a recurrence of chest pain or develops exertional chest pain, will have a low threshold for coronary CT. Will need lopressor if heart rate is not lower on coreg.    Follow up with Dr. Elberta Fortis in 6 months, sooner if needed or  if pressures are still uncontrolled.   Medication Adjustments/Labs and Tests Ordered: Current medicines are reviewed at length with the patient today.  Concerns regarding medicines are outlined above.  Orders Placed This Encounter  Procedures  . Aldosterone + renin activity w/ ratio  . Urinalysis  . T4, free   Meds ordered this encounter  Medications  . carvedilol (COREG) 12.5 MG tablet    Sig: TAKE 1/2 TABLET BY MOUTH TWICE A DAY FOR 1 WEEK THEN TAKE 1 TABLET BY  MOUTH TWICE A WEEK    Dispense:  180 tablet    Refill:  3    Signed, Marcelino Duster, [place]  02/24/2018 9:54 AM    [facility]

## 2018-02-24 ENCOUNTER — Encounter: Payer: Self-pay | Admitting: Physician Assistant

## 2018-02-24 ENCOUNTER — Ambulatory Visit (INDEPENDENT_AMBULATORY_CARE_PROVIDER_SITE_OTHER): Payer: No Typology Code available for payment source | Admitting: Physician Assistant

## 2018-02-24 VITALS — BP 128/90 | HR 92 | Ht 69.5 in | Wt 168.4 lb

## 2018-02-24 DIAGNOSIS — F419 Anxiety disorder, unspecified: Secondary | ICD-10-CM | POA: Diagnosis not present

## 2018-02-24 DIAGNOSIS — R0789 Other chest pain: Secondary | ICD-10-CM | POA: Diagnosis not present

## 2018-02-24 DIAGNOSIS — I10 Essential (primary) hypertension: Secondary | ICD-10-CM | POA: Diagnosis not present

## 2018-02-24 DIAGNOSIS — R000 Tachycardia, unspecified: Secondary | ICD-10-CM | POA: Diagnosis not present

## 2018-02-24 MED ORDER — CARVEDILOL 12.5 MG PO TABS: ORAL_TABLET | ORAL | 3 refills | Status: DC

## 2018-02-24 MED FILL — CARVEDILOL 12.5 MG TABLET: 12.5 | 90 days supply | Qty: 180 | Fill #0

## 2018-02-24 NOTE — Addendum Note (Signed)
Addended by: Burnetta Sabin on: 02/24/2018 10:13 AM   Modules accepted: Orders

## 2018-02-24 NOTE — Patient Instructions (Addendum)
Medication Instructions:  Your physician has recommended you make the following change in your medication:  1.  STOP the Norvasc 2.  STOP the Hydrochlorothiazide 3.  START Coreg 12.5 mg taking 1/2 tablet twice a day for 1 week then go up to 1 tablet by mouth twice a day   Labwork: TODAY:  UA, FREE T4, & RENIN ALDOSTERONE   Testing/Procedures: None ordered  Follow-Up: Your physician wants you to follow-up in: 6 MONTHS WITH DR. Elberta Fortis   You will receive a reminder letter in the mail two months in advance. If you don't receive a letter, please call our office to schedule the follow-up appointment.    Any Other Special Instructions Will Be Listed Below (If Applicable). - Make sure to have your blood pressure calibrated - If you notice your notice your blood pressures is still running higher, give [name] a call and we may need to make some adjustments or bring you back in for a visit.    If you need a refill on your cardiac medications before your next appointment, please call your pharmacy.

## 2018-02-25 LAB — URINALYSIS
Bilirubin, UA: NEGATIVE
GLUCOSE, UA: NEGATIVE
Ketones, UA: NEGATIVE
Leukocytes, UA: NEGATIVE
Nitrite, UA: NEGATIVE
PH UA: 6 (ref 5.0–7.5)
PROTEIN UA: NEGATIVE
RBC UA: NEGATIVE
SPEC GRAV UA: 1.008 (ref 1.005–1.030)
Urobilinogen, Ur: 0.2 mg/dL (ref 0.2–1.0)

## 2018-02-27 LAB — ALDOSTERONE + RENIN ACTIVITY W/ RATIO
ALDOS/RENIN RATIO: 8.3 (ref 0.0–30.0)
ALDOSTERONE: 31.7 ng/dL — AB (ref 0.0–30.0)
RENIN: 3.808 ng/mL/h (ref 0.167–5.380)

## 2018-02-27 LAB — T4, FREE: Free T4: 1.25 ng/dL (ref 0.82–1.77)

## 2018-03-24 IMAGING — DX DG CERVICAL SPINE COMPLETE 4+V
5 series · 5 of 5 positions shown · non-contrast
Comparison: None.

CLINICAL DATA: 19-year-old female with cervical region kyphosis.
Initial encounter.

EXAM:
CERVICAL SPINE - COMPLETE 4+ VIEW

[c-spine lat]
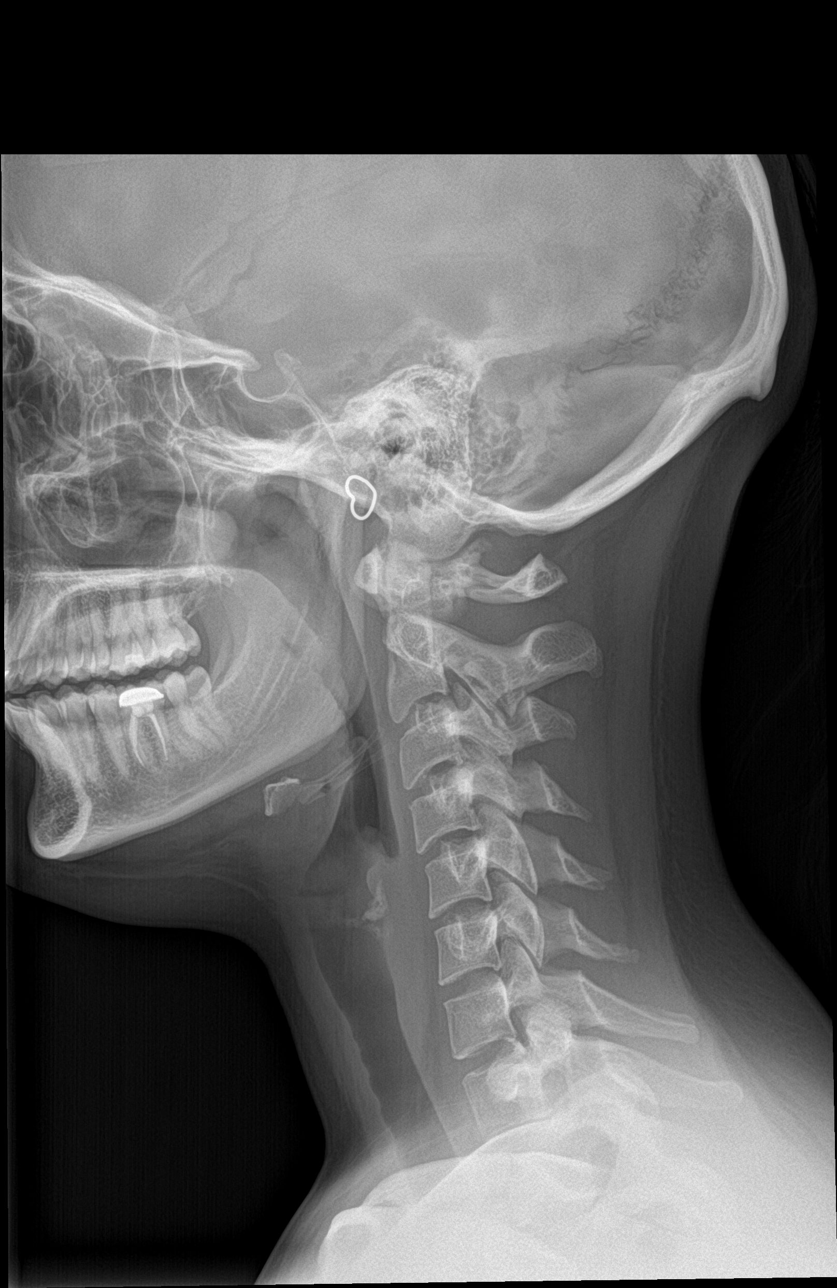

[c-spine obl (1 of 2)]
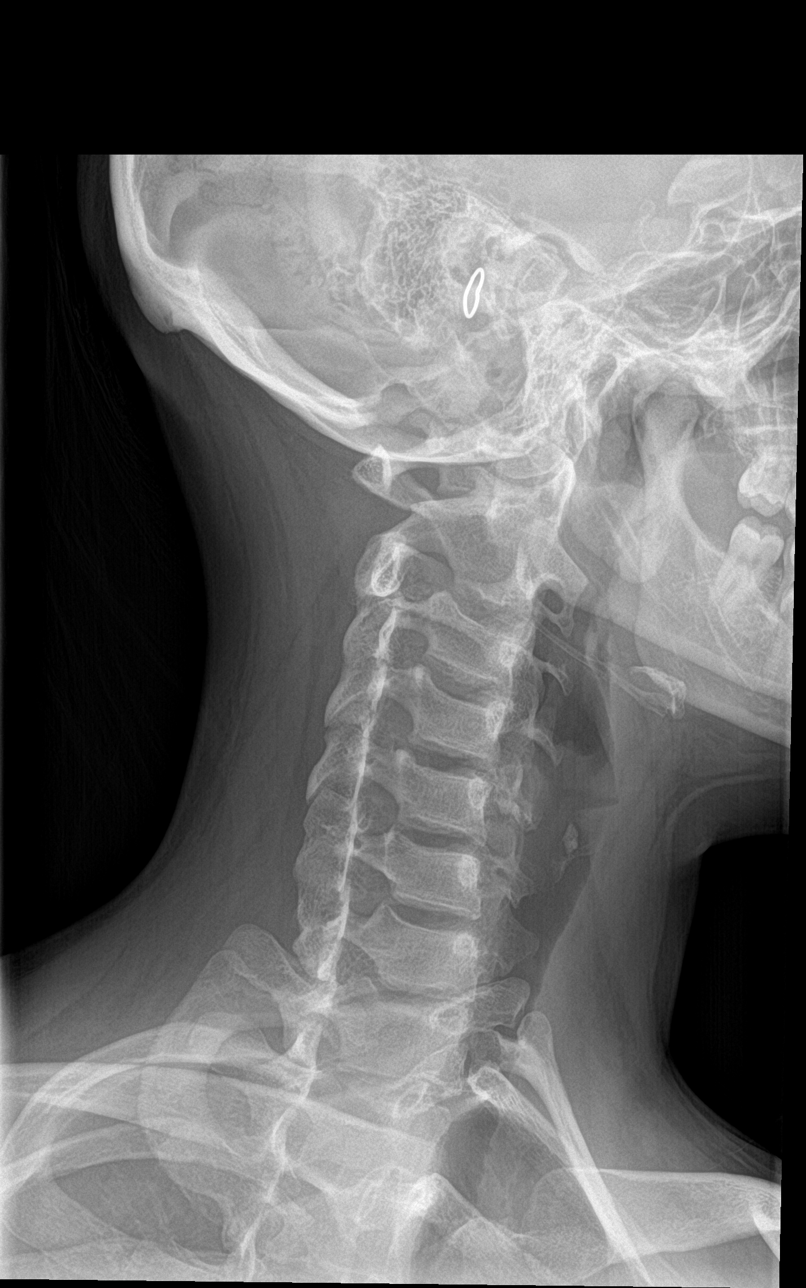

[c-spine obl (2 of 2)]
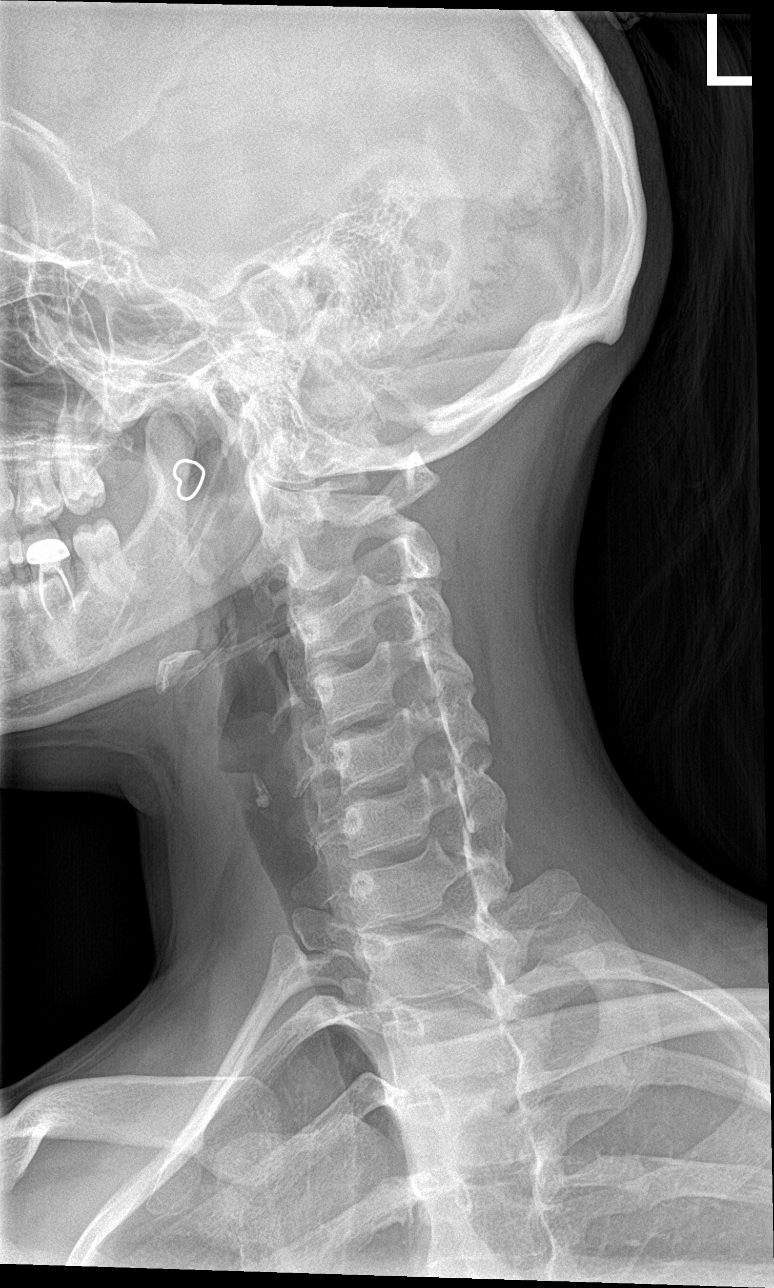

[c-spine ap]
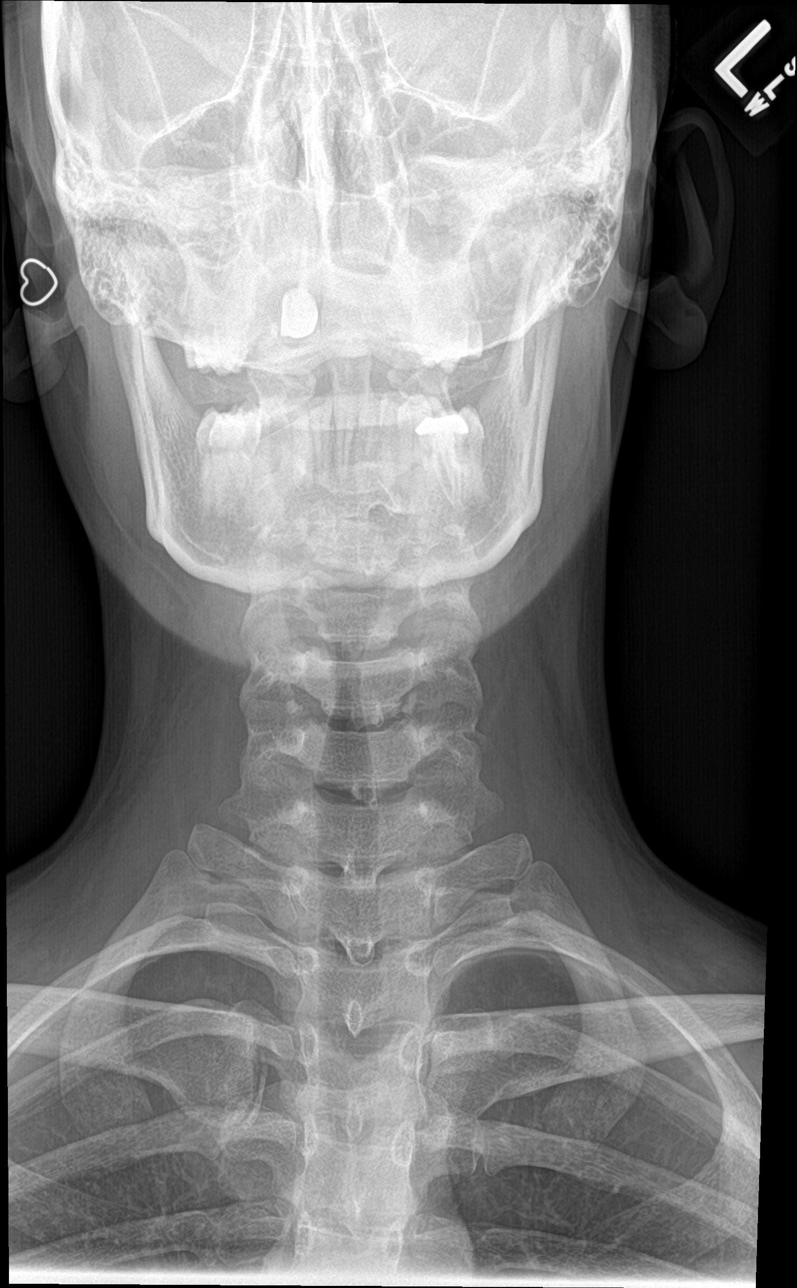

[c-spine open mouth]
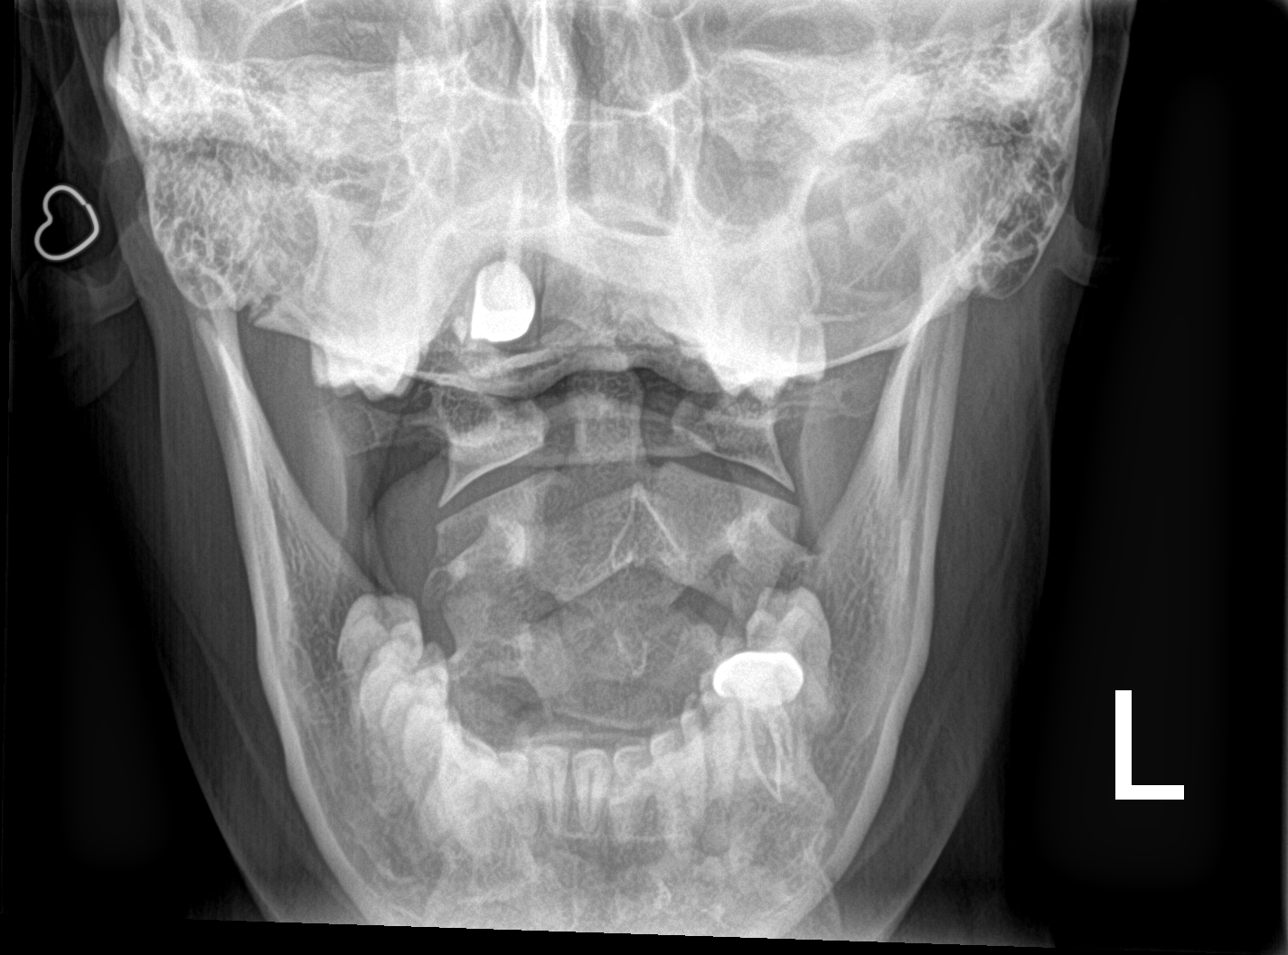

[5 of 5 positions shown; findings below may reference images not displayed]

FINDINGS: Normal prevertebral soft tissue contour. Straightening and mild
reversal of cervical lordosis, with otherwise normal cervical
vertebral height and alignment. Cervical disc spaces appear
preserved. Cervicothoracic junction alignment is within normal
limits. Bilateral posterior element alignment is within normal
limits. Normal C1-C2 alignment. Normal AP alignment and lung apices.
Bone mineralization is within normal limits. No acute osseous
abnormality identified.
IMPRESSION: Normal radiographic appearance of the cervical spine aside from
nonspecific straightening and up to mild reversal of cervical
lordosis.

## 2018-04-05 ENCOUNTER — Encounter (HOSPITAL_COMMUNITY): Payer: Self-pay | Admitting: Emergency Medicine

## 2018-04-05 ENCOUNTER — Ambulatory Visit (HOSPITAL_COMMUNITY)
Admission: EM | Admit: 2018-04-05 | Discharge: 2018-04-05 | Disposition: A | Discharge status: Home / Self Care | Payer: No Typology Code available for payment source | Attending: Family Medicine | Admitting: Family Medicine

## 2018-04-05 DIAGNOSIS — S70362A Insect bite (nonvenomous), left thigh, initial encounter: Secondary | ICD-10-CM

## 2018-04-05 DIAGNOSIS — Z91038 Other insect allergy status: Secondary | ICD-10-CM

## 2018-04-05 DIAGNOSIS — IMO0001 Reserved for inherently not codable concepts without codable children: Secondary | ICD-10-CM

## 2018-04-05 MED ORDER — PREDNISONE 20 MG PO TABS: 20.0000 mg | ORAL_TABLET | Freq: Two times a day (BID) | ORAL | 0 refills | Status: DC

## 2018-04-05 MED FILL — predniSONE 10 MG TABS: 10 | 5 days supply | Qty: 20 | Fill #0

## 2018-04-05 NOTE — Discharge Instructions (Signed)
Take an antihistamine for itching You may use Benadryl at night and a nondrowsy antihistamine like Claritin or Zyrtec during the day Take the prednisone twice a day for 5 days.  Take 2 tablets today

## 2018-04-05 NOTE — ED Triage Notes (Signed)
Pt states she noticed and insect bite on her L upper thigh Friday and its big and not getting better.

## 2018-04-05 NOTE — ED Provider Notes (Signed)
[facility]    CSN: [id number] Arrival date & time: 04/05/18  1240     History   Chief Complaint No chief complaint on file.   HPI Sherri Harris is a 22 y.o. female.   HPI  INSECT BITE TO THIGH 2 DAYS AGO Felt an immediate sting.  Did not see the insect.  No known allergies to any insects in the past.  Took Benadryl at night.  Within 30 minutes to an hour noticed soft tissue swelling and redness around the site.  No shortness of breath.  No difficulty speaking.  No difficulty swallowing.  The area is less swollen today, but appears to be growing in size as it dissipates.  The redness is increased.  It itches terribly.  No pain.  No fever chills.  No myalgias.  Past Medical History:  Diagnosis Date  . Anxiety   . Hypertension     Patient Active Problem List   Diagnosis Date Noted  . Anxiety 06/17/2017  . Elevated blood pressure reading 11/10/2016  . 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 Contraception management 07/23/2016    Past Surgical History:  Procedure Laterality Date  . TYMPANOSTOMY TUBE PLACEMENT    . WISDOM TOOTH EXTRACTION      OB History   None      Home Medications    Prior to Admission medications   Medication Sig Start Date End Date Taking? Authorizing Provider  carvedilol (COREG) 12.5 MG tablet Take 1/2 tablet by mouth twice a day for 1 week then take 1 tablet by mouth twice a day 02/24/18   Marcelino Dusteruke, Angela Nicole, PA  etonogestrel (NEXPLANON) 68 MG IMPL implant 1 each by Subdermal route once.    [provider]  predniSONE (DELTASONE) 20 MG tablet Take 1 tablet (20 mg total) by mouth 2 (two) times daily with a meal. 04/05/18   Eustace MooreNelson, Romulo Okray Sue, MD  traZODone (DESYREL) 50 MG tablet Take 50 mg by mouth at bedtime. 06/30/14   Lorre MunroeBaity, Regina W, NP    Family History Family History  Problem Relation Age of Onset  . Hypertension Mother   . Thyroid disease Mother   . Hypertension Maternal Aunt   . Hypertension Maternal  Uncle   . Heart disease Maternal Grandmother   . Cancer Maternal Grandmother        Breast  . Hypertension Father   . Cardiomyopathy Father     Social History Social History   Tobacco Use  . Smoking status: Never Smoker  . Smokeless tobacco: Never Used  Substance Use Topics  . Alcohol use: Yes    Alcohol/week: 0.0 oz    Comment: occasional  . Drug use: No     Allergies   Amoxicillin and Penicillins   Review of Systems Review of Systems  Constitutional: Negative for chills and fever.  HENT: Negative for ear pain and sore throat.   Eyes: Negative for pain and visual disturbance.  Respiratory: Negative for cough and shortness of breath.   Cardiovascular: Negative for chest pain and palpitations.  Gastrointestinal: Negative for abdominal pain and vomiting.  Genitourinary: Negative for dysuria and hematuria.  Musculoskeletal: Negative for arthralgias and back pain.  Skin: Positive for rash. Negative for color change.  Neurological: Negative for seizures and syncope.  All other systems reviewed and are negative.    Physical Exam Triage Vital Signs ED Triage Vitals [04/05/18 1303]  Enc Vitals Group     BP (!) 140/98     Pulse Rate  84     Resp 16     Temp 98.5 F (36.9 C)     Temp src      SpO2 100 %     Weight      Height      Head Circumference      Peak Flow      Pain Score      Pain Loc      Pain Edu?      Excl. in GC?    No data found.  Updated Vital Signs BP (!) 140/98   Pulse 84   Temp 98.5 F (36.9 C)   Resp 16   SpO2 100%   Visual Acuity Right Eye Distance:   Left Eye Distance:   Bilateral Distance:    Right Eye Near:   Left Eye Near:    Bilateral Near:     Physical Exam  Constitutional: She appears well-developed and well-nourished. No distress.  HENT:  Head: Normocephalic and atraumatic.  Mouth/Throat: Oropharynx is clear and moist.  Eyes: Pupils are equal, round, and reactive to light. Conjunctivae are normal.  Neck: Normal  range of motion.  Cardiovascular: Normal rate, regular rhythm and normal heart sounds.  Pulmonary/Chest: Effort normal and breath sounds normal. No respiratory distress.  Abdominal: Soft. She exhibits no distension.  Musculoskeletal: Normal range of motion. She exhibits no edema.  Neurological: She is alert.  Skin: Skin is warm and dry.        UC Treatments / Results  Labs (all labs ordered are listed, but only abnormal results are displayed) Labs Reviewed - No data to display  EKG None  Radiology No results found.  Procedures Procedures (including critical care time)  Medications Ordered in UC Medications - No data to display  Initial Impression / Assessment and Plan / UC Course  I have reviewed the triage vital signs and the nursing notes.  Pertinent labs & imaging results that were available during my care of the patient were reviewed by me and considered in my medical decision making (see chart for details).     Discussed local allergic reaction.  Discussed this will not likely turn into any more serious reaction, anaphylaxis, or need for EpiPen.  Discussed avoidance of bites.  Discussed immediate treatment with ice and Benadryl. Final Clinical Impressions(s) / UC Diagnoses   Final diagnoses:  Allergy, insect bite     Discharge Instructions     Take an antihistamine for itching You may use Benadryl at night and a nondrowsy antihistamine like Claritin or Zyrtec during the day Take the prednisone twice a day for 5 days.  Take 2 tablets today   ED Prescriptions    Medication Sig Dispense Auth. Provider   predniSONE (DELTASONE) 20 MG tablet Take 1 tablet (20 mg total) by mouth 2 (two) times daily with a meal. 10 tablet Eustace Moore, MD     Controlled Substance Prescriptions [place] Controlled Substance Registry consulted? Not Applicable   Eustace Moore, MD 04/05/18 1324

## 2018-04-14 MED FILL — NORETHINDRONE 0.35 MG TAB: 0.35 | 28 days supply | Qty: 28 | Fill #0

## 2018-04-19 ENCOUNTER — Encounter: Payer: Self-pay | Admitting: *Deleted

## 2018-04-19 ENCOUNTER — Encounter: Payer: Self-pay | Admitting: Interventional Cardiology

## 2018-04-19 NOTE — Progress Notes (Signed)
This encounter was created in error - please disregard.

## 2018-04-26 ENCOUNTER — Telehealth: Payer: Self-pay

## 2018-04-26 NOTE — Telephone Encounter (Signed)
Please let her know that her cardiology workup has been negative. We can send a referral to endocrinology as discussed at her last visit. Please ask if she would like to see endocrinology for elevated BP. She needs an appt to discuss heart rate and BP log. I hesitate to increase her coreg without knowing her heart rate. She may start losartan, but we would need basleine BMP. Please see if she can get in with Dr. Katrinka BlazingSmith ASAP, as she has not seen a physician yet. Please ask what her BP has been running. Thanks Angie

## 2018-04-29 NOTE — Telephone Encounter (Signed)
[id number] 

## 2018-05-05 NOTE — Telephone Encounter (Signed)
Left detailed message.   

## 2018-05-21 MED FILL — NORETHINDRONE 0.35 MG TAB: 0.35 | 84 days supply | Qty: 84 | Fill #1

## 2018-05-21 MED FILL — CARVEDILOL 12.5 MG TABLET: 12.5 | 90 days supply | Qty: 180 | Fill #1

## 2018-06-22 MED FILL — NORETHINDRONE 0.35 MG TAB: 0.35 | 56 days supply | Qty: 56 | Fill #2

## 2018-07-01 ENCOUNTER — Ambulatory Visit: Payer: No Typology Code available for payment source | Admitting: Interventional Cardiology

## 2018-08-12 MED FILL — NORETHINDRONE 0.35 MG TAB: 0.35 | 84 days supply | Qty: 84 | Fill #3

## 2018-09-07 ENCOUNTER — Ambulatory Visit: Payer: No Typology Code available for payment source | Admitting: Interventional Cardiology

## 2018-09-08 ENCOUNTER — Encounter: Payer: Self-pay | Admitting: Interventional Cardiology

## 2018-10-12 ENCOUNTER — Telehealth: Payer: Self-pay

## 2018-10-12 NOTE — Telephone Encounter (Signed)
Pt's mother called to follow up on appt for daughter. Scheduled OV for 10/14/18 at 9am. Okay per Dr. Dayton MartesAron. Pt will check in at 8:40a.

## 2018-10-12 NOTE — Telephone Encounter (Signed)
Copied from CRM [id number]. Topic: Appointment Scheduling - Scheduling Inquiry for Clinic >> Oct 12, 2018 10:21 AM Mickel BaasMcGee, Demi B, NT wrote: Reason for CRM: Patient's mother, Lenox PondsLisa Alviti, calling on behalf of the patient and states that she is very down, mother thinks she is depressed and would like to see if she could be seen this week. Denies thoughts of hurting herself or others. No available 40 min appointment this week. Please advise.  CB#: [phone]

## 2018-10-14 ENCOUNTER — Encounter: Payer: Self-pay | Admitting: Family Medicine

## 2018-10-14 ENCOUNTER — Ambulatory Visit (INDEPENDENT_AMBULATORY_CARE_PROVIDER_SITE_OTHER): Payer: No Typology Code available for payment source | Admitting: Family Medicine

## 2018-10-14 VITALS — BP 132/84 | HR 84 | Temp 98.7°F | Ht 70.0 in | Wt 157.8 lb

## 2018-10-14 DIAGNOSIS — Z30019 Encounter for initial prescription of contraceptives, unspecified: Secondary | ICD-10-CM | POA: Diagnosis not present

## 2018-10-14 DIAGNOSIS — F329 Major depressive disorder, single episode, unspecified: Secondary | ICD-10-CM

## 2018-10-14 DIAGNOSIS — R002 Palpitations: Secondary | ICD-10-CM | POA: Insufficient documentation

## 2018-10-14 DIAGNOSIS — I10 Essential (primary) hypertension: Secondary | ICD-10-CM | POA: Diagnosis not present

## 2018-10-14 DIAGNOSIS — F32A Depression, unspecified: Secondary | ICD-10-CM | POA: Insufficient documentation

## 2018-10-14 DIAGNOSIS — F419 Anxiety disorder, unspecified: Secondary | ICD-10-CM | POA: Diagnosis not present

## 2018-10-14 MED ORDER — NORETHIN-ETH ESTRAD-FE BIPHAS 1 MG-10 MCG / 10 MCG PO TABS: 1.0000 | ORAL_TABLET | Freq: Every day | ORAL | 11 refills | Status: DC

## 2018-10-14 MED ORDER — ESCITALOPRAM OXALATE 20 MG PO TABS: 20.0000 mg | ORAL_TABLET | Freq: Every day | ORAL | 3 refills | Status: DC

## 2018-10-14 MED FILL — ESCITALOPRAM 20 MG TABLET: 20 | 90 days supply | Qty: 90 | Fill #0

## 2018-10-14 NOTE — Assessment & Plan Note (Signed)
Well controlled with coreg. Cardiology/renal work up neg- likely due to anxiety. No changes made.

## 2018-10-14 NOTE — Assessment & Plan Note (Signed)
Deteriorated. Restart lexapro- mom feel she needs a higher dose-eRx sent for lexapro 20 mg daily- okay to break in half for 3-5 day (take 10 mg daily) and increase to full tablet, 20 mg daily thereafter. She will update me. The patient indicates understanding of these issues and agrees with the plan.

## 2018-10-14 NOTE — Progress Notes (Signed)
Subjective:   Patient ID: Sherri Harris, female    DOB: June 21, 1996, 23 y.o.   MRN: [id number]  Sherri Harris is a pleasant 23 y.o. year old female who presents to clinic today with Depression (Patient is here today C/O depression.  States that she cannot sleep but is depressed and anxious all the time and her mind won't shut down.  She makes bad decisions when she is in the throws of depression.  She is not on the Nexplanon anymore but is on Norethindrone and has had 3 periods in the last month.)  on 10/14/2018  HPI:  Patient is here to discuss anxiety and depression.  Was on lexapro 10 mg daily (last took it in 2016). She felt it worked well.  But her mom feels she had mood swings.  Last several months, increased increased depression- anhedonia, not sleeping well, decreased energy. Denies SI or HI.  She does have increased anxiety and mood swings.  Has mood swings.  Depression screen [facility] 2/9 10/14/2018 12/02/2017 09/08/2016 04/02/2016 04/24/2014  Decreased Interest 2 0 0 0 0  Down, Depressed, Hopeless 2 0 0 0 0  PHQ - 2 Score 4 0 0 0 0  Altered sleeping 2 - - - -  Tired, decreased energy 2 - - - -  Change in appetite 1 - - - -  Feeling bad or failure about yourself  2 - - - -  Trouble concentrating 1 - - - -  Moving slowly or fidgety/restless 2 - - - -  Suicidal thoughts 0 - - - -  PHQ-9 Score 14 - - - -  Difficult doing work/chores Very difficult - - - -   GAD 7 : Generalized Anxiety Score 10/14/2018  Nervous, Anxious, on Edge 3  Control/stop worrying 3  Worry too much - different things 3  Trouble relaxing 1  Restless 1  Easily annoyed or irritable 2  Afraid - awful might happen 2  Total GAD 7 Score 15  Anxiety Difficulty Very difficult    HTN/palpitations- followed by cardiology.  Was last seen by Dr. Kateri Mc on 02/24/18- at that time HCTZ and norvasc were d/c'd and shew as started on coreg 12.5 mg twice daily due to palpitations.  Contraception management- was placed on  micronor until her elevated blood pressure was worked up and controlled.  Having 3 periods per month and feels it is worsening her mood.  Asking to restart Lo lo estrin which worked well for her in the past.  Current Outpatient Medications on File Prior to Visit  Medication Sig Dispense Refill  . carvedilol (COREG) 12.5 MG tablet Take 1/2 tablet by mouth twice a day for 1 week then take 1 tablet by mouth twice a day 180 tablet 3   No current facility-administered medications on file prior to visit.     Allergies  Allergen Reactions  . Amoxicillin Rash    REACTION: rash  . Penicillins Rash    Past Medical History:  Diagnosis Date  . Anxiety   . Hypertension     Past Surgical History:  Procedure Laterality Date  . TYMPANOSTOMY TUBE PLACEMENT    . WISDOM TOOTH EXTRACTION      Family History  Problem Relation Age of Onset  . Hypertension Mother   . Thyroid disease Mother   . Hypertension Maternal Aunt   . Hypertension Maternal Uncle   . Heart disease Maternal Grandmother   . Cancer Maternal Grandmother  Breast  . Hypertension Father   . Cardiomyopathy Father     Social History   Socioeconomic History  . Marital status: Single    Spouse name: Not on file  . Number of children: Not on file  . Years of education: Not on file  . Highest education level: Not on file  Occupational History  . Not on file  Social Needs  . Financial resource strain: Not on file  . Food insecurity:    Worry: Not on file    Inability: Not on file  . Transportation needs:    Medical: Not on file    Non-medical: Not on file  Tobacco Use  . Smoking status: Never Smoker  . Smokeless tobacco: Never Used  Substance and Sexual Activity  . Alcohol use: Yes    Alcohol/week: 0.0 standard drinks    Comment: occasional  . Drug use: No  . Sexual activity: Yes    Birth control/protection: Pill  Lifestyle  . Physical activity:    Days per week: Not on file    Minutes per session: Not  on file  . Stress: Not on file  Relationships  . Social connections:    Talks on phone: Not on file    Gets together: Not on file    Attends religious service: Not on file    Active member of club or organization: Not on file    Attends meetings of clubs or organizations: Not on file    Relationship status: Not on file  . Intimate partner violence:    Fear of current or ex partner: Not on file    Emotionally abused: Not on file    Physically abused: Not on file    Forced sexual activity: Not on file  Other Topics Concern  . Not on file  Social History Narrative  . Not on file   The PMH, PSH, Social History, Family History, Medications, and allergies [facility], and have been updated if relevant.   Review of Systems  Respiratory: Negative.   Cardiovascular: Negative.   Psychiatric/Behavioral: Positive for decreased concentration, dysphoric mood and sleep disturbance. Negative for agitation, behavioral problems, confusion, hallucinations, self-injury and suicidal ideas. The patient is nervous/anxious. The patient is not hyperactive.   All other systems reviewed and are negative.      Objective:    BP 132/84 (BP Location: Left Arm, Patient Position: Sitting, Cuff Size: Normal)   Pulse 84   Temp 98.7 F (37.1 C) (Oral)   Ht 5\' 10"  (1.778 m)   Wt 157 lb 12.8 oz (71.6 kg)   LMP 10/14/2018   SpO2 99%   BMI 22.64 kg/m    Physical Exam Vitals signs and nursing note reviewed.  Constitutional:      General: She is not in acute distress.    Appearance: Normal appearance. She is not toxic-appearing.  HENT:     Head: Normocephalic and atraumatic.     Nose: Nose normal.     Mouth/Throat:     Mouth: Mucous membranes are moist.  Eyes:     Extraocular Movements: Extraocular movements intact.  Neck:     Musculoskeletal: Normal range of motion.  Cardiovascular:     Rate and Rhythm: Normal rate and regular rhythm.     Pulses: Normal pulses.     Heart sounds:  Normal heart sounds.  Pulmonary:     Effort: Pulmonary effort is normal.     Breath sounds: Normal breath sounds.  Musculoskeletal: Normal  range of motion.        General: No swelling.  Skin:    General: Skin is warm and dry.  Neurological:     General: No focal deficit present.     Mental Status: She is alert.  Psychiatric:        Mood and Affect: Mood normal.        Behavior: Behavior normal.        Thought Content: Thought content normal.        Judgment: Judgment normal.           Assessment & Plan:   Encounter for initial prescription of contraceptives, unspecified contraceptive  Anxiety and depression - Plan: Ambulatory referral to Psychology  Palpitations No follow-ups on file.

## 2018-10-14 NOTE — Assessment & Plan Note (Signed)
>  25 minutes spent in face to face time with patient, >50% spent in counselling or coordination of care discussing contraception, anxiety/depression and hypertension with palpitations. Reasonable to stop micronor and restart Lo Loestrin.  eRx sent and she will update me in a few months.

## 2018-10-14 NOTE — Patient Instructions (Signed)
Great to see you.  We are starting lexapro 20 mg daily.  You can break it in half and 10 mg daily for a 3-5 days to let it get into your system and then increase to a full tablet - 20 mg daily therafter.  We are referring you to a therapist.  Either call or send me a message or follow up in my office in 1 month.

## 2018-10-15 ENCOUNTER — Telehealth: Payer: Self-pay | Admitting: Family Medicine

## 2018-10-15 NOTE — Telephone Encounter (Signed)
Copied from CRM [id number]. Topic: Quick Communication - See Telephone Encounter >> Oct 15, 2018 10:58 AM Lorrine Kin, NT wrote: CRM for notification. See Telephone encounter for: 10/15/18. Patient's mother, [name], calling and states that the insurance has denied approving the Norethindrone-Ethinyl Estradiol-Fe Biphas (LO LOESTRIN FE) 1 MG-10 MCG / 10 MCG tablet. States that they need proof of patient trying a generic within the last year. Please advise.  CB#: [phone]

## 2018-10-15 NOTE — Telephone Encounter (Signed)
Mom is aware/thx dmf

## 2018-10-15 NOTE — Telephone Encounter (Signed)
PA for BCP initiated via CMM/thx dmf

## 2018-10-18 NOTE — Telephone Encounter (Signed)
Mother is aware of Lo Loestrin FE being approved by [employer] until 1.1.2021/thx dmf

## 2018-10-19 ENCOUNTER — Encounter: Payer: Self-pay | Admitting: Family Medicine

## 2018-10-19 MED FILL — LO LOESTRIN FE 1-10 TABLET: 1 MG-10 MCG | 84 days supply | Qty: 84 | Fill #0

## 2018-10-21 NOTE — Telephone Encounter (Signed)
Unfortunately, due to security of [facility] I am unable to access or see any referral once it has been sent to that location. This is just the way Epic has it sent up for security of the patient.  You would have to go in and cancel the referral.

## 2018-10-28 MED FILL — CARVEDILOL 12.5 MG TABLET: 12.5 | 90 days supply | Qty: 180 | Fill #2

## 2018-12-23 MED FILL — OSELTAMIVIR PHOSPHATE 75 MG: 75 | 5 days supply | Qty: 10 | Fill #0

## 2018-12-23 MED FILL — BENZONATATE 200 MG CAPS: 200 | 5 days supply | Qty: 15 | Fill #0

## 2018-12-31 MED FILL — LO LOESTRIN FE 1-10 TABLET: 1 MG-10 MCG | 84 days supply | Qty: 84 | Fill #1

## 2019-01-13 ENCOUNTER — Emergency Department (INDEPENDENT_AMBULATORY_CARE_PROVIDER_SITE_OTHER)
Admission: EM | Admit: 2019-01-13 | Discharge: 2019-01-13 | Disposition: A | Discharge status: Home / Self Care | Payer: No Typology Code available for payment source | Source: Home / Self Care | Attending: Family Medicine | Admitting: Family Medicine

## 2019-01-13 ENCOUNTER — Other Ambulatory Visit: Payer: Self-pay

## 2019-01-13 DIAGNOSIS — R112 Nausea with vomiting, unspecified: Secondary | ICD-10-CM

## 2019-01-13 DIAGNOSIS — F1010 Alcohol abuse, uncomplicated: Secondary | ICD-10-CM

## 2019-01-13 DIAGNOSIS — K920 Hematemesis: Secondary | ICD-10-CM

## 2019-01-13 DIAGNOSIS — R1013 Epigastric pain: Secondary | ICD-10-CM

## 2019-01-13 LAB — POCT URINALYSIS DIP (MANUAL ENTRY)
Bilirubin, UA: NEGATIVE
Glucose, UA: NEGATIVE mg/dL
Leukocytes, UA: NEGATIVE
Nitrite, UA: NEGATIVE
Protein Ur, POC: 30 mg/dL — AB
Spec Grav, UA: 1.025 (ref 1.010–1.025)
Urobilinogen, UA: 0.2 E.U./dL
pH, UA: 6.5 (ref 5.0–8.0)

## 2019-01-13 LAB — POCT CBC W AUTO DIFF (K'VILLE URGENT CARE)

## 2019-01-13 MED ORDER — SODIUM CHLORIDE 0.9 % IV BOLUS
1000.0000 mL | Freq: Once | INTRAVENOUS | Status: AC
  Administered 2019-01-13: 17:00:00 1000 mL via INTRAVENOUS

## 2019-01-13 MED ORDER — ONDANSETRON 4 MG PO TBDP: ORAL_TABLET | ORAL | 0 refills | Status: DC

## 2019-01-13 MED ORDER — OMEPRAZOLE 20 MG PO CPDR: DELAYED_RELEASE_CAPSULE | ORAL | 1 refills | Status: DC

## 2019-01-13 MED ORDER — ONDANSETRON 4 MG PO TBDP
4.0000 mg | ORAL_TABLET | Freq: Once | ORAL | Status: AC
  Administered 2019-01-13: 4 mg via ORAL

## 2019-01-13 MED FILL — OMEPRAZOLE 20 MG CPDR: 20 | 15 days supply | Qty: 15 | Fill #0

## 2019-01-13 MED FILL — ONDANSETRON ODT 4 MG TABLET: 4 | 3 days supply | Qty: 12 | Fill #0

## 2019-01-13 NOTE — ED Provider Notes (Signed)
[facility]    CSN: [id number] Arrival date & time: 01/13/19  1510     History   Chief Complaint Chief Complaint  Patient presents with  . Emesis    with presence of blood    HPI Sherri Harris is a 23 y.o. female.   Patient reports that she has been binge drinking during the past 4 days; drinking vodka at least 5 times/day.  Today she developed nausea/vomiting, having vomited about 15 times.  She reports that she vomited blood twice today, but denies abdominal pain.  Urination has been normal.  She states that her appetite has been decreased and she has not eaten in about 4 days.  She denies fever or respiratory symptoms.  She admits that she has been stressed, but denies being down or depressed.    The history is provided by the patient.  Emesis  Severity:  Severe Duration:  1 day Number of daily episodes:  15 Quality:  Stomach contents and bright red blood Progression:  Unchanged Chronicity:  New Recent urination:  Normal Relieved by:  Nothing Worsened by:  Liquids and food smell Ineffective treatments:  None tried Associated symptoms: no abdominal pain, no chills, no cough, no diarrhea, no fever, no headaches, no myalgias, no sore throat and no URI   Risk factors: alcohol use     Past Medical History:  Diagnosis Date  . Anxiety   . Hypertension     Patient Active Problem List   Diagnosis Date Noted  . Anxiety and depression 10/14/2018  . Palpitations 10/14/2018  . Anxiety 06/17/2017  . Elevated blood pressure reading 11/10/2016  . 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 HTN (hypertension) 08/22/2016  . Contraception management 07/23/2016    Past Surgical History:  Procedure Laterality Date  . TYMPANOSTOMY TUBE PLACEMENT    . WISDOM TOOTH EXTRACTION      OB History   No obstetric history on file.      Home Medications    Prior to Admission medications   Medication Sig Start Date End Date Taking? Authorizing Provider   carvedilol (COREG) 12.5 MG tablet Take 1/2 tablet by mouth twice a day for 1 week then take 1 tablet by mouth twice a day 02/24/18   Marcelino Duster, PA  escitalopram (LEXAPRO) 20 MG tablet Take 1 tablet (20 mg total) by mouth daily. 10/14/18   Dianne Dun, MD  Norethindrone-Ethinyl Estradiol-Fe Biphas (LO LOESTRIN FE) 1 MG-10 MCG / 10 MCG tablet Take 1 tablet by mouth daily. 10/14/18   Dianne Dun, MD  omeprazole (PRILOSEC) 20 MG capsule Take one tab PO daily about 20 minutes AC 01/13/19   Lattie Haw, MD  ondansetron (ZOFRAN ODT) 4 MG disintegrating tablet Take one tab by mouth Q6hr prn nausea.  Dissolve under tongue. 01/13/19   Lattie Haw, MD    Family History Family History  Problem Relation Age of Onset  . Hypertension Mother   . Thyroid disease Mother   . Hypertension Maternal Aunt   . Hypertension Maternal Uncle   . Heart disease Maternal Grandmother   . Cancer Maternal Grandmother        Breast  . Hypertension Father   . Cardiomyopathy Father     Social History Social History   Tobacco Use  . Smoking status: Never Smoker  . Smokeless tobacco: Never Used  Substance Use Topics  . Alcohol use: Yes    Alcohol/week: 0.0 standard drinks    Comment: occasional  .  Drug use: No     Allergies   Amoxicillin and Penicillins   Review of Systems Review of Systems  Constitutional: Positive for activity change, appetite change and fatigue. Negative for chills, diaphoresis and fever.  HENT: Negative for rhinorrhea, sore throat and trouble swallowing.   Eyes: Negative.   Respiratory: Positive for chest tightness. Negative for cough, shortness of breath and stridor.   Cardiovascular: Negative.   Gastrointestinal: Positive for nausea and vomiting. Negative for abdominal distention, abdominal pain, blood in stool and diarrhea.  Genitourinary: Negative.   Musculoskeletal: Negative for myalgias.  Neurological: Positive for light-headedness. Negative for headaches.   Psychiatric/Behavioral: Negative for suicidal ideas. The patient is nervous/anxious.      Physical Exam Triage Vital Signs ED Triage Vitals [01/13/19 1530]  Enc Vitals Group     BP (!) 128/91     Pulse Rate (!) 102     Resp 18     Temp (!) 97.5 F (36.4 C)     Temp Source Oral     SpO2 97 %     Weight      Height 5\' 10"  (1.778 m)     Head Circumference      Peak Flow      Pain Score 0     Pain Loc      Pain Edu?      Excl. in GC?    Orthostatic VS for the past 24 hrs:  BP- Lying Pulse- Lying BP- Sitting Pulse- Sitting BP- Standing at 0 minutes Pulse- Standing at 0 minutes  01/13/19 1611 127/87 88 (!) 132/96 93 120/88 102    Updated Vital Signs BP (!) 128/91 (BP Location: Right Arm)   Pulse (!) 102   Temp (!) 97.5 F (36.4 C) (Oral)   Resp 18   Ht 5\' 10"  (1.778 m)   LMP 01/07/2019 (Approximate)   SpO2 97%   BMI 22.64 kg/m   Visual Acuity Right Eye Distance:   Left Eye Distance:   Bilateral Distance:    Right Eye Near:   Left Eye Near:    Bilateral Near:     Physical Exam Nursing notes and Vital Signs reviewed. Appearance:  Patient appears stated age, and in no acute distress.  She is alert and oriented.   Eyes:  Pupils are equal, round, and reactive to light and accomodation.  Extraocular movement is intact.  Conjunctivae are not inflamed   Pharynx:  Normal;  mucous membranes have decreased moisture Neck:  Supple.  No adenopathy Lungs:  Clear to auscultation.  Breath sounds are equal.  Moving air well. Heart:  Regular rate and rhythm without murmurs, rubs, or gallops.  Abdomen:  Nontender without masses or hepatosplenomegaly.  Bowel sounds are present.  No CVA or flank tenderness.  Extremities:  No edema.  Skin:  No rash present.     UC Treatments / Results  Labs (all labs ordered are listed, but only abnormal results are displayed) Labs Reviewed  POCT URINALYSIS DIP (MANUAL ENTRY) - Abnormal; Notable for the following components:      Result Value    Ketones, POC UA small (15) (*)    Blood, UA moderate (*)    Protein Ur, POC =30 (*)    All other components within normal limits  POCT CBC W AUTO DIFF ([facility]):  WBC 8.3; LY 34.0; MO 14.5; GR 51.5; Hgb 14.7; Platelets 475     EKG None  Radiology No results found.  Procedures Procedures (including critical  care time)  Medications Ordered in UC Medications  ondansetron (ZOFRAN-ODT) disintegrating tablet 4 mg (4 mg Oral Given 01/13/19 1533)  sodium chloride 0.9 % bolus 1,000 mL (1,000 mLs Intravenous New Bag/Given 01/13/19 1640)    Initial Impression / Assessment and Plan / UC Course  I have reviewed the triage vital signs and the nursing notes.  Pertinent labs & imaging results that were available during my care of the patient were reviewed by me and considered in my medical decision making (see chart for details).    Normal CBC and urinalysis reassuring Administered IV NS one liter with improvement in patient's nausea. Administered Zofran ODT 4mg  PO; given Rx for same. Begin omeprazole 20mg  daily. Avoid alcohol. Followup with Family Doctor if not improved in one week.    Final Clinical Impressions(s) / UC Diagnoses   Final diagnoses:  Non-intractable vomiting with nausea, unspecified vomiting type  Epigastric pain  Hematemesis with nausea  Alcohol abuse     Discharge Instructions     If symptoms become significantly worse during the night or over the weekend, proceed to the local emergency room (call first)    ED Prescriptions    Medication Sig Dispense Auth. Provider   ondansetron (ZOFRAN ODT) 4 MG disintegrating tablet Take one tab by mouth Q6hr prn nausea.  Dissolve under tongue. 12 tablet Lattie Haw, MD   omeprazole (PRILOSEC) 20 MG capsule Take one tab PO daily about 20 minutes AC 15 capsule Lattie Haw, MD         Lattie Haw, MD 01/13/19 [phone]

## 2019-01-13 NOTE — ED Triage Notes (Signed)
Pt c/o vomiting with presence of blood that started today. Says she has been drinking heavily the last 4 days. Low appetite. Vomited about 15 times today. [facility].

## 2019-01-13 NOTE — Discharge Instructions (Addendum)
If symptoms become significantly worse during the night or over the weekend, proceed to the local emergency room (call first)

## 2019-01-15 ENCOUNTER — Telehealth: Payer: Self-pay | Admitting: Emergency Medicine

## 2019-01-15 NOTE — Telephone Encounter (Signed)
Attempted to contact patient, unable to leave a message, voicemail not set up.

## 2019-01-18 ENCOUNTER — Encounter: Payer: Self-pay | Admitting: Family Medicine

## 2019-01-19 MED FILL — CARVEDILOL 12.5 MG TABLET: 12.5 | 90 days supply | Qty: 180 | Fill #0

## 2019-01-19 MED FILL — ESCITALOPRAM 20 MG TABLET: 20 | 90 days supply | Qty: 90 | Fill #0

## 2019-01-31 ENCOUNTER — Encounter: Payer: Self-pay | Admitting: Family Medicine

## 2019-02-01 ENCOUNTER — Ambulatory Visit: Payer: No Typology Code available for payment source | Admitting: Family Medicine

## 2019-02-16 ENCOUNTER — Encounter: Payer: Self-pay | Admitting: Family Medicine

## 2019-02-17 ENCOUNTER — Ambulatory Visit (INDEPENDENT_AMBULATORY_CARE_PROVIDER_SITE_OTHER): Payer: No Typology Code available for payment source | Admitting: Family Medicine

## 2019-02-17 DIAGNOSIS — F10129 Alcohol abuse with intoxication, unspecified: Secondary | ICD-10-CM | POA: Diagnosis not present

## 2019-02-17 DIAGNOSIS — F10229 Alcohol dependence with intoxication, unspecified: Secondary | ICD-10-CM | POA: Insufficient documentation

## 2019-02-17 DIAGNOSIS — F1020 Alcohol dependence, uncomplicated: Secondary | ICD-10-CM | POA: Diagnosis not present

## 2019-02-17 DIAGNOSIS — F10929 Alcohol use, unspecified with intoxication, unspecified: Secondary | ICD-10-CM | POA: Insufficient documentation

## 2019-02-17 NOTE — Progress Notes (Signed)
Virtual Visit via Video   Due to the COVID-19 pandemic, this visit was completed with telemedicine (audio/video) technology to reduce patient and provider exposure as well as to preserve personal protective equipment.   I connected with Cecile SheererHeather E Haan by a video enabled telemedicine application and verified that I am speaking with the correct person using two identifiers. Location patient: Home Location provider: [facility], Office Persons participating in the virtual visit: Elana AlmHeather E Tague, Alfonso Carden, MD   I discussed the limitations of evaluation and management by telemedicine and the availability of in person appointments. The patient expressed understanding and agreed to proceed.  Care Team   Patient Care Team: Dianne DunAron, Braeson Rupe M, MD as PCP - General (Family Medicine) Lyn RecordsSmith, Henry W, MD as PCP - Cardiology (Cardiology)  Subjective:   HPI:   Pt was see on 4.2.20 at [facility] after binge drinking.  Note reviewed. She admitted to them that she has been binge drinking vodka at least 5 times per day for 4 days, vomited 15 times and had vomited blood twice [facility].  Admitted being stressed but not depressed. Their assessment and plan was as follows:  Normal CBC and urinalysis reassuring Administered IV NS one liter with improvement in patient's nausea. Administered Zofran ODT 4mg  PO; given Rx for same. Begin omeprazole 20mg  daily. Avoid alcohol.  She has been Dx with bleeding ulcers. Mom found Vodka in pt's car last night. Pt has had an ongoing issue with this and has failed at numerous attempts to quit ETOH.  This morning, insurance company did [employer] (consulting)approve [facility].  She still denies feeling depressed or suicidal.  [facility] 10/14/2018 12/02/2017 09/08/2016 04/02/2016 04/24/2014  Decreased Interest 2 0 0 0 0  Down, Depressed, Hopeless 2 0 0 0 0  PHQ - 2 Score 4 0 0 0 0  Altered sleeping 2 - - - -  Tired, decreased energy  2 - - - -  Change in appetite 1 - - - -  Feeling bad or failure about yourself  2 - - - -  Trouble concentrating 1 - - - -  Moving slowly or fidgety/restless 2 - - - -  Suicidal thoughts 0 - - - -  PHQ-9 Score 14 - - - -  Difficult doing work/chores Very difficult - - - -   GAD 7 : Generalized Anxiety Score 10/14/2018  Nervous, Anxious, on Edge 3  Control/stop worrying 3  Worry too much - different things 3  Trouble relaxing 1  Restless 1  Easily annoyed or irritable 2  Afraid - awful might happen 2  Total GAD 7 Score 15  Anxiety Difficulty Very difficult     .  Review of Systems  Constitutional: Negative.   Gastrointestinal: Positive for abdominal pain, nausea and vomiting.  Psychiatric/Behavioral: Positive for sleep disturbance. Negative for dysphoric mood, hallucinations, self-injury and suicidal ideas. The patient is nervous/anxious. The patient is not hyperactive.   All other systems reviewed and are negative.    Patient Active Problem List   Diagnosis Date Noted  . Anxiety and depression 10/14/2018  . Palpitations 10/14/2018  . Anxiety 06/17/2017  . Elevated blood pressure reading 11/10/2016  . 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 HTN (hypertension) 08/22/2016  . Contraception management 07/23/2016    Social History   Tobacco Use  . Smoking status: Never Smoker  . Smokeless tobacco: Never Used  Substance Use Topics  . Alcohol use: Yes  Alcohol/week: 0.0 standard drinks    Comment: occasional    Current Outpatient Medications:  .  carvedilol (COREG) 12.5 MG tablet, Take 1/2 tablet by mouth twice a day for 1 week then take 1 tablet by mouth twice a day (Patient taking differently: Take 1 tablet by mouth twice a day), Disp: 180 tablet, Rfl: 3 .  escitalopram (LEXAPRO) 20 MG tablet, Take 1 tablet (20 mg total) by mouth daily., Disp: 90 tablet, Rfl: 3 .  Norethindrone-Ethinyl Estradiol-Fe Biphas (LO LOESTRIN FE) 1 MG-10 MCG / 10 MCG tablet, Take 1  tablet by mouth daily., Disp: 1 Package, Rfl: 11 .  omeprazole (PRILOSEC) 20 MG capsule, Take one tab PO daily about 20 minutes AC, Disp: 15 capsule, Rfl: 1 .  ondansetron (ZOFRAN ODT) 4 MG disintegrating tablet, Take one tab by mouth Q6hr prn nausea.  Dissolve under tongue., Disp: 12 tablet, Rfl: 0  Allergies  Allergen Reactions  . Amoxicillin Rash    REACTION: rash  . Penicillins Rash    Objective:  Wt 153 lb (69.4 kg)   BMI 21.95 kg/m   VITALS: Per patient if applicable, see vitals. GENERAL: Alert, appears well and in no acute distress. HEENT: Atraumatic, conjunctiva clear, no obvious abnormalities on inspection of external nose and ears. NECK: Normal movements of the head and neck. CARDIOPULMONARY: No increased WOB. Speaking in clear sentences. I:E ratio WNL.  MS: Moves all visible extremities without noticeable abnormality. PSYCH: Pleasant and cooperative, well-groomed. Speech normal rate and rhythm. Affect is appropriate. Insight and judgement are appropriate. Attention is focused, linear, and appropriate.  NEURO: CN grossly intact. Oriented as arrived to appointment on time with no prompting. Moves both UE equally.  SKIN: No obvious lesions, wounds, erythema, or cyanosis noted on face or hands.  Depression screen [facility] 2/9 10/14/2018 12/02/2017 09/08/2016  Decreased Interest 2 0 0  Down, Depressed, Hopeless 2 0 0  PHQ - 2 Score 4 0 0  Altered sleeping 2 - -  Tired, decreased energy 2 - -  Change in appetite 1 - -  Feeling bad or failure about yourself  2 - -  Trouble concentrating 1 - -  Moving slowly or fidgety/restless 2 - -  Suicidal thoughts 0 - -  PHQ-9 Score 14 - -  Difficult doing work/chores Very difficult - -    Assessment and Plan:   There are no diagnoses linked to this encounter.  Marland Kitchen COVID-19 Education: The signs and symptoms of COVID-19 were discussed with the patient and how to seek care for testing if needed. The importance of social distancing was  discussed today. . Reviewed expectations re: course of current medical issues. . Discussed self-management of symptoms. . Outlined signs and symptoms indicating need for more acute intervention. . Patient verbalized understanding and all questions were answered. Marland Kitchen Health Maintenance issues including appropriate healthy diet, exercise, and smoking avoidance were discussed with patient. . See orders for this visit as documented in the electronic medical record.  Ruthe Mannan, MD  Records requested if needed. Time spent: 25 minutes, of which >50% was spent in obtaining information about her symptoms, reviewing her previous labs, evaluations, and treatments, counseling her about her condition (please see the discussed topics above), and developing a plan to further investigate it; she had a number of questions which I addressed.

## 2019-02-17 NOTE — Assessment & Plan Note (Signed)
As I was talking to her mother, Insurance approved her to go to [facility] [employer].  Sherri Harris is willing to go.  She knows she needs help. They will keep me updated. The patient indicates understanding of these issues and agrees with the plan.

## 2019-02-18 DIAGNOSIS — R1013 Epigastric pain: Secondary | ICD-10-CM | POA: Diagnosis not present

## 2019-02-18 DIAGNOSIS — F3289 Other specified depressive episodes: Secondary | ICD-10-CM | POA: Diagnosis not present

## 2019-02-18 DIAGNOSIS — I10 Essential (primary) hypertension: Secondary | ICD-10-CM | POA: Diagnosis not present

## 2019-02-18 DIAGNOSIS — F1020 Alcohol dependence, uncomplicated: Secondary | ICD-10-CM | POA: Diagnosis not present

## 2019-02-18 DIAGNOSIS — K921 Melena: Secondary | ICD-10-CM | POA: Diagnosis not present

## 2019-02-18 DIAGNOSIS — F419 Anxiety disorder, unspecified: Secondary | ICD-10-CM | POA: Diagnosis not present

## 2019-02-19 DIAGNOSIS — K921 Melena: Secondary | ICD-10-CM | POA: Diagnosis not present

## 2019-02-19 DIAGNOSIS — R1013 Epigastric pain: Secondary | ICD-10-CM | POA: Diagnosis not present

## 2019-02-19 DIAGNOSIS — I10 Essential (primary) hypertension: Secondary | ICD-10-CM | POA: Diagnosis not present

## 2019-02-19 DIAGNOSIS — F419 Anxiety disorder, unspecified: Secondary | ICD-10-CM | POA: Diagnosis not present

## 2019-02-19 DIAGNOSIS — F3289 Other specified depressive episodes: Secondary | ICD-10-CM | POA: Diagnosis not present

## 2019-02-19 DIAGNOSIS — F1020 Alcohol dependence, uncomplicated: Secondary | ICD-10-CM | POA: Diagnosis not present

## 2019-02-20 DIAGNOSIS — F1020 Alcohol dependence, uncomplicated: Secondary | ICD-10-CM | POA: Diagnosis not present

## 2019-02-21 DIAGNOSIS — F1020 Alcohol dependence, uncomplicated: Secondary | ICD-10-CM | POA: Diagnosis not present

## 2019-02-22 DIAGNOSIS — F1020 Alcohol dependence, uncomplicated: Secondary | ICD-10-CM | POA: Diagnosis not present

## 2019-02-23 DIAGNOSIS — F1020 Alcohol dependence, uncomplicated: Secondary | ICD-10-CM | POA: Diagnosis not present

## 2019-02-23 DIAGNOSIS — F3289 Other specified depressive episodes: Secondary | ICD-10-CM | POA: Diagnosis not present

## 2019-02-23 DIAGNOSIS — K921 Melena: Secondary | ICD-10-CM | POA: Diagnosis not present

## 2019-02-23 DIAGNOSIS — I10 Essential (primary) hypertension: Secondary | ICD-10-CM | POA: Diagnosis not present

## 2019-02-23 DIAGNOSIS — F419 Anxiety disorder, unspecified: Secondary | ICD-10-CM | POA: Diagnosis not present

## 2019-02-23 DIAGNOSIS — R1013 Epigastric pain: Secondary | ICD-10-CM | POA: Diagnosis not present

## 2019-02-24 DIAGNOSIS — F1020 Alcohol dependence, uncomplicated: Secondary | ICD-10-CM | POA: Diagnosis not present

## 2019-02-25 DIAGNOSIS — F1020 Alcohol dependence, uncomplicated: Secondary | ICD-10-CM | POA: Diagnosis not present

## 2019-02-26 DIAGNOSIS — F1020 Alcohol dependence, uncomplicated: Secondary | ICD-10-CM | POA: Diagnosis not present

## 2019-02-27 DIAGNOSIS — F1020 Alcohol dependence, uncomplicated: Secondary | ICD-10-CM | POA: Diagnosis not present

## 2019-02-28 DIAGNOSIS — F1020 Alcohol dependence, uncomplicated: Secondary | ICD-10-CM | POA: Diagnosis not present

## 2019-03-01 DIAGNOSIS — F1020 Alcohol dependence, uncomplicated: Secondary | ICD-10-CM | POA: Diagnosis not present

## 2019-03-01 DIAGNOSIS — F419 Anxiety disorder, unspecified: Secondary | ICD-10-CM | POA: Diagnosis not present

## 2019-03-01 DIAGNOSIS — I10 Essential (primary) hypertension: Secondary | ICD-10-CM | POA: Diagnosis not present

## 2019-03-01 DIAGNOSIS — R1013 Epigastric pain: Secondary | ICD-10-CM | POA: Diagnosis not present

## 2019-03-01 DIAGNOSIS — K921 Melena: Secondary | ICD-10-CM | POA: Diagnosis not present

## 2019-03-01 DIAGNOSIS — F3289 Other specified depressive episodes: Secondary | ICD-10-CM | POA: Diagnosis not present

## 2019-03-02 DIAGNOSIS — F1020 Alcohol dependence, uncomplicated: Secondary | ICD-10-CM | POA: Diagnosis not present

## 2019-03-03 DIAGNOSIS — F1020 Alcohol dependence, uncomplicated: Secondary | ICD-10-CM | POA: Diagnosis not present

## 2019-03-04 DIAGNOSIS — F1020 Alcohol dependence, uncomplicated: Secondary | ICD-10-CM | POA: Diagnosis not present

## 2019-03-05 DIAGNOSIS — F1020 Alcohol dependence, uncomplicated: Secondary | ICD-10-CM | POA: Diagnosis not present

## 2019-03-06 DIAGNOSIS — F1020 Alcohol dependence, uncomplicated: Secondary | ICD-10-CM | POA: Diagnosis not present

## 2019-03-07 DIAGNOSIS — F1020 Alcohol dependence, uncomplicated: Secondary | ICD-10-CM | POA: Diagnosis not present

## 2019-03-08 DIAGNOSIS — F1020 Alcohol dependence, uncomplicated: Secondary | ICD-10-CM | POA: Diagnosis not present

## 2019-03-09 DIAGNOSIS — F1020 Alcohol dependence, uncomplicated: Secondary | ICD-10-CM | POA: Diagnosis not present

## 2019-03-10 DIAGNOSIS — F1020 Alcohol dependence, uncomplicated: Secondary | ICD-10-CM | POA: Diagnosis not present

## 2019-03-11 DIAGNOSIS — F3289 Other specified depressive episodes: Secondary | ICD-10-CM | POA: Diagnosis not present

## 2019-03-11 DIAGNOSIS — I10 Essential (primary) hypertension: Secondary | ICD-10-CM | POA: Diagnosis not present

## 2019-03-11 DIAGNOSIS — K921 Melena: Secondary | ICD-10-CM | POA: Diagnosis not present

## 2019-03-11 DIAGNOSIS — R1013 Epigastric pain: Secondary | ICD-10-CM | POA: Diagnosis not present

## 2019-03-11 DIAGNOSIS — F1020 Alcohol dependence, uncomplicated: Secondary | ICD-10-CM | POA: Diagnosis not present

## 2019-03-11 DIAGNOSIS — F419 Anxiety disorder, unspecified: Secondary | ICD-10-CM | POA: Diagnosis not present

## 2019-03-12 DIAGNOSIS — F1020 Alcohol dependence, uncomplicated: Secondary | ICD-10-CM | POA: Diagnosis not present

## 2019-03-13 DIAGNOSIS — F1020 Alcohol dependence, uncomplicated: Secondary | ICD-10-CM | POA: Diagnosis not present

## 2019-03-17 MED FILL — NALTREXONE 50 MG TABLET: 50 | 30 days supply | Qty: 30 | Fill #0

## 2019-03-17 MED FILL — QUETIAPINE FUMARATE 50 MG T: 50 | 30 days supply | Qty: 30 | Fill #0

## 2019-04-15 ENCOUNTER — Other Ambulatory Visit: Payer: Self-pay | Admitting: Physician Assistant

## 2019-04-15 MED FILL — ESCITALOPRAM 20 MG TABLET: 20 | 90 days supply | Qty: 90 | Fill #1

## 2019-04-18 MED FILL — CARVEDILOL 12.5 MG TABLET: 12.5 | 90 days supply | Qty: 180 | Fill #0

## 2019-05-05 MED FILL — LO LOESTRIN FE 1-10 TABLET: 1 MG-10 MCG | 84 days supply | Qty: 84 | Fill #0

## 2019-07-05 ENCOUNTER — Ambulatory Visit (INDEPENDENT_AMBULATORY_CARE_PROVIDER_SITE_OTHER): Payer: No Typology Code available for payment source | Admitting: Family Medicine

## 2019-07-05 ENCOUNTER — Ambulatory Visit: Payer: No Typology Code available for payment source | Admitting: Family Medicine

## 2019-07-05 ENCOUNTER — Encounter: Payer: Self-pay | Admitting: Family Medicine

## 2019-07-05 ENCOUNTER — Other Ambulatory Visit: Payer: Self-pay

## 2019-07-05 VITALS — BP 116/74 | HR 104 | Temp 98.0°F | Wt 161.2 lb

## 2019-07-05 DIAGNOSIS — F10929 Alcohol use, unspecified with intoxication, unspecified: Secondary | ICD-10-CM

## 2019-07-05 DIAGNOSIS — Z23 Encounter for immunization: Secondary | ICD-10-CM

## 2019-07-05 DIAGNOSIS — F329 Major depressive disorder, single episode, unspecified: Secondary | ICD-10-CM | POA: Diagnosis not present

## 2019-07-05 DIAGNOSIS — F419 Anxiety disorder, unspecified: Secondary | ICD-10-CM

## 2019-07-05 DIAGNOSIS — F32A Depression, unspecified: Secondary | ICD-10-CM

## 2019-07-05 MED ORDER — ESCITALOPRAM OXALATE 20 MG PO TABS: 20.0000 mg | ORAL_TABLET | Freq: Every day | ORAL | 3 refills | Status: DC

## 2019-07-05 MED ORDER — QUETIAPINE FUMARATE 50 MG PO TABS: 50.0000 mg | ORAL_TABLET | Freq: Every day | ORAL | 3 refills | Status: DC

## 2019-07-05 MED ORDER — NALTREXONE HCL 50 MG PO TABS: 50.0000 mg | ORAL_TABLET | Freq: Every day | ORAL | 3 refills | Status: DC

## 2019-07-05 MED FILL — QUETIAPINE FUMARATE 50 MG T: 50 | 90 days supply | Qty: 90 | Fill #0

## 2019-07-05 MED FILL — NALTREXONE 50 MG TABLET: 50 | 90 days supply | Qty: 90 | Fill #0

## 2019-07-05 MED FILL — ESCITALOPRAM 20 MG TABLET: 20 | 90 days supply | Qty: 90 | Fill #0

## 2019-07-05 NOTE — Assessment & Plan Note (Signed)
Despite PHQ and GAD7 screening vastly improved she still does have some anxiety- lexapro refilled and she agreed to see Dr. Gwenlyn Saran for CBT. See below.  GAD 7 : Generalized Anxiety Score 07/05/2019 10/14/2018  Nervous, Anxious, on Edge 2 3  Control/stop worrying 1 3  Worry too much - different things 1 3  Trouble relaxing 0 1  Restless 0 1  Easily annoyed or irritable 1 2  Afraid - awful might happen 0 2  Total GAD 7 Score 5 15  Anxiety Difficulty Somewhat difficult Very difficult    PHQ9 SCORE ONLY 07/05/2019 10/14/2018 12/02/2017  Score 3 14 0

## 2019-07-05 NOTE — Progress Notes (Signed)
Subjective:   Patient ID: Sherri Harris, female    DOB: [birth year], 23 y.o.   MRN: [id number]  Sherri Harris is a pleasant 23 y.o. year old female who presents to clinic today with Follow-up (Pt is here today for a medication follow-up.  She would also like to get a flu shot. )  on 07/05/2019  HPI:  Last saw patient on 02/17/19- note reviewed.  Pt was see on 4.2.20 at [facility] after binge drinking.  Note reviewed. She admitted to them that she has been binge drinking vodka at least 5 times per day for 4 days, vomited 15 times and had vomited blood twice at time of presentation to [employer]  Admitted being stressed but not depressed.  [facility] was [facility] and she was placed on the following medications-  Lexapro 20 mg daily, Naltrexone 50 mg daily and Seroquel 50 mg daily.  She has been off the rxs and then drank alcohol last weekend and knew she needed to restart rxs.  She is going to restart AA.  Anxiety/depression - depression is much better but she's still anxious although screening for both have improved.  Depression screen [facility] 2/9 07/05/2019 10/14/2018 12/02/2017 09/08/2016 04/02/2016  Decreased Interest 0 2 0 0 0  Down, Depressed, Hopeless 0 2 0 0 0  PHQ - 2 Score 0 4 0 0 0  Altered sleeping 1 2 - - -  Tired, decreased energy 1 2 - - -  Change in appetite 0 1 - - -  Feeling bad or failure about yourself  1 2 - - -  Trouble concentrating 0 1 - - -  Moving slowly or fidgety/restless 0 2 - - -  Suicidal thoughts 0 0 - - -  PHQ-9 Score 3 14 - - -  Difficult doing work/chores Not difficult at all Very difficult - - -   GAD 7 : Generalized Anxiety Score 07/05/2019 10/14/2018  Nervous, Anxious, on Edge 2 3  Control/stop worrying 1 3  Worry too much - different things 1 3  Trouble relaxing 0 1  Restless 0 1  Easily annoyed or irritable 1 2  Afraid - awful might happen 0 2  Total GAD 7 Score 5 15  Anxiety Difficulty Somewhat difficult Very  difficult      Current Outpatient Medications on File Prior to Visit  Medication Sig Dispense Refill  . carvedilol (COREG) 12.5 MG tablet Take 1/2 tablet by mouth twice a day for 1 week then take 1 tablet by mouth twice a day. Make annual appt for future refills.Thank you 180 tablet 0  . escitalopram (LEXAPRO) 20 MG tablet Take 1 tablet (20 mg total) by mouth daily. 90 tablet 3  . Norethindrone-Ethinyl Estradiol-Fe Biphas (LO LOESTRIN FE) 1 MG-10 MCG / 10 MCG tablet Take 1 tablet by mouth daily. 1 Package 11   No current facility-administered medications on file prior to visit.     Allergies  Allergen Reactions  . Amoxicillin Rash    REACTION: rash  . Penicillins Rash    Past Medical History:  Diagnosis Date  . Anxiety   . Hypertension     Past Surgical History:  Procedure Laterality Date  . TYMPANOSTOMY TUBE PLACEMENT    . WISDOM TOOTH EXTRACTION      Family History  Problem Relation Age of Onset  . Hypertension Mother   . Thyroid disease Mother   . Hypertension Maternal Aunt   . Hypertension Maternal  Uncle   . Heart disease Maternal Grandmother   . Cancer Maternal Grandmother        Breast  . Hypertension Father   . Cardiomyopathy Father     Social History   Socioeconomic History  . Marital status: Single    Spouse name: Not on file  . Number of children: Not on file  . Years of education: Not on file  . Highest education level: Not on file  Occupational History  . Not on file  Social Needs  . Financial resource strain: Not on file  . Food insecurity    Worry: Not on file    Inability: Not on file  . Transportation needs    Medical: Not on file    Non-medical: Not on file  Tobacco Use  . Smoking status: Never Smoker  . Smokeless tobacco: Never Used  Substance and Sexual Activity  . Alcohol use: Yes    Alcohol/week: 0.0 standard drinks    Comment: occasional  . Drug use: No  . Sexual activity: Yes    Birth control/protection: Pill  Lifestyle   . Physical activity    Days per week: Not on file    Minutes per session: Not on file  . Stress: Not on file  Relationships  . Social Musician on phone: Not on file    Gets together: Not on file    Attends religious service: Not on file    Active member of club or organization: Not on file    Attends meetings of clubs or organizations: Not on file    Relationship status: Not on file  . Intimate partner violence    Fear of current or ex partner: Not on file    Emotionally abused: Not on file    Physically abused: Not on file    Forced sexual activity: Not on file  Other Topics Concern  . Not on file  Social History Narrative  . Not on file   The PMH, PSH, Social History, Family History, Medications, and allergies have been reviewed in [facility], and have been updated if relevant.  Review of Systems  Constitutional: Negative.  Negative for chills.  HENT: Negative.   Eyes: Negative.   Respiratory: Negative.   Cardiovascular: Negative.   Gastrointestinal: Negative.   Musculoskeletal: Negative.   Psychiatric/Behavioral: Negative for agitation, behavioral problems, confusion, decreased concentration, dysphoric mood, hallucinations, self-injury, sleep disturbance and suicidal ideas. The patient is nervous/anxious. The patient is not hyperactive.        Objective:    BP 116/74 (BP Location: Left Arm, Patient Position: Sitting, Cuff Size: Normal)   Pulse (!) 104   Temp 98 F (36.7 C) (Oral)   Wt 161 lb 3.2 oz (73.1 kg)   SpO2 98%   BMI 23.13 kg/m    Physical Exam   General:  Well-developed,well-nourished,in no acute distress; alert,appropriate and cooperative throughout examination Head:  normocephalic and atraumatic.   Eyes:  vision grossly intact, PERRL Ears:  R ear normal and L ear normal externally, TMs clear bilaterally Nose:  no external deformity.   Mouth:  good dentition.   Neck:  No deformities, masses, or tenderness noted.   Lungs:  Normal respiratory  effort, chest expands symmetrically. Lungs are clear to auscultation, no crackles or wheezes. Heart:  Normal rate and regular rhythm. S1 and S2 normal without gallop, murmur, click, rub or other extra sounds. Msk:  No deformity or scoliosis noted of thoracic or lumbar spine.  Extremities:  No clubbing, cyanosis, edema, or deformity noted with normal full range of motion of all joints.   Neurologic:  alert & oriented X3 and gait normal.   Skin:  Intact without suspicious lesions or rashes Psych:  Cognition and judgment appear intact. Alert and cooperative with normal attention span and concentration. No apparent delusions, illusions, hallucinations       Assessment & Plan:   Alcohol abuse with intoxication, unspecified (HCC)  Anxiety and depression  Need for influenza vaccination - Plan: Flu Vaccine QUAD 6+ mos PF IM (Fluarix Quad PF) No follow-ups on file.

## 2019-07-05 NOTE — Patient Instructions (Signed)
Please schedule a Physical with PAP :)

## 2019-07-05 NOTE — Patient Instructions (Signed)
Great to see you.  Please call [facility] at [phone]- please ask to see Dr. Zella Ball (tell her I sent you).  I am incredibly proud of you.  Say hi to your mom for me!!

## 2019-07-05 NOTE — Assessment & Plan Note (Signed)
>  40  minutes spent in face to face time with patient, >50% spent in counselling or coordination of care discussing alcohol abuse, anxiety and depression.  She "fell off the wagon" and realizes she needs to restart her medications- eRx refills sent for her Seroquel and naltrexone.  Advised her to restart AA. Also, she does agree to psychotherapy- referred her to Dr. Gwenlyn Saran- see AVS for details.

## 2019-07-17 DIAGNOSIS — Z01419 Encounter for gynecological examination (general) (routine) without abnormal findings: Secondary | ICD-10-CM | POA: Insufficient documentation

## 2019-07-17 NOTE — Progress Notes (Signed)
Subjective:   Patient ID: Sherri Harris, female    DOB: 28-Sep-1996, 23 y.o.   MRN: [id number]  Sherri Harris is a pleasant 23 y.o. year old female who presents to clinic today with Annual Exam (Pt is here today for a CPE with PAP. Pt is currently fasting. She needs a Quantiferon Gold, drug screen and a Tdap for [facility].) and Follow-up (depression, anxiety, binge drinking)  on 07/18/2019  HPI:  Health Maintenance  Topic Date Due  . PAP-Cervical Cytology Screening  09/19/2015  . TETANUS/TDAP  03/04/2017  . PAP SMEAR-Modifier  10/13/2017  . INFLUENZA VACCINE  Completed  . HIV Screening  Addressed   She is due for pap smear today. Taking Lo Loestrin for contraception.   Last saw pt on 07/05/19 for binge drinking and depression- [facility].  She was placed on Lexapro 20 mg daily, Naltrexone 50 mg daily and Seroquel 50 mg daily.  I did refill her lexapro, seroquel and Naltrexone and referred her for CBT with Dr. Pascal Lux.  She has been off the rxs and then drank alcohol the weekend prior to 9/22 and knew she needed to restart rxs.    At that time, she did state that she would also restart going to [employer]  Depression screen [facility] 2/9 07/05/2019 10/14/2018 12/02/2017 09/08/2016 04/02/2016  Decreased Interest 0 2 0 0 0  Down, Depressed, Hopeless 0 2 0 0 0  PHQ - 2 Score 0 4 0 0 0  Altered sleeping 1 2 - - -  Tired, decreased energy 1 2 - - -  Change in appetite 0 1 - - -  Feeling bad or failure about yourself  1 2 - - -  Trouble concentrating 0 1 - - -  Moving slowly or fidgety/restless 0 2 - - -  Suicidal thoughts 0 0 - - -  PHQ-9 Score 3 14 - - -  Difficult doing work/chores Not difficult at all Very difficult - - -   GAD 7 : Generalized Anxiety Score 07/05/2019 10/14/2018  Nervous, Anxious, on Edge 2 3  Control/stop worrying 1 3  Worry too much - different things 1 3  Trouble relaxing 0 1  Restless 0 1  Easily annoyed or irritable 1 2   Afraid - awful might happen 0 2  Total GAD 7 Score 5 15  Anxiety Difficulty Somewhat difficult Very difficult    Lab Results  Component Value Date   CHOL 188 09/08/2016   HDL 76.00 09/08/2016   LDLCALC 76 09/08/2016   TRIG 179.0 (H) 09/08/2016   CHOLHDL 2 09/08/2016   Lab Results  Component Value Date   ALT 11 11/10/2016   AST 16 11/10/2016   ALKPHOS 65 11/10/2016   BILITOT 0.5 11/10/2016   Lab Results  Component Value Date   NA 138 01/22/2018   K 3.9 01/22/2018   CL 97 01/22/2018   CO2 24 01/22/2018   Lab Results  Component Value Date   CREATININE 0.91 01/22/2018   Lab Results  Component Value Date   WBC 6.6 01/22/2018   HGB 14.5 01/22/2018   HCT 42.2 01/22/2018   MCV 90 01/22/2018   PLT 337 01/22/2018   Lab Results  Component Value Date   TSH 1.260 01/22/2018    Current Outpatient Medications on File Prior to Visit  Medication Sig Dispense Refill  . carvedilol (COREG) 12.5 MG tablet Take 1/2 tablet by mouth twice a day for  1 week then take 1 tablet by mouth twice a day. Make annual appt for future refills.Thank you 180 tablet 0  . escitalopram (LEXAPRO) 20 MG tablet Take 1 tablet (20 mg total) by mouth daily. 90 tablet 3  . naltrexone (DEPADE) 50 MG tablet Take 1 tablet (50 mg total) by mouth daily. 90 tablet 3  . QUEtiapine (SEROQUEL) 50 MG tablet Take 1 tablet (50 mg total) by mouth daily. 90 tablet 3   No current facility-administered medications on file prior to visit.     Allergies  Allergen Reactions  . Amoxicillin Rash    REACTION: rash  . Penicillins Rash    Past Medical History:  Diagnosis Date  . Anxiety   . Hypertension     Past Surgical History:  Procedure Laterality Date  . TYMPANOSTOMY TUBE PLACEMENT    . WISDOM TOOTH EXTRACTION      Family History  Problem Relation Age of Onset  . Hypertension Mother   . Thyroid disease Mother   . Hypertension Maternal Aunt   . Hypertension Maternal Uncle   . Heart disease Maternal  Grandmother   . Cancer Maternal Grandmother        Breast  . Hypertension Father   . Cardiomyopathy Father     Social History   Socioeconomic History  . Marital status: Single    Spouse name: Not on file  . Number of children: Not on file  . Years of education: Not on file  . Highest education level: Not on file  Occupational History  . Not on file  Social Needs  . Financial resource strain: Not on file  . Food insecurity    Worry: Not on file    Inability: Not on file  . Transportation needs    Medical: Not on file    Non-medical: Not on file  Tobacco Use  . Smoking status: Never Smoker  . Smokeless tobacco: Never Used  Substance and Sexual Activity  . Alcohol use: Yes    Alcohol/week: 0.0 standard drinks    Comment: occasional  . Drug use: No  . Sexual activity: Yes    Birth control/protection: Pill  Lifestyle  . Physical activity    Days per week: Not on file    Minutes per session: Not on file  . Stress: Not on file  Relationships  . Social Herbalist on phone: Not on file    Gets together: Not on file    Attends religious service: Not on file    Active member of club or organization: Not on file    Attends meetings of clubs or organizations: Not on file    Relationship status: Not on file  . Intimate partner violence    Fear of current or ex partner: Not on file    Emotionally abused: Not on file    Physically abused: Not on file    Forced sexual activity: Not on file  Other Topics Concern  . Not on file  Social History Narrative  . Not on file   The PMH, PSH, Social History, Family History, Medications, and allergies have been reviewed in [facility], and have been updated if relevant.    Review of Systems  Constitutional: Negative.   HENT: Negative.   Eyes: Negative.   Respiratory: Negative.   Cardiovascular: Negative.   Gastrointestinal: Negative.   Endocrine: Negative.   Genitourinary: Negative.   Musculoskeletal: Negative.   Skin:  Negative.   Allergic/Immunologic: Negative.   Neurological:  Negative.   Hematological: Negative.   Psychiatric/Behavioral: Negative.   All other systems reviewed and are negative.      Objective:    BP 110/70 (BP Location: Left Arm, Patient Position: Sitting, Cuff Size: Normal)   Pulse 92   Temp 98.9 F (37.2 C) (Oral)   Ht  (1.778 m)   Wt 158 lb 3.2 oz (71.8 kg)   SpO2 98%   BMI 22.70 kg/m    Physical Exam   General:  Well-developed,well-nourished,in no acute distress; alert,appropriate and cooperative throughout examination Head:  normocephalic and atraumatic.   Eyes:  vision grossly intact, PERRL Ears:  R ear normal and L ear normal externally, TMs clear bilaterally Nose:  no external deformity.   Mouth:  good dentition.   Neck:  No deformities, masses, or tenderness noted. Breasts:  No mass, nodules, thickening, tenderness, bulging, retraction, inflamation, nipple discharge or skin changes noted.   Lungs:  Normal respiratory effort, chest expands symmetrically. Lungs are clear to auscultation, no crackles or wheezes. Heart:  Normal rate and regular rhythm. S1 and S2 normal without gallop, murmur, click, rub or other extra sounds. Abdomen:  Bowel sounds positive,abdomen soft and non-tender without masses, organomegaly or hernias noted. Rectal:  no external abnormalities.   Genitalia:  Pelvic Exam:        External: normal female genitalia without lesions or masses        Vagina: normal without lesions or masses        Cervix: normal without lesions or masses        Adnexa: normal bimanual exam without masses or fullness        Uterus: normal by palpation        Pap smear: performed Msk:  No deformity or scoliosis noted of thoracic or lumbar spine.   Extremities:  No clubbing, cyanosis, edema, or deformity noted with normal full range of motion of all joints.   Neurologic:  alert & oriented X3 and gait normal.   Skin:  Intact without suspicious lesions or rashes  Cervical Nodes:  No lymphadenopathy noted Axillary Nodes:  No palpable lymphadenopathy Psych:  Cognition and judgment appear intact. Alert and cooperative with normal attention span and concentration. No apparent delusions, illusions, hallucinations       Assessment & Plan:   Encounter for well woman exam with routine gynecological exam - Plan: Cytology - PAP( [place])  Palpitations  Anxiety and depression - Plan: Pain Mgmt, Profile 8 w/Conf, U  Acute alcohol abuse, with unspecified complication (HCC) - Plan: Pain Mgmt, Profile 8 w/Conf, U  Hypertension, unspecified type - Plan: CBC with Differential/Platelet, Comprehensive metabolic panel, Lipid panel, TSH  Encounter for surveillance of contraceptive pills  Need for Tdap vaccination - Plan: Tdap vaccine greater than or equal to 7yo IM  Encounter for screening for respiratory tuberculosis - Plan: QuantiFERON-TB Gold Plus No follow-ups on file.

## 2019-07-18 ENCOUNTER — Other Ambulatory Visit (HOSPITAL_COMMUNITY)
Admission: RE | Admit: 2019-07-18 | Discharge: 2019-07-18 | Disposition: A | Discharge status: Home / Self Care | Payer: No Typology Code available for payment source | Source: Ambulatory Visit | Attending: Family Medicine | Admitting: Family Medicine

## 2019-07-18 ENCOUNTER — Encounter: Payer: Self-pay | Admitting: Family Medicine

## 2019-07-18 ENCOUNTER — Other Ambulatory Visit: Payer: Self-pay

## 2019-07-18 ENCOUNTER — Ambulatory Visit (INDEPENDENT_AMBULATORY_CARE_PROVIDER_SITE_OTHER): Payer: No Typology Code available for payment source | Admitting: Family Medicine

## 2019-07-18 VITALS — BP 110/70 | HR 92 | Temp 98.9°F | Ht 70.0 in | Wt 158.2 lb

## 2019-07-18 DIAGNOSIS — Z23 Encounter for immunization: Secondary | ICD-10-CM | POA: Diagnosis not present

## 2019-07-18 DIAGNOSIS — F419 Anxiety disorder, unspecified: Secondary | ICD-10-CM

## 2019-07-18 DIAGNOSIS — Z3041 Encounter for surveillance of contraceptive pills: Secondary | ICD-10-CM

## 2019-07-18 DIAGNOSIS — Z01419 Encounter for gynecological examination (general) (routine) without abnormal findings: Secondary | ICD-10-CM | POA: Diagnosis present

## 2019-07-18 DIAGNOSIS — Z111 Encounter for screening for respiratory tuberculosis: Secondary | ICD-10-CM

## 2019-07-18 DIAGNOSIS — F10929 Alcohol use, unspecified with intoxication, unspecified: Secondary | ICD-10-CM

## 2019-07-18 DIAGNOSIS — F329 Major depressive disorder, single episode, unspecified: Secondary | ICD-10-CM

## 2019-07-18 DIAGNOSIS — I10 Essential (primary) hypertension: Secondary | ICD-10-CM | POA: Diagnosis not present

## 2019-07-18 DIAGNOSIS — R002 Palpitations: Secondary | ICD-10-CM

## 2019-07-18 MED ORDER — LO LOESTRIN FE 1 MG-10 MCG / 10 MCG PO TABS: 1.0000 | ORAL_TABLET | Freq: Every day | ORAL | 3 refills | Status: DC

## 2019-07-18 MED FILL — LO LOESTRIN FE 1-10 TABLET: 1 MG-10 MCG | 84 days supply | Qty: 84 | Fill #0

## 2019-07-18 NOTE — Assessment & Plan Note (Signed)
She is pleased with current OCPs. No changes made.   She is due for labs today.  Orders Placed This Encounter  Procedures  . CBC with Differential/Platelet  . Comprehensive metabolic panel  . Lipid panel  . TSH

## 2019-07-18 NOTE — Assessment & Plan Note (Addendum)
Reviewed preventive care protocols, scheduled due services, and updated immunizations Discussed nutrition, exercise, diet, and healthy lifestyle.  Pap smear done today.  Tdap given.  Already had her flu shot.  Orders Placed This Encounter  Procedures  . Tdap vaccine greater than or equal to 25yo IM  . CBC with Differential/Platelet  . Comprehensive metabolic panel  . Lipid panel  . TSH  . QuantiFERON-TB Gold Plus  . Pain Mgmt, Profile 8 w/Conf, U

## 2019-07-18 NOTE — Assessment & Plan Note (Signed)
Doing well. Due for UDS as well today. The patient indicates understanding of these issues and agrees with the plan.

## 2019-07-18 NOTE — Patient Instructions (Signed)
Great to see you today, Sherri Harris. I will call you with your lab results from today and you can view them online.

## 2019-07-19 LAB — COMPREHENSIVE METABOLIC PANEL
ALT: 12 U/L (ref 0–35)
AST: 13 U/L (ref 0–37)
Albumin: 4.5 g/dL (ref 3.5–5.2)
Alkaline Phosphatase: 49 U/L (ref 39–117)
BUN: 10 mg/dL (ref 6–23)
CO2: 22 mEq/L (ref 19–32)
Calcium: 9.6 mg/dL (ref 8.4–10.5)
Chloride: 103 mEq/L (ref 96–112)
Creatinine, Ser: 0.74 mg/dL (ref 0.40–1.20)
GFR: 95.76 mL/min (ref >60.00)
Glucose, Bld: 78 mg/dL (ref 70–99)
Potassium: 3.9 mEq/L (ref 3.5–5.1)
Sodium: 135 mEq/L (ref 135–145)
Total Bilirubin: 0.6 mg/dL (ref 0.2–1.2)
Total Protein: 7.5 g/dL (ref 6.0–8.3)

## 2019-07-19 LAB — LIPID PANEL
Cholesterol: 190 mg/dL (ref 0–200)
HDL: 53.1 mg/dL (ref >39.00)
LDL Cholesterol: 105 mg/dL — ABNORMAL HIGH (ref 0–99)
NonHDL: 137.05
Total CHOL/HDL Ratio: 4
Triglycerides: 160 mg/dL — ABNORMAL HIGH (ref 0.0–149.0)
VLDL: 32 mg/dL (ref 0.0–40.0)

## 2019-07-19 LAB — CBC WITH DIFFERENTIAL/PLATELET
Basophils Absolute: 0.2 10*3/uL — ABNORMAL HIGH (ref 0.0–0.1)
Basophils Relative: 1.8 % (ref 0.0–3.0)
Eosinophils Absolute: 0.1 10*3/uL (ref 0.0–0.7)
Eosinophils Relative: 1.1 % (ref 0.0–5.0)
HCT: 42.1 % (ref 36.0–46.0)
Hemoglobin: 13.9 g/dL (ref 12.0–15.0)
Lymphocytes Relative: 17.9 % (ref 12.0–46.0)
Lymphs Abs: 1.9 10*3/uL (ref 0.7–4.0)
MCHC: 32.9 g/dL (ref 30.0–36.0)
MCV: 94.1 fl (ref 78.0–100.0)
Monocytes Absolute: 0.5 10*3/uL (ref 0.1–1.0)
Monocytes Relative: 5 % (ref 3.0–12.0)
Neutro Abs: 7.7 10*3/uL (ref 1.4–7.7)
Neutrophils Relative %: 74.2 % (ref 43.0–77.0)
Platelets: 437 10*3/uL — ABNORMAL HIGH (ref 150.0–400.0)
RBC: 4.47 Mil/uL (ref 3.87–5.11)
RDW: 13 % (ref 11.5–15.5)
WBC: 10.4 10*3/uL (ref 4.0–10.5)

## 2019-07-19 LAB — TSH: TSH: 0.63 u[IU]/mL (ref 0.35–4.50)

## 2019-07-21 LAB — PAIN MGMT, PROFILE 8 W/CONF, U
6 Acetylmorphine: NEGATIVE ng/mL
Alcohol Metabolites: NEGATIVE ng/mL (ref <500)
Amphetamines: NEGATIVE ng/mL
Benzodiazepines: NEGATIVE ng/mL
Buprenorphine, Urine: NEGATIVE ng/mL
Cocaine Metabolite: NEGATIVE ng/mL
Creatinine: 142.5 mg/dL
MDMA: NEGATIVE ng/mL
Marijuana Metabolite: NEGATIVE ng/mL
Opiates: NEGATIVE ng/mL
Oxidant: NEGATIVE ug/mL
Oxycodone: NEGATIVE ng/mL
pH: 5.3 (ref 4.5–9.0)

## 2019-07-21 LAB — QUANTIFERON-TB GOLD PLUS
Mitogen-NIL: >10 IU/mL
NIL: 0.04 IU/mL
QuantiFERON-TB Gold Plus: NEGATIVE
TB1-NIL: 0 IU/mL
TB2-NIL: 0.02 IU/mL

## 2019-07-22 LAB — CYTOLOGY - PAP
Chlamydia: NEGATIVE
Diagnosis: NEGATIVE
HSV1: NEGATIVE
HSV2: NEGATIVE
Neisseria Gonorrhea: NEGATIVE
Trichomonas: NEGATIVE

## 2019-07-28 MED FILL — LO LOESTRIN FE 1-10 TABLET: 1 MG-10 MCG | 84 days supply | Qty: 84 | Fill #0

## 2019-08-04 ENCOUNTER — Other Ambulatory Visit: Payer: Self-pay

## 2019-08-04 ENCOUNTER — Encounter: Payer: Self-pay | Admitting: Emergency Medicine

## 2019-08-04 ENCOUNTER — Telehealth: Payer: Self-pay | Admitting: Family Medicine

## 2019-08-04 ENCOUNTER — Encounter: Payer: Self-pay | Admitting: Family Medicine

## 2019-08-04 ENCOUNTER — Ambulatory Visit
Admission: EM | Admit: 2019-08-04 | Discharge: 2019-08-04 | Disposition: A | Discharge status: Home / Self Care | Payer: No Typology Code available for payment source | Attending: Family Medicine | Admitting: Family Medicine

## 2019-08-04 DIAGNOSIS — E876 Hypokalemia: Secondary | ICD-10-CM | POA: Diagnosis not present

## 2019-08-04 DIAGNOSIS — E860 Dehydration: Secondary | ICD-10-CM

## 2019-08-04 DIAGNOSIS — R112 Nausea with vomiting, unspecified: Secondary | ICD-10-CM

## 2019-08-04 DIAGNOSIS — R1013 Epigastric pain: Secondary | ICD-10-CM

## 2019-08-04 LAB — URINALYSIS, COMPLETE (UACMP) WITH MICROSCOPIC
Bilirubin Urine: NEGATIVE
Glucose, UA: NEGATIVE mg/dL
Nitrite: NEGATIVE
Protein, ur: 100 mg/dL — AB
Specific Gravity, Urine: 1.015 (ref 1.005–1.030)
pH: 8.5 — ABNORMAL HIGH (ref 5.0–8.0)

## 2019-08-04 LAB — CBC WITH DIFFERENTIAL/PLATELET
Abs Immature Granulocytes: 0.03 10*3/uL (ref 0.00–0.07)
Basophils Absolute: 0.1 10*3/uL (ref 0.0–0.1)
Basophils Relative: 1 %
Eosinophils Absolute: 0 10*3/uL (ref 0.0–0.5)
Eosinophils Relative: 0 %
HCT: 41.8 % (ref 36.0–46.0)
Hemoglobin: 14.8 g/dL (ref 12.0–15.0)
Immature Granulocytes: 0 %
Lymphocytes Relative: 18 %
Lymphs Abs: 1.5 10*3/uL (ref 0.7–4.0)
MCH: 30.5 pg (ref 26.0–34.0)
MCHC: 35.4 g/dL (ref 30.0–36.0)
MCV: 86.2 fL (ref 80.0–100.0)
Monocytes Absolute: 0.4 10*3/uL (ref 0.1–1.0)
Monocytes Relative: 5 %
Neutro Abs: 6.4 10*3/uL (ref 1.7–7.7)
Neutrophils Relative %: 76 %
Platelets: 329 10*3/uL (ref 150–400)
RBC: 4.85 MIL/uL (ref 3.87–5.11)
RDW: 11.9 % (ref 11.5–15.5)
WBC: 8.4 10*3/uL (ref 4.0–10.5)
nRBC: 0 % (ref 0.0–0.2)

## 2019-08-04 LAB — COMPREHENSIVE METABOLIC PANEL
ALT: 19 U/L (ref 0–44)
AST: 23 U/L (ref 15–41)
Albumin: 4.4 g/dL (ref 3.5–5.0)
Alkaline Phosphatase: 56 U/L (ref 38–126)
Anion gap: 13 (ref 5–15)
BUN: 12 mg/dL (ref 6–20)
CO2: 23 mmol/L (ref 22–32)
Calcium: 9.2 mg/dL (ref 8.9–10.3)
Chloride: 98 mmol/L (ref 98–111)
Creatinine, Ser: 0.75 mg/dL (ref 0.44–1.00)
GFR calc Af Amer: >60 mL/min (ref >60)
GFR calc non Af Amer: >60 mL/min (ref >60)
Glucose, Bld: 117 mg/dL — ABNORMAL HIGH (ref 70–99)
Potassium: 3.2 mmol/L — ABNORMAL LOW (ref 3.5–5.1)
Sodium: 134 mmol/L — ABNORMAL LOW (ref 135–145)
Total Bilirubin: 1.6 mg/dL — ABNORMAL HIGH (ref 0.3–1.2)
Total Protein: 7.9 g/dL (ref 6.5–8.1)

## 2019-08-04 LAB — LIPASE, BLOOD: Lipase: 27 U/L (ref 11–51)

## 2019-08-04 LAB — PREGNANCY, URINE: Preg Test, Ur: NEGATIVE

## 2019-08-04 MED ORDER — PANTOPRAZOLE SODIUM 40 MG PO TBEC: 40.0000 mg | DELAYED_RELEASE_TABLET | Freq: Every day | ORAL | 0 refills | Status: DC

## 2019-08-04 MED ORDER — SODIUM CHLORIDE 0.9 % IV BOLUS
1000.0000 mL | Freq: Once | INTRAVENOUS | Status: AC
  Administered 2019-08-04: 1000 mL via INTRAVENOUS

## 2019-08-04 MED ORDER — ONDANSETRON HCL 4 MG/2ML IJ SOLN
4.0000 mg | Freq: Once | INTRAMUSCULAR | Status: AC
  Administered 2019-08-04: 4 mg via INTRAVENOUS

## 2019-08-04 MED ORDER — POTASSIUM CHLORIDE CRYS ER 20 MEQ PO TBCR: 20.0000 meq | EXTENDED_RELEASE_TABLET | Freq: Two times a day (BID) | ORAL | 0 refills | Status: DC

## 2019-08-04 MED ORDER — ONDANSETRON 4 MG PO TBDP: 4.0000 mg | ORAL_TABLET | Freq: Three times a day (TID) | ORAL | 0 refills | Status: DC | PRN

## 2019-08-04 NOTE — ED Triage Notes (Signed)
Patient states she has been vomiting and nausea and has bad abdominal pain. This has been going on for four days   Denies diarrhea and fever

## 2019-08-04 NOTE — Telephone Encounter (Signed)
I have printed this form out and placed it on your desk.

## 2019-08-04 NOTE — Telephone Encounter (Signed)
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  She has  attached the form to this email.  Patient would like to know can the from be completed and returned back to her? [phone]- [phone]

## 2019-08-04 NOTE — ED Provider Notes (Signed)
[facility]    CSN: [id number] Arrival date & time: 08/04/19  0904      History   Chief Complaint Chief Complaint  Patient presents with  . Abdominal Pain  . Emesis  . Nausea   HPI  23 year old female presents with the above complaints.  Patient reports a 4-day history of nausea, vomiting, and abdominal pain.  Patient states that she is unable to keep anything down.  Denies hematemesis.  Denies fever.  She is having regular bowel movements.  Patient believes that this was precipitated by recent alcohol intake.  Patient states that she drank a bottle of wine prior to development of her symptoms.  Localizes the pain to the epigastric region.  No medications were interventions tried.  Patient states that she feels very poorly.  She feels dehydrated.  She feels that she needs IV fluids.  Patient rates her pain as 7/10 in severity.  No other associated symptoms.  No other complaints.  Past Medical History:  Diagnosis Date  . Anxiety   . Hypertension    Patient Active Problem List   Diagnosis Date Noted  . Encounter for well woman exam with routine gynecological exam 07/17/2019  . Acute alcohol abuse, with unspecified complication (HCC) 02/17/2019  . Anxiety and depression 10/14/2018  . Palpitations 10/14/2018  . Anxiety 06/17/2017  . Elevated blood pressure reading 11/10/2016  . 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 HTN (hypertension) 08/22/2016  . Contraception management 07/23/2016    Past Surgical History:  Procedure Laterality Date  . TYMPANOSTOMY TUBE PLACEMENT    . WISDOM TOOTH EXTRACTION      OB History   No obstetric history on file.      Home Medications    Prior to Admission medications   Medication Sig Start Date End Date Taking? Authorizing Provider  carvedilol (COREG) 12.5 MG tablet Take 1/2 tablet by mouth twice a day for 1 week then take 1 tablet by mouth twice a day. Make annual appt for future refills.Thank you 04/18/19  Yes  [facility], Roe Rutherford, PA  escitalopram (LEXAPRO) 20 MG tablet Take 1 tablet (20 mg total) by mouth daily. 07/05/19  Yes Dianne Dun, MD  naltrexone (DEPADE) 50 MG tablet Take 1 tablet (50 mg total) by mouth daily. 07/05/19  Yes Dianne Dun, MD  Norethindrone-Ethinyl Estradiol-Fe Biphas (LO LOESTRIN FE) 1 MG-10 MCG / 10 MCG tablet Take 1 tablet by mouth daily. 07/18/19  Yes Dianne Dun, MD  QUEtiapine (SEROQUEL) 50 MG tablet Take 1 tablet (50 mg total) by mouth daily. 07/05/19  Yes Dianne Dun, MD  ondansetron (ZOFRAN-ODT) 4 MG disintegrating tablet Take 1 tablet (4 mg total) by mouth every 8 (eight) hours as needed for nausea or vomiting. 08/04/19   Tommie Sams, DO  pantoprazole (PROTONIX) 40 MG tablet Take 1 tablet (40 mg total) by mouth daily. 08/04/19   Tommie Sams, DO  potassium chloride SA (KLOR-CON) 20 MEQ tablet Take 1 tablet (20 mEq total) by mouth 2 (two) times daily for 3 days. 08/04/19 08/07/19  Tommie Sams, DO    Family History Family History  Problem Relation Age of Onset  . Hypertension Mother   . Thyroid disease Mother   . Hypertension Maternal Aunt   . Hypertension Maternal Uncle   . Heart disease Maternal Grandmother   . Cancer Maternal Grandmother        Breast  . Hypertension Father   . Cardiomyopathy Father  Social History Social History   Tobacco Use  . Smoking status: Never Smoker  . Smokeless tobacco: Never Used  Substance Use Topics  . Alcohol use: Yes    Alcohol/week: 0.0 standard drinks    Comment: occasional  . Drug use: No     Allergies   Amoxicillin and Penicillins   Review of Systems Review of Systems  Constitutional: Positive for appetite change and fatigue.  Gastrointestinal: Positive for abdominal pain, nausea and vomiting.   Physical Exam Triage Vital Signs ED Triage Vitals  Enc Vitals Group     BP 08/04/19 0918 (!) 114/101     Pulse Rate 08/04/19 0918 100     Resp 08/04/19 0918 20     Temp 08/04/19 0918 97.7 F (36.5  C)     Temp Source 08/04/19 0918 Oral     SpO2 08/04/19 0918 100 %     Weight 08/04/19 0914 160 lb (72.6 kg)     Height 08/04/19 0914 5\' 10"  (1.778 m)     Head Circumference --      Peak Flow --      Pain Score 08/04/19 0914 7     Pain Loc --      Pain Edu? --      Excl. in [place]? --    Updated Vital Signs BP (!) 114/101 (BP Location: Left Arm)   Pulse 100   Temp 97.7 F (36.5 C) (Oral)   Resp 20   Ht 5\' 10"  (1.778 m)   Wt 72.6 kg   LMP  (LMP Unknown)   SpO2 100%   BMI 22.96 kg/m   Visual Acuity Right Eye Distance:   Left Eye Distance:   Bilateral Distance:    Right Eye Near:   Left Eye Near:    Bilateral Near:     Physical Exam Vitals signs and nursing note reviewed.  Constitutional:      Comments: Appears mildly ill.  No apparent acute distress.  HENT:     Head: Normocephalic and atraumatic.     Mouth/Throat:     Pharynx: Oropharynx is clear. No posterior oropharyngeal erythema.  Eyes:     General:        Right eye: No discharge.        Left eye: No discharge.     Conjunctiva/sclera: Conjunctivae normal.  Cardiovascular:     Rate and Rhythm: Regular rhythm. Tachycardia present.  Pulmonary:     Effort: Pulmonary effort is normal.     Breath sounds: Normal breath sounds. No wheezing, rhonchi or rales.  Abdominal:     General: There is no distension.     Palpations: Abdomen is soft.     Comments: Exquisite tenderness to palpation in the epigastric region.  Skin:    General: Skin is warm.     Findings: No rash.  Neurological:     Mental Status: She is alert.  Psychiatric:        Mood and Affect: Mood normal.        Behavior: Behavior normal.    UC Treatments / Results  Labs (all labs ordered are listed, but only abnormal results are displayed) Labs Reviewed  URINALYSIS, COMPLETE (UACMP) WITH MICROSCOPIC - Abnormal; Notable for the following components:      Result Value   APPearance CLOUDY (*)    pH 8.5 (*)    Hgb urine dipstick TRACE (*)     Ketones, ur TRACE (*)    Protein, ur 100 (*)  Leukocytes,Ua SMALL (*)    Bacteria, UA MANY (*)    All other components within normal limits  COMPREHENSIVE METABOLIC PANEL - Abnormal; Notable for the following components:   Sodium 134 (*)    Potassium 3.2 (*)    Glucose, Bld 117 (*)    Total Bilirubin 1.6 (*)    All other components within normal limits  PREGNANCY, URINE  CBC WITH DIFFERENTIAL/PLATELET  LIPASE, BLOOD    EKG   Radiology No results found.  Procedures Procedures (including critical care time)  Medications Ordered in UC Medications  ondansetron (ZOFRAN) injection 4 mg (4 mg Intravenous Given 08/04/19 0953)  sodium chloride 0.9 % bolus 1,000 mL (0 mLs Intravenous Stopped 08/04/19 1035)    Initial Impression / Assessment and Plan / UC Course  I have reviewed the triage vital signs and the nursing notes.  Pertinent labs & imaging results that were available during my care of the patient were reviewed by me and considered in my medical decision making (see chart for details).     23 year old female presents with intractable nausea and vomiting, epigastric pain, dehydration.  Laboratory studies notable for mild hypokalemia with a potassium of 3.2.  IV fluids and Zofran given today.  Patient felt much improved after IV fluids and Zofran.  Sending home on Zofran, and Protonix (concern for possible gastritis secondary to alcohol use).  Also sent a prescription for a few days of potassium repletion.  School note given.  Supportive care.  Advised to contact primary care physician regarding referral to psychology.  Final Clinical Impressions(s) / UC Diagnoses   Final diagnoses:  Intractable vomiting with nausea, unspecified vomiting type  Abdominal pain, epigastric  Hypokalemia  Dehydration     Discharge Instructions     Lots of fluids.  Medications as directed.  Call [facility] regarding your referral to psychology.  Take care and best of luck.  Take care   Dr. Adriana Simasook     ED Prescriptions    Medication Sig Dispense Auth. Provider   ondansetron (ZOFRAN-ODT) 4 MG disintegrating tablet Take 1 tablet (4 mg total) by mouth every 8 (eight) hours as needed for nausea or vomiting. 20 tablet Nivedita Mirabella G, DO   pantoprazole (PROTONIX) 40 MG tablet Take 1 tablet (40 mg total) by mouth daily. 14 tablet Azarias Chiou G, DO   potassium chloride SA (KLOR-CON) 20 MEQ tablet Take 1 tablet (20 mEq total) by mouth 2 (two) times daily for 3 days. 6 tablet Tommie Samsook, Carry Ortez G, DO     PDMP not reviewed this encounter.   Tommie SamsCook, Atwell Mcdanel G, DO 08/04/19 1057

## 2019-08-04 NOTE — Telephone Encounter (Signed)
I have printed form off for Dr. Deborra Medina.  I have contacted pt via [place] message to make her aware that we will call her when form is ready to be picked up.

## 2019-08-04 NOTE — Discharge Instructions (Addendum)
Lots of fluids.  Medications as directed.  Call [facility] regarding your referral to psychology.  Take care and best of luck.  Take care  Dr. Lacinda Axon

## 2019-08-11 NOTE — Telephone Encounter (Signed)
Spoke with patient and she's going to contact [facility] for the information she needs.

## 2019-08-17 ENCOUNTER — Encounter: Payer: Self-pay | Admitting: Family Medicine

## 2019-09-12 ENCOUNTER — Telehealth: Payer: No Typology Code available for payment source | Admitting: Physician Assistant

## 2019-09-12 DIAGNOSIS — J019 Acute sinusitis, unspecified: Secondary | ICD-10-CM

## 2019-09-12 MED ORDER — FLUTICASONE PROPIONATE 50 MCG/ACT NA SUSP: 2.0000 | Freq: Every day | NASAL | 0 refills | Status: DC

## 2019-09-12 MED ORDER — DOXYCYCLINE HYCLATE 100 MG PO TABS: 100.0000 mg | ORAL_TABLET | Freq: Two times a day (BID) | ORAL | 0 refills | Status: DC

## 2019-09-12 MED FILL — DOXYCYCLINE HYCLATE 100 MG: 100 | 10 days supply | Qty: 20 | Fill #0

## 2019-09-12 MED FILL — FLUTICASONE PROP 50 MCG SPR: 50 | 30 days supply | Qty: 16 | Fill #0

## 2019-09-12 NOTE — Progress Notes (Signed)
We are sorry that you are not feeling well.  Here is how we plan to help!  Based on what you have shared with me it looks like you have sinusitis.  Sinusitis is inflammation and infection in the sinus cavities of the head.  Based on your presentation I believe you most likely have Acute Bacterial Sinusitis.  This is an infection caused by bacteria and is treated with antibiotics. I have prescribed Doxycycline 100mg  by mouth twice a day for 10 days. I have also prescribed a nasal spray called Fluticasone to help with your symptoms. You may use an oral decongestant such as Mucinex D or if you have glaucoma or high blood pressure use plain Mucinex. Saline nasal spray help and can safely be used as often as needed for congestion.  If you develop worsening sinus pain, fever or notice severe headache and vision changes, or if symptoms are not better after completion of antibiotic, please schedule an appointment with a health care provider.    Sinus infections are not as easily transmitted as other respiratory infection, however we still recommend that you avoid close contact with loved ones, especially the very young and elderly.  Remember to wash your hands thoroughly throughout the day as this is the number one way to prevent the spread of infection!  Home Care:  Only take medications as instructed by your medical team.  Complete the entire course of an antibiotic.  Do not take these medications with alcohol.  A steam or ultrasonic humidifier can help congestion.  You can place a towel over your head and breathe in the steam from hot water coming from a faucet.  Avoid close contacts especially the very young and the elderly.  Cover your mouth when you cough or sneeze.  Always remember to wash your hands.  Get Help Right Away If:  You develop worsening fever or sinus pain.  You develop a severe head ache or visual changes.  Your symptoms persist after you have completed your treatment  plan.  Make sure you  Understand these instructions.  Will watch your condition.  Will get help right away if you are not doing well or get worse.  Your e-visit answers were reviewed by a board certified advanced clinical practitioner to complete your personal care plan.  Depending on the condition, your plan could have included both over the counter or prescription medications.  If there is a problem please reply  once you have received a response from your provider.  Your safety is important to [name].  If you have drug allergies check your prescription carefully.    You can use MyChart to ask questions about today's visit, request a non-urgent call back, or ask for a work or school excuse for 24 hours related to this e-Visit. If it has been greater than 24 hours you will need to follow up with your provider, or enter a new e-Visit to address those concerns.  You will get an e-mail in the next two days asking about your experience.  I hope that your e-visit has been valuable and will speed your recovery. Thank you for using e-visits.  Approximately 5 minutes was spent documenting and reviewing patient's chart.

## 2019-09-13 ENCOUNTER — Encounter: Payer: Self-pay | Admitting: Family Medicine

## 2019-11-03 ENCOUNTER — Other Ambulatory Visit: Payer: No Typology Code available for payment source

## 2019-11-03 ENCOUNTER — Ambulatory Visit (INDEPENDENT_AMBULATORY_CARE_PROVIDER_SITE_OTHER): Payer: No Typology Code available for payment source | Admitting: Internal Medicine

## 2019-11-03 ENCOUNTER — Encounter: Payer: Self-pay | Admitting: Internal Medicine

## 2019-11-03 ENCOUNTER — Ambulatory Visit: Payer: BC Managed Care – PPO | Attending: Internal Medicine

## 2019-11-03 DIAGNOSIS — R067 Sneezing: Secondary | ICD-10-CM

## 2019-11-03 DIAGNOSIS — R0789 Other chest pain: Secondary | ICD-10-CM | POA: Diagnosis not present

## 2019-11-03 DIAGNOSIS — R05 Cough: Secondary | ICD-10-CM

## 2019-11-03 DIAGNOSIS — R059 Cough, unspecified: Secondary | ICD-10-CM

## 2019-11-03 DIAGNOSIS — Z20822 Contact with and (suspected) exposure to covid-19: Secondary | ICD-10-CM

## 2019-11-03 DIAGNOSIS — R430 Anosmia: Secondary | ICD-10-CM

## 2019-11-03 DIAGNOSIS — R0602 Shortness of breath: Secondary | ICD-10-CM | POA: Diagnosis not present

## 2019-11-03 MED ORDER — PREDNISONE 10 MG PO TABS: ORAL_TABLET | ORAL | 0 refills | Status: DC

## 2019-11-03 MED ORDER — ALBUTEROL SULFATE HFA 108 (90 BASE) MCG/ACT IN AERS: 2.0000 | INHALATION_SPRAY | Freq: Four times a day (QID) | RESPIRATORY_TRACT | 0 refills | Status: DC | PRN

## 2019-11-03 NOTE — Progress Notes (Signed)
Virtual Visit via Video Note  I connected with Sherri Harris on 11/03/19 at  2:15 PM EST by a video enabled telemedicine application and verified that I am speaking with the correct person using two identifiers.  Location: Patient: Home Provider: Office   I discussed the limitations of evaluation and management by telemedicine and the availability of in person appointments. The patient expressed understanding and agreed to proceed.  History of Present Illness:  Pt reports sneezing, cough, chest tightness and SOB. This started 3 days ago. The cough is productive of yellow mucous. She is intermittently SOB, mainly with coughing spells. She reports associated loss of smell but denies headache, runny nose, nasal congestion, ear pain, sore throat or loss of taste. She denies fever, chills or body aches. She has tried Ibuprofen without any relief. She has been Covid tested, results pending.  Past Medical History:  Diagnosis Date  . Anxiety   . Hypertension     Current Outpatient Medications  Medication Sig Dispense Refill  . carvedilol (COREG) 12.5 MG tablet Take 1/2 tablet by mouth twice a day for 1 week then take 1 tablet by mouth twice a day. Make annual appt for future refills.Thank you 180 tablet 0  . escitalopram (LEXAPRO) 20 MG tablet Take 1 tablet (20 mg total) by mouth daily. 90 tablet 3  . naltrexone (DEPADE) 50 MG tablet Take 1 tablet (50 mg total) by mouth daily. 90 tablet 3  . Norethindrone-Ethinyl Estradiol-Fe Biphas (LO LOESTRIN FE) 1 MG-10 MCG / 10 MCG tablet Take 1 tablet by mouth daily. 3 Package 3  . fluticasone (FLONASE) 50 MCG/ACT nasal spray Place 2 sprays into both nostrils daily. (Patient not taking: Reported on 11/03/2019) 16 g 0   No current facility-administered medications for this visit.    Allergies  Allergen Reactions  . Amoxicillin Rash    REACTION: rash  . Penicillins Rash    Family History  Problem Relation Age of Onset  . Hypertension Mother   .  Thyroid disease Mother   . Hypertension Maternal Aunt   . Hypertension Maternal Uncle   . Heart disease Maternal Grandmother   . Cancer Maternal Grandmother        Breast  . Hypertension Father   . Cardiomyopathy Father     Social History   Socioeconomic History  . Marital status: Single    Spouse name: Not on file  . Number of children: Not on file  . Years of education: Not on file  . Highest education level: Not on file  Occupational History  . Not on file  Tobacco Use  . Smoking status: Never Smoker  . Smokeless tobacco: Never Used  Substance and Sexual Activity  . Alcohol use: Yes    Alcohol/week: 0.0 standard drinks    Comment: occasional  . Drug use: No  . Sexual activity: Yes    Birth control/protection: Pill  Other Topics Concern  . Not on file  Social History Narrative  . Not on file   Social Determinants of Health   Financial Resource Strain:   . Difficulty of Paying Living Expenses: Not on file  Food Insecurity:   . Worried About Programme researcher, broadcasting/film/video in the Last Year: Not on file  . Ran Out of Food in the Last Year: Not on file  Transportation Needs:   . Lack of Transportation (Medical): Not on file  . Lack of Transportation (Non-Medical): Not on file  Physical Activity:   . Days of Exercise  per Week: Not on file  . Minutes of Exercise per Session: Not on file  Stress:   . Feeling of Stress : Not on file  Social Connections:   . Frequency of Communication with Friends and Family: Not on file  . Frequency of Social Gatherings with Friends and Family: Not on file  . Attends Religious Services: Not on file  . Active Member of Clubs or Organizations: Not on file  . Attends Banker Meetings: Not on file  . Marital Status: Not on file  Intimate Partner Violence:   . Fear of Current or Ex-Partner: Not on file  . Emotionally Abused: Not on file  . Physically Abused: Not on file  . Sexually Abused: Not on file     Constitutional: Denies  fever, malaise, fatigue, headache or abrupt weight changes.  HEENT: Pt reports sneezing, loss of smell. Denies eye pain, eye redness, ear pain, ringing in the ears, wax buildup, runny nose, nasal congestion, bloody nose, or sore throat. Respiratory: Pt reports cough, SOB. Denies difficulty breathing.   Cardiovascular: Pt reports chest tightness. Denies chest pain, palpitations or swelling in the hands or feet.  Gastrointestinal: Denies abdominal pain, bloating, constipation, diarrhea or blood in the stool.   No other specific complaints in a complete review of systems (except as listed in HPI above).  Observations/Objective:   Wt Readings from Last 3 Encounters:  08/04/19 160 lb (72.6 kg)  07/18/19 158 lb 3.2 oz (71.8 kg)  07/05/19 161 lb 3.2 oz (73.1 kg)    General: Appears her stated age, in NAD. HEENT: Head: normal shape and size; Nose: sounds congested; Throat/Mouth: no hoarseness noted.  Pulmonary/Chest: Normal effort. No respiratory distress.  Neurological: Alert and oriented.    BMET    Component Value Date/Time   NA 134 (L) 08/04/2019 0943   NA 138 01/22/2018 1012   K 3.2 (L) 08/04/2019 0943   CL 98 08/04/2019 0943   CO2 23 08/04/2019 0943   GLUCOSE 117 (H) 08/04/2019 0943   BUN 12 08/04/2019 0943   BUN 9 01/22/2018 1012   CREATININE 0.75 08/04/2019 0943   CALCIUM 9.2 08/04/2019 0943   GFRNONAA >60 08/04/2019 0943   GFRAA >60 08/04/2019 0943    Lipid Panel     Component Value Date/Time   CHOL 190 07/18/2019 1535   TRIG 160.0 (H) 07/18/2019 1535   HDL 53.10 07/18/2019 1535   CHOLHDL 4 07/18/2019 1535   VLDL 32.0 07/18/2019 1535   LDLCALC 105 (H) 07/18/2019 1535    CBC    Component Value Date/Time   WBC 8.4 08/04/2019 0943   RBC 4.85 08/04/2019 0943   HGB 14.8 08/04/2019 0943   HGB 14.5 01/22/2018 1012   HCT 41.8 08/04/2019 0943   HCT 42.2 01/22/2018 1012   PLT 329 08/04/2019 0943   PLT 337 01/22/2018 1012   MCV 86.2 08/04/2019 0943   MCV 90  01/22/2018 1012   MCH 30.5 08/04/2019 0943   MCHC 35.4 08/04/2019 0943   RDW 11.9 08/04/2019 0943   RDW 12.4 01/22/2018 1012   LYMPHSABS 1.5 08/04/2019 0943   MONOABS 0.4 08/04/2019 0943   EOSABS 0.0 08/04/2019 0943   BASOSABS 0.1 08/04/2019 0943    Hgb A1C No results found for: HGBA1C      Assessment and Plan:  Sneezing, Cough, SOB, Chest Tightness and Loss of Smell:  Could be COVID- results pending Encouraged rest and fluids RX for Pred Taper x 6 days RX for Albuterol inhaler  as needed Encouraged self quarantine, frequent handwashing, masking and social distancing even in the home ER precautions discussed  Follow Up Instructions:    I discussed the assessment and treatment plan with the patient. The patient was provided an opportunity to ask questions and all were answered. The patient agreed with the plan and demonstrated an understanding of the instructions.   The patient was advised to call back or seek an in-person evaluation if the symptoms worsen or if the condition fails to improve as anticipated.    Webb Silversmith, NP

## 2019-11-04 ENCOUNTER — Encounter: Payer: Self-pay | Admitting: Internal Medicine

## 2019-11-04 LAB — NOVEL CORONAVIRUS, NAA: SARS-CoV-2, NAA: NOT DETECTED

## 2019-11-06 ENCOUNTER — Telehealth: Payer: BC Managed Care – PPO | Admitting: Family

## 2019-11-06 DIAGNOSIS — R05 Cough: Secondary | ICD-10-CM

## 2019-11-06 DIAGNOSIS — R059 Cough, unspecified: Secondary | ICD-10-CM

## 2019-11-06 DIAGNOSIS — R079 Chest pain, unspecified: Secondary | ICD-10-CM

## 2019-11-06 NOTE — Progress Notes (Signed)
Based on what you shared with me, I feel your condition warrants further evaluation and I recommend that you be seen for a face to face office visit.  Given your symptoms of chest pain, you need to be seen face to face to rule out pneumonia or some other serious condition.    NOTE: If you entered your credit card information for this eVisit, you will not be charged. You may see a "hold" on your card for the $35 but that hold will drop off and you will not have a charge processed.   If you are having a true medical emergency please call 911.      For an urgent face to face visit, [facility] has five urgent care centers for your convenience:      NEW:  [facility] at [facility] Directions [phone] [address] [place], [place] [address] . 10 am - 6pm Monday - Friday    [facility]gent Care Center [facility]) Get Driving Directions [phone] [address] [place], [place] [address] . 10 am to 8 pm Monday-Friday . 12 pm to 8 pm [facility] Urgent Care at [facility] Get Driving Directions [phone] [address] [place], [place] [address] . 8 am to 8 pm Monday-Friday . 9 am to 6 pm Saturday . 11 am to 6 pm Sunday     [facility] Health Urgent Care at [facility] Get Driving Directions  [phone] [address] [place], [place] [address] . 8 am to 8 pm Monday-Friday . 8 am to 4 pm [facility] Urgent Care at [facility] Directions [phone] [address] [place], [place] [address] . 12 pm to 6 pm Monday-Friday      Your e-visit answers were reviewed by a board certified advanced clinical practitioner to complete your personal care plan.  Thank you for using e-Visits.

## 2019-11-06 NOTE — Patient Instructions (Signed)

## 2019-11-07 ENCOUNTER — Encounter: Payer: Self-pay | Admitting: Family Medicine

## 2019-11-07 ENCOUNTER — Ambulatory Visit (INDEPENDENT_AMBULATORY_CARE_PROVIDER_SITE_OTHER): Payer: No Typology Code available for payment source | Admitting: Family Medicine

## 2019-11-07 DIAGNOSIS — J988 Other specified respiratory disorders: Secondary | ICD-10-CM | POA: Diagnosis not present

## 2019-11-07 MED ORDER — ALBUTEROL SULFATE HFA 108 (90 BASE) MCG/ACT IN AERS: 2.0000 | INHALATION_SPRAY | Freq: Four times a day (QID) | RESPIRATORY_TRACT | 0 refills | Status: DC | PRN

## 2019-11-07 MED ORDER — AZITHROMYCIN 250 MG PO TABS: ORAL_TABLET | ORAL | 0 refills | Status: DC

## 2019-11-07 MED ORDER — PSEUDOEPH-BROMPHEN-DM 30-2-10 MG/5ML PO SYRP: 5.0000 mL | ORAL_SOLUTION | Freq: Three times a day (TID) | ORAL | 0 refills | Status: DC | PRN

## 2019-11-07 NOTE — Progress Notes (Signed)
Sherri Harris - 24 y.o. female MRN [id number]  Date of birth: [birth year]   This visit type was conducted due to national recommendations for restrictions regarding the COVID-19 Pandemic (e.g. social distancing).  This format is felt to be most appropriate for this patient at this time.  All issues noted in this document were discussed and addressed.  No physical exam was performed (except for noted visual exam findings with Video Visits).  I discussed the limitations of evaluation and management by telemedicine and the availability of in person appointments. The patient expressed understanding and agreed to proceed.  I connected with@ on 11/07/19 at  1:00 PM EST by a video enabled telemedicine application and verified that I am speaking with the correct person using two identifiers.  Present at visit: Luetta Nutting, DO [name]  Patient Location: [place] [address]   Provider location:   Home office  Chief Complaint  Patient presents with  . Follow-up    pt c/o chest pains, SOB and fever, 100.7 on and off.  x 6days.  Pt has been taking advil as directed on bottle. Pt has tested negative for Covid 6 days ago.    HPI  Sherri Harris is a 24 y.o. female who presents via audio/video conferencing for a telehealth visit today.  She has complaint of chest tightness with productive cough, shortness of breath, fever,  Fatigue, chills, body aches.  Her symptoms started 6 days ago.  She had a negative COVID test on 11/03/2019.  She was seen last week and started on prednisone and albuterol inhaler.  Albuterol has provided some relief but she doesn't feel any better with prednisone.  Her cough is productive of thick, yellow phlegm.  No prior hx of asthma and she is a non-smoker.     ROS:  A comprehensive ROS was completed and negative except as noted per HPI  Past Medical History:  Diagnosis Date  . Anxiety   . Hypertension     Past Surgical History:  Procedure  Laterality Date  . TYMPANOSTOMY TUBE PLACEMENT    . WISDOM TOOTH EXTRACTION      Family History  Problem Relation Age of Onset  . Hypertension Mother   . Thyroid disease Mother   . Hypertension Maternal Aunt   . Hypertension Maternal Uncle   . Heart disease Maternal Grandmother   . Cancer Maternal Grandmother        Breast  . Hypertension Father   . Cardiomyopathy Father     Social History   Socioeconomic History  . Marital status: Single    Spouse name: Not on file  . Number of children: Not on file  . Years of education: Not on file  . Highest education level: Not on file  Occupational History  . Not on file  Tobacco Use  . Smoking status: Never Smoker  . Smokeless tobacco: Never Used  Substance and Sexual Activity  . Alcohol use: Yes    Alcohol/week: 0.0 standard drinks    Comment: occasional  . Drug use: No  . Sexual activity: Yes    Birth control/protection: Pill  Other Topics Concern  . Not on file  Social History Narrative  . Not on file   Social Determinants of Health   Financial Resource Strain:   . Difficulty of Paying Living Expenses: Not on file  Food Insecurity:   . Worried About Charity fundraiser in the Last Year: Not on file  .  Ran Out of Food in the Last Year: Not on file  Transportation Needs:   . Lack of Transportation (Medical): Not on file  . Lack of Transportation (Non-Medical): Not on file  Physical Activity:   . Days of Exercise per Week: Not on file  . Minutes of Exercise per Session: Not on file  Stress:   . Feeling of Stress : Not on file  Social Connections:   . Frequency of Communication with Friends and Family: Not on file  . Frequency of Social Gatherings with Friends and Family: Not on file  . Attends Religious Services: Not on file  . Active Member of Clubs or Organizations: Not on file  . Attends Banker Meetings: Not on file  . Marital Status: Not on file  Intimate Partner Violence:   . Fear of Current  or Ex-Partner: Not on file  . Emotionally Abused: Not on file  . Physically Abused: Not on file  . Sexually Abused: Not on file     Current Outpatient Medications:  .  albuterol (VENTOLIN HFA) 108 (90 Base) MCG/ACT inhaler, Inhale 2 puffs into the lungs every 6 (six) hours as needed for wheezing or shortness of breath., Disp: 8 g, Rfl: 0 .  carvedilol (COREG) 12.5 MG tablet, Take 1/2 tablet by mouth twice a day for 1 week then take 1 tablet by mouth twice a day. Make annual appt for future refills.Thank you, Disp: 180 tablet, Rfl: 0 .  escitalopram (LEXAPRO) 20 MG tablet, Take 1 tablet (20 mg total) by mouth daily., Disp: 90 tablet, Rfl: 3 .  fluticasone (FLONASE) 50 MCG/ACT nasal spray, Place 2 sprays into both nostrils daily., Disp: 16 g, Rfl: 0 .  naltrexone (DEPADE) 50 MG tablet, Take 1 tablet (50 mg total) by mouth daily., Disp: 90 tablet, Rfl: 3 .  Norethindrone-Ethinyl Estradiol-Fe Biphas (LO LOESTRIN FE) 1 MG-10 MCG / 10 MCG tablet, Take 1 tablet by mouth daily., Disp: 3 Package, Rfl: 3 .  predniSONE (DELTASONE) 10 MG tablet, Take 6 tabs day 1, 5 tabs day 2, 4 tabs day 3, 3 tabs day 4, 2 tabs day 5, 1 tab day 6, Disp: 21 tablet, Rfl: 0 .  azithromycin (ZITHROMAX) 250 MG tablet, Take 2 tabs on day 1 followed by 1 tab on days 2-5, Disp: 6 tablet, Rfl: 0 .  brompheniramine-pseudoephedrine-DM 30-2-10 MG/5ML syrup, Take 5 mLs by mouth 3 (three) times daily as needed (Cough)., Disp: 120 mL, Rfl: 0  EXAM:  VITALS per patient if applicable: Temp (!) 100.7 F (38.2 C) (Oral)   Ht 5\' 10"  (1.778 m)   Wt 155 lb (70.3 kg)   BMI 22.24 kg/m   GENERAL: alert, oriented, appears fatigued  HEENT: atraumatic, conjunttiva clear, no obvious abnormalities on inspection of external nose and ears  NECK: normal movements of the head and neck  LUNGS: on inspection no signs of respiratory distress, breathing rate appears normal, no obvious gross SOB, gasping or wheezing  CV: no obvious cyanosis  MS:  moves all visible extremities without noticeable abnormality  PSYCH/NEURO: pleasant and cooperative, no obvious depression or anxiety, speech and thought processing grossly intact  ASSESSMENT AND PLAN:  Discussed the following assessment and plan:  Respiratory infection COVID test negative.  DDX includes influenza vs pneumonia. Not a whole lot or improvement with steroid but has gotten some relief with albuterol.  Continue as needed.    Will start Azithromycin. Avoid narcotic containing cough syrups with active rx for naltrexone.  I  think she needs CXR as well for ongoing symptoms, scheduled for in person evaluation at respiratory clinic for tomorrow.      I discussed the assessment and treatment plan with the patient. The patient was provided an opportunity to ask questions and all were answered. The patient agreed with the plan and demonstrated an understanding of the instructions.   The patient was advised to call back or seek an in-person evaluation if the symptoms worsen or if the condition fails to improve as anticipated.    Everrett Coombe, DO

## 2019-11-07 NOTE — Assessment & Plan Note (Signed)
COVID test negative.  DDX includes influenza vs pneumonia. Not a whole lot or improvement with steroid but has gotten some relief with albuterol.  Continue as needed.    Will start Azithromycin. Avoid narcotic containing cough syrups with active rx for naltrexone.  I think she needs CXR as well for ongoing symptoms, scheduled for in person evaluation at respiratory clinic for tomorrow.

## 2019-11-08 ENCOUNTER — Ambulatory Visit: Payer: BC Managed Care – PPO | Admitting: Family Medicine

## 2019-11-08 ENCOUNTER — Ambulatory Visit (INDEPENDENT_AMBULATORY_CARE_PROVIDER_SITE_OTHER): Payer: No Typology Code available for payment source | Admitting: Family Medicine

## 2019-11-08 VITALS — BP 128/70 | HR 84 | Temp 98.7°F | Ht 70.0 in | Wt 156.0 lb

## 2019-11-08 DIAGNOSIS — Z1152 Encounter for screening for COVID-19: Secondary | ICD-10-CM

## 2019-11-08 DIAGNOSIS — R059 Cough, unspecified: Secondary | ICD-10-CM

## 2019-11-08 DIAGNOSIS — R0981 Nasal congestion: Secondary | ICD-10-CM

## 2019-11-08 DIAGNOSIS — R0602 Shortness of breath: Secondary | ICD-10-CM | POA: Diagnosis not present

## 2019-11-08 DIAGNOSIS — R05 Cough: Secondary | ICD-10-CM | POA: Diagnosis not present

## 2019-11-08 MED ORDER — IPRATROPIUM BROMIDE 0.03 % NA SOLN: 2.0000 | Freq: Two times a day (BID) | NASAL | 0 refills | Status: DC

## 2019-11-08 NOTE — Patient Instructions (Signed)
For x-ray imaging: Go [facility] :  8:00-4:30 pm  [address] [place], [place], [place] [address] Enter through [facility] and request x-ray department. You will be contacted either via phone or via Mychart with your x-ray result.    How to Use a Metered Dose Inhaler A metered dose inhaler is a handheld device for taking medicine that must be breathed into the lungs (inhaled). The device can be used to deliver a variety of inhaled medicines, including:  Quick relief or rescue medicines, such as bronchodilators.  Controller medicines, such as corticosteroids. The medicine is delivered by pushing down on a metal canister to release a preset amount of spray and medicine. Each device contains the amount of medicine that is needed for a preset number of uses (inhalations). Your health care provider may recommend that you use a spacer with your inhaler to help you take the medicine more effectively. A spacer is a plastic tube with a mouthpiece on one end and an opening that connects to the inhaler on the other end. A spacer holds the medicine in a tube for a short time, which allows you to inhale more medicine. What are the risks? If you do not use your inhaler correctly, medicine might not reach your lungs to help you breathe. Inhaler medicine can cause side effects, such as:  Mouth or throat infection.  Cough.  Hoarseness.  Headache.  Nausea and vomiting.  Lung infection (pneumonia) in people who have a lung condition called COPD. How to use a metered dose inhaler without a spacer  1. Remove the cap from the inhaler. 2. If you are using the inhaler for the first time, shake it for 5 seconds, turn it away from your face, then release 4 puffs into the air. This is called priming. 3. Shake the inhaler for 5 seconds. 4. Position the inhaler so the top of the canister faces up. 5. Put your index finger on the top of the medicine canister. Support the bottom of the inhaler with your  thumb. 6. Breathe out normally and as completely as possible, away from the inhaler. 7. Either place the inhaler between your teeth and close your lips tightly around the mouthpiece, or hold the inhaler 1-2 inches (2.5-5 cm) away from your open mouth. Keep your tongue down out of the way. If you are unsure which technique to use, ask your health care provider. 8. Press the canister down with your index finger to release the medicine, then inhale deeply and slowly through your mouth (not your nose) until your lungs are completely filled. Inhaling should take 4-6 seconds. 9. Hold the medicine in your lungs for 5-10 seconds (10 seconds is best). This helps the medicine get into the small airways of your lungs. 10. With your lips in a tight circle (pursed), breathe out slowly. 11. Repeat steps 3-10 until you have taken the number of puffs that your health care provider directed. Wait about 1 minute between puffs or as directed. 12. Put the cap on the inhaler. 13. If you are using a steroid inhaler, rinse your mouth with water, gargle, and spit out the water. Do not swallow the water. How to use a metered dose inhaler with a spacer  1. Remove the cap from the inhaler. 2. If you are using the inhaler for the first time, shake it for 5 seconds, turn it away from your face, then release 4 puffs into the air. This is called priming. 3. Shake the inhaler for  5 seconds. 4. Place the open end of the spacer onto the inhaler mouthpiece. 5. Position the inhaler so the top of the canister faces up and the spacer mouthpiece faces you. 6. Put your index finger on the top of the medicine canister. Support the bottom of the inhaler and the spacer with your thumb. 7. Breathe out normally and as completely as possible, away from the spacer. 8. Place the spacer between your teeth and close your lips tightly around it. Keep your tongue down out of the way. 9. Press the canister down with your index finger to release the  medicine, then inhale deeply and slowly through your mouth (not your nose) until your lungs are completely filled. Inhaling should take 4-6 seconds. 10. Hold the medicine in your lungs for 5-10 seconds (10 seconds is best). This helps the medicine get into the small airways of your lungs. 11. With your lips in a tight circle (pursed), breathe out slowly. 12. Repeat steps 3-11 until you have taken the number of puffs that your health care provider directed. Wait about 1 minute between puffs or as directed. 13. Remove the spacer from the inhaler and put the cap on the inhaler. 14. If you are using a steroid inhaler, rinse your mouth with water, gargle, and spit out the water. Do not swallow the water. Follow these instructions at home:  Take your inhaled medicine only as told by your health care provider. Do not use the inhaler more than directed by your health care provider.  Keep all follow-up visits as told by your health care provider. This is important.  If your inhaler has a counter, you can check it to determine how full your inhaler is. If your inhaler does not have a counter, ask your health care provider when you will need to refill your inhaler and write the refill date on a calendar or on your inhaler canister. Note that you cannot know when an inhaler is empty by shaking it.  Follow directions on the package insert for care and cleaning of your inhaler and spacer. Contact a health care provider if:  Symptoms are only partially relieved with your inhaler.  You are having trouble using your inhaler.  You have an increase in phlegm.  You have headaches. Get help right away if:  You feel little or no relief after using your inhaler.  You have dizziness.  You have a fast heart rate.  You have chills or a fever.  You have night sweats.  There is blood in your phlegm. Summary  A metered dose inhaler is a handheld device for taking medicine that must be breathed into the  lungs (inhaled).  The medicine is delivered by pushing down on a metal canister to release a preset amount of spray and medicine.  Each device contains the amount of medicine that is needed for a preset number of uses (inhalations). This information is not intended to replace advice given to you by your health care provider. Make sure you discuss any questions you have with your health care provider. Document Revised: 09/11/2017 Document Reviewed: 08/19/2016 Elsevier Patient Education  2020 [employer].

## 2019-11-08 NOTE — Progress Notes (Signed)
Patient ID: [name], female    DOB: 31-May-1996, 24 y.o.   MRN: [id number]  PCP: Sherri Passy, MD  No chief complaint on file.   Subjective:  HPI  Sherri Harris is a 24 y.o. female presents to [facility] clinic for evaluation of symptoms related to cough, chest congestion, nasal discharge, wheezing, shortness of breath with lying down.  Patient tested negative for COVID-19 over 1 week ago. PCP has covered with antibiotics, prednisone, Proventil nasal spray with Flonase, and albuterol inhaler.  Patient reports using albuterol inhaler 4 times per day and still not achieving a half level of relief of shortness of breath.  She endorses feeling somewhat better after 2 days of antibiotic.  She is concerned of having chest tightness with breathing and is here for chest x-ray.  Last time febrile was approximately 2 days ago with T-max of 100.7 F. She is due to return back to school this week, however remains feeling poorly. Unknown is fever is still present as taking ibuprofen around the clock over the last few days.  Review of Systems Pertinent negatives listed in HPI  Patient Active Problem List   Diagnosis Date Noted  . Respiratory infection 11/07/2019  . Encounter for well woman exam with routine gynecological exam 07/17/2019  . Anxiety and depression 10/14/2018  . Palpitations 10/14/2018  . Anxiety 06/17/2017  . Elevated blood pressure reading 11/10/2016  . 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 HTN (hypertension) 08/22/2016  . Contraception management 07/23/2016      Prior to Admission medications   Medication Sig Start Date End Date Taking? Authorizing Provider  albuterol (VENTOLIN HFA) 108 (90 Base) MCG/ACT inhaler Inhale 2 puffs into the lungs every 6 (six) hours as needed for wheezing or shortness of breath. 11/07/19   Luetta Nutting, DO  azithromycin (ZITHROMAX) 250 MG tablet Take 2 tabs on day 1 followed by 1 tab on days 2-5 11/07/19   Luetta Nutting, DO   brompheniramine-pseudoephedrine-DM 30-2-10 MG/5ML syrup Take 5 mLs by mouth 3 (three) times daily as needed (Cough). 11/07/19   Luetta Nutting, DO  carvedilol (COREG) 12.5 MG tablet Take 1/2 tablet by mouth twice a day for 1 week then take 1 tablet by mouth twice a day. Make annual appt for future refills.Thank you 04/18/19   Ledora Bottcher, PA  escitalopram (LEXAPRO) 20 MG tablet Take 1 tablet (20 mg total) by mouth daily. 07/05/19   Sherri Passy, MD  fluticasone Asencion Islam) 50 MCG/ACT nasal spray Place 2 sprays into both nostrils daily. 09/12/19   Couture, Cortni S, PA-C  naltrexone (DEPADE) 50 MG tablet Take 1 tablet (50 mg total) by mouth daily. 07/05/19   Sherri Passy, MD  Norethindrone-Ethinyl Estradiol-Fe Biphas (LO LOESTRIN FE) 1 MG-10 MCG / 10 MCG tablet Take 1 tablet by mouth daily. 07/18/19   Sherri Passy, MD  predniSONE (DELTASONE) 10 MG tablet Take 6 tabs day 1, 5 tabs day 2, 4 tabs day 3, 3 tabs day 4, 2 tabs day 5, 1 tab day 6 11/03/19   [name], NP    Past Medical, Surgical Family and Social History reviewed and updated.    Objective:   Today's Vitals   11/08/19 1741  BP: 128/70  Pulse: 84  Temp: 98.7 F (37.1 C)  SpO2: 98%  Weight: 156 lb 0.6 oz (70.8 kg)  Height: 5\' 10"  (1.778 m)    Wt Readings from Last 3 Encounters:  11/07/19 155 lb (70.3 kg)  08/04/19 160 lb (72.6 kg)  07/18/19 158 lb 3.2 oz (71.8 kg)   Physical Exam Constitutional: Patient appears well-developed and well-nourished. No distress. HENT: Normocephalic, atraumatic, External right and left ear normal. Oropharynx is clear and moist. Mucosal edema and congestion present  Eyes: Conjunctivae and EOM are normal. PERRLA, no scleral icterus. Neck: Normal ROM. Neck supple. No JVD. No tracheal deviation. No thyromegaly. CVS: RRR, S1/S2 +, no murmurs, no gallops, no carotid bruit.  Pulmonary: Effort and breath sounds normal, no stridor, rhonchi, wheezes, rales.  Abdominal: Soft. BS +, no  distension, tenderness, rebound or guarding.  Musculoskeletal: Normal range of motion. No edema and no tenderness.  Neuro: Alert. Normal reflexes, muscle tone coordination. No cranial nerve deficit. Skin: Skin is warm and dry. No rash noted. Not diaphoretic. No erythema. No pallor. Psychiatric: Normal mood and affect. Behavior, judgment, thought content normal.    Assessment & Plan:  1. Cough Repeating  Novel Coronavirus, NAA ([facility]) PCP prescribed brompheniramine-pseudoephedrine-DM, anti-tussive therapy. Complete Azithromycin prescribed by PCP yesterday.  2. Encounter for screening for COVID-19 COVID 19 Test pending.  - DG Chest 2 View; to rule out PNA or acute lung changes   3. Shortness of breath 4. Congestion of nasal sinus PCP prescribed Doxycyline (completed) and Azithromycin started yesterday.  Both treatments initiated for treatment of acute respiratory infection. Adding Atrovent nasal spray for nasal congestion.   Meds ordered this encounter  Medications  . ipratropium (ATROVENT) 0.03 % nasal spray    Sig: Place 2 sprays into both nostrils 2 (two) times daily.    Dispense:  30 mL    Refill:  0    School note provided exempting from attendance for next 3 days.   -The patient was given clear instructions to go to ER or return to medical center if symptoms do not improve, worsen or new problems develop. The patient verbalized understanding.     Joaquin Courts, FNP-C [facility], PRN Provider  [facility]. [place], [place]  Clinic Phone: [phone] Clinic Fax: [phone] Clinic Hours: 5:30 pm -7:30 pm (Monday-Friday)

## 2019-11-09 ENCOUNTER — Ambulatory Visit (HOSPITAL_COMMUNITY)
Admission: RE | Admit: 2019-11-09 | Discharge: 2019-11-09 | Disposition: A | Discharge status: Home / Self Care | Payer: No Typology Code available for payment source | Source: Ambulatory Visit | Attending: Family Medicine | Admitting: Family Medicine

## 2019-11-09 ENCOUNTER — Other Ambulatory Visit: Payer: Self-pay

## 2019-11-09 DIAGNOSIS — R0789 Other chest pain: Secondary | ICD-10-CM | POA: Diagnosis not present

## 2019-11-09 DIAGNOSIS — Z1152 Encounter for screening for COVID-19: Secondary | ICD-10-CM | POA: Insufficient documentation

## 2019-11-10 LAB — NOVEL CORONAVIRUS, NAA: SARS-CoV-2, NAA: NOT DETECTED

## 2019-11-17 MED FILL — LO LOESTRIN FE 1-10 TABLET: 1 MG-10 MCG | 84 days supply | Qty: 84 | Fill #0

## 2019-11-29 ENCOUNTER — Encounter: Payer: Self-pay | Admitting: Internal Medicine

## 2019-11-29 ENCOUNTER — Ambulatory Visit: Payer: BC Managed Care – PPO | Admitting: Internal Medicine

## 2019-11-29 ENCOUNTER — Ambulatory Visit (INDEPENDENT_AMBULATORY_CARE_PROVIDER_SITE_OTHER): Payer: No Typology Code available for payment source | Admitting: Internal Medicine

## 2019-11-29 DIAGNOSIS — F419 Anxiety disorder, unspecified: Secondary | ICD-10-CM | POA: Diagnosis not present

## 2019-11-29 DIAGNOSIS — F329 Major depressive disorder, single episode, unspecified: Secondary | ICD-10-CM | POA: Diagnosis not present

## 2019-11-29 DIAGNOSIS — F1010 Alcohol abuse, uncomplicated: Secondary | ICD-10-CM

## 2019-11-29 NOTE — Patient Instructions (Signed)
Alcohol Use Disorder °Alcohol use disorder is when your drinking disrupts your daily life. When you have this condition, you drink too much alcohol and you cannot control your drinking. °Alcohol use disorder can cause serious problems with your physical health. It can affect your brain, heart, liver, pancreas, immune system, stomach, and intestines. Alcohol use disorder can increase your risk for certain cancers and cause problems with your mental health, such as depression, anxiety, psychosis, delirium, and dementia. People with this disorder risk hurting themselves and others. °What are the causes? °This condition is caused by drinking too much alcohol over time. It is not caused by drinking too much alcohol only one or two times. Some people with this condition drink alcohol to cope with or escape from negative life events. Others drink to relieve pain or symptoms of mental illness. °What increases the risk? °You are more likely to develop this condition if: °· You have a family history of alcohol use disorder. °· Your culture encourages drinking to the point of intoxication, or makes alcohol easy to get. °· You had a mood or conduct disorder in childhood. °· You have been a victim of abuse. °· You are an adolescent and: °? You have poor grades or difficulties in school. °? Your caregivers do not talk to you about saying no to alcohol, or supervise your activities. °? You are impulsive or you have trouble with self-control. °What are the signs or symptoms? °Symptoms of this condition include: °· Drinking more than you want to. °· Drinking for longer than you want to. °· Trying several times to drink less or to control your drinking. °· Spending a lot of time getting alcohol, drinking, or recovering from drinking. °· Craving alcohol. °· Having problems at work, at school, or at home due to drinking. °· Having problems in relationships due to drinking. °· Drinking when it is dangerous to drink, such as before  driving a car. °· Continuing to drink even though you know you might have a physical or mental problem related to drinking. °· Needing more and more alcohol to get the same effect you want from the alcohol (building up tolerance). °· Having symptoms of withdrawal when you stop drinking. Symptoms of withdrawal include: °? Fatigue. °? Nightmares. °? Trouble sleeping. °? Depression. °? Anxiety. °? Fever. °? Seizures. °? Severe confusion. °? Feeling or seeing things that are not there (hallucinations). °? Tremors. °? Rapid heart rate. °? Rapid breathing. °? High blood pressure. °· Drinking to avoid symptoms of withdrawal. °How is this diagnosed? °This condition is diagnosed with an assessment. Your health care provider may start the assessment by asking three or four questions about your drinking. °Your health care provider may perform a physical exam or do lab tests to see if you have physical problems resulting from alcohol use. She or he may refer you to a mental health professional for evaluation. °How is this treated? °Some people with alcohol use disorder are able to reduce their alcohol use to low-risk levels. Others need to completely quit drinking alcohol. When necessary, mental health professionals with specialized training in substance use treatment can help. Your health care provider can help you decide how severe your alcohol use disorder is and what type of treatment you need. The following forms of treatment are available: °· Detoxification. Detoxification involves quitting drinking and using prescription medicines within the first week to help lessen withdrawal symptoms. This treatment is important for people who have had withdrawal symptoms before and for heavy drinkers   who are likely to have withdrawal symptoms. Alcohol withdrawal can be dangerous, and in severe cases, it can cause death. Detoxification may be provided in a home, community, or primary care setting, or in a hospital or substance use  treatment facility. °· Counseling. This treatment is also called talk therapy. It is provided by substance use treatment counselors. A counselor can address the reasons you use alcohol and suggest ways to keep you from drinking again or to prevent problem drinking. The goals of talk therapy are to: °? Find healthy activities and ways for you to cope with stress. °? Identify and avoid the things that trigger your alcohol use. °? Help you learn how to handle cravings. °· Medicines. Medicines can help treat alcohol use disorder by: °? Decreasing alcohol cravings. °? Decreasing the positive feeling you have when you drink alcohol. °? Causing an uncomfortable physical reaction when you drink alcohol (aversion therapy). °· Support groups. Support groups are led by people who have quit drinking. They provide emotional support, advice, and guidance. °These forms of treatment are often combined. Some people with this condition benefit from a combination of treatments provided by specialized substance use treatment centers. °Follow these instructions at home: °· Take over-the-counter and prescription medicines only as told by your health care provider. °· Check with your health care provider before starting any new medicines. °· Ask friends and family members not to offer you alcohol. °· Avoid situations where alcohol is served, including gatherings where others are drinking alcohol. °· Create a plan for what to do when you are tempted to use alcohol. °· Find hobbies or activities that you enjoy that do not include alcohol. °· Keep all follow-up visits as told by your health care provider. This is important. °How is this prevented? °· If you drink, limit alcohol intake to no more than 1 drink a day for nonpregnant women and 2 drinks a day for men. One drink equals 12 oz of beer, 5 oz of wine, or 1½ oz of hard liquor. °· If you have a mental health condition, get treatment and support. °· Do not give alcohol to  adolescents. °· If you are an adolescent: °? Do not drink alcohol. °? Do not be afraid to say no if someone offers you alcohol. Speak up about why you do not want to drink. You can be a positive role model for your friends and set a good example for those around you by not drinking alcohol. °? If your friends drink, spend time with others who do not drink alcohol. Make new friends who do not use alcohol. °? Find healthy ways to manage stress and emotions, such as meditation or deep breathing, exercise, spending time in nature, listening to music, or talking with a trusted friend or family member. °Contact a health care provider if: °· You are not able to take your medicines as told. °· Your symptoms get worse. °· You return to drinking alcohol (relapse) and your symptoms get worse. °Get help right away if: °· You have thoughts about hurting yourself or others. °If you ever feel like you may hurt yourself or others, or have thoughts about taking your own life, get help right away. You can go to your nearest emergency department or call: °· Your local emergency services (911 in the [place] °· A suicide crisis helpline, such as the National Suicide Prevention Lifeline at 1-800-273-8255. This is open 24 hours a day. °Summary °· Alcohol use disorder is when your drinking disrupts your daily   life. When you have this condition, you drink too much alcohol and you cannot control your drinking. °· Treatment may include detoxification, counseling, medicine, and support groups. °· Ask friends and family members not to offer you alcohol. Avoid situations where alcohol is served. °· Get help right away if you have thoughts about hurting yourself or others. °This information is not intended to replace advice given to you by your health care provider. Make sure you discuss any questions you have with your health care provider. °Document Revised: 09/11/2017 Document Reviewed: 06/26/2016 °Elsevier Patient Education © 2020 [employer]vier  Inc. ° °

## 2019-11-29 NOTE — Progress Notes (Signed)
Virtual Visit via Video Note  I connected with Sherri Harris on 11/29/19 at  3:15 PM EST by a video enabled telemedicine application and verified that I am speaking with the correct person using two identifiers.  Location: Patient: Home Provider: Office   I discussed the limitations of evaluation and management by telemedicine and the availability of in person appointments. The patient expressed understanding and agreed to proceed.  History of Present Illness:  Pt wants to discuss her binge drinking disorder. She reports she has had a bottle of wine nightly for the last 4 days. She reports she was admitted June 2020 for alcohol problems. Since she has been discharged, she reports this is her 3rd binge. She is not sure what triggers her binges. She reports during these periods, she gets more anxious and depressed, has poor appetite, sleeps all the time, doesn't go to school or work. She reports her anxiety and depression are normally well controlled with the Escitalopram. She is not seeing a therapist and does not attend [place] meetings. She really feels like she needs someone to talk to outside of her family. She denies binge eating disorder or other eating disorders or recreational drug use. She denies SI/HI.   Past Medical History:  Diagnosis Date  . Anxiety   . Hypertension     Current Outpatient Medications  Medication Sig Dispense Refill  . albuterol (VENTOLIN HFA) 108 (90 Base) MCG/ACT inhaler Inhale 2 puffs into the lungs every 6 (six) hours as needed for wheezing or shortness of breath. 8 g 0  . azithromycin (ZITHROMAX) 250 MG tablet Take 2 tabs on day 1 followed by 1 tab on days 2-5 6 tablet 0  . brompheniramine-pseudoephedrine-DM 30-2-10 MG/5ML syrup Take 5 mLs by mouth 3 (three) times daily as needed (Cough). 120 mL 0  . carvedilol (COREG) 12.5 MG tablet Take 1/2 tablet by mouth twice a day for 1 week then take 1 tablet by mouth twice a day. Make annual appt for future  refills.Thank you 180 tablet 0  . escitalopram (LEXAPRO) 20 MG tablet Take 1 tablet (20 mg total) by mouth daily. 90 tablet 3  . ibuprofen (ADVIL) 200 MG tablet Take 200 mg by mouth every 6 (six) hours as needed for fever or moderate pain.    Marland Kitchen ipratropium (ATROVENT) 0.03 % nasal spray Place 2 sprays into both nostrils 2 (two) times daily. 30 mL 0  . naltrexone (DEPADE) 50 MG tablet Take 1 tablet (50 mg total) by mouth daily. 90 tablet 3  . Norethindrone-Ethinyl Estradiol-Fe Biphas (LO LOESTRIN FE) 1 MG-10 MCG / 10 MCG tablet Take 1 tablet by mouth daily. 3 Package 3  . predniSONE (DELTASONE) 10 MG tablet Take 6 tabs day 1, 5 tabs day 2, 4 tabs day 3, 3 tabs day 4, 2 tabs day 5, 1 tab day 6 21 tablet 0   No current facility-administered medications for this visit.    Allergies  Allergen Reactions  . Amoxicillin Rash    REACTION: rash  . Penicillins Rash    Family History  Problem Relation Age of Onset  . Hypertension Mother   . Thyroid disease Mother   . Hypertension Maternal Aunt   . Hypertension Maternal Uncle   . Heart disease Maternal Grandmother   . Cancer Maternal Grandmother        Breast  . Hypertension Father   . Cardiomyopathy Father     Social History   Socioeconomic History  . Marital status: Single  Spouse name: Not on file  . Number of children: Not on file  . Years of education: Not on file  . Highest education level: Not on file  Occupational History  . Not on file  Tobacco Use  . Smoking status: Never Smoker  . Smokeless tobacco: Never Used  Substance and Sexual Activity  . Alcohol use: Yes    Alcohol/week: 0.0 standard drinks    Comment: occasional  . Drug use: No  . Sexual activity: Yes    Birth control/protection: Pill  Other Topics Concern  . Not on file  Social History Narrative  . Not on file   Social Determinants of Health   Financial Resource Strain:   . Difficulty of Paying Living Expenses: Not on file  Food Insecurity:   .  Worried About Programme researcher, broadcasting/film/video in the Last Year: Not on file  . Ran Out of Food in the Last Year: Not on file  Transportation Needs:   . Lack of Transportation (Medical): Not on file  . Lack of Transportation (Non-Medical): Not on file  Physical Activity:   . Days of Exercise per Week: Not on file  . Minutes of Exercise per Session: Not on file  Stress:   . Feeling of Stress : Not on file  Social Connections:   . Frequency of Communication with Friends and Family: Not on file  . Frequency of Social Gatherings with Friends and Family: Not on file  . Attends Religious Services: Not on file  . Active Member of Clubs or Organizations: Not on file  . Attends Banker Meetings: Not on file  . Marital Status: Not on file  Intimate Partner Violence:   . Fear of Current or Ex-Partner: Not on file  . Emotionally Abused: Not on file  . Physically Abused: Not on file  . Sexually Abused: Not on file     Constitutional: Denies fever, malaise, fatigue, headache or abrupt weight changes.  Respiratory: Denies difficulty breathing, shortness of breath, cough or sputum production.   Cardiovascular: Denies chest pain, chest tightness, palpitations or swelling in the hands or feet.  Neurological: Denies dizziness, difficulty with memory, difficulty with speech or problems with balance and coordination.  Psych: Pt reports anxiety and depression. Denies SI/HI.  No other specific complaints in a complete review of systems (except as listed in HPI above).  Observations/Objective:   Wt Readings from Last 3 Encounters:  11/08/19 156 lb 0.6 oz (70.8 kg)  11/07/19 155 lb (70.3 kg)  08/04/19 160 lb (72.6 kg)    General: Appears her stated age, well developed, well nourished in NAD. SPulmonary/Chest: Normal effort. No respiratory distress.  Neurological: Alert and oriented.  Psychiatric: Anxious appearing. Almost tearful at times but engages appropriately, makes eye contact.  BMET     Component Value Date/Time   NA 134 (L) 08/04/2019 0943   NA 138 01/22/2018 1012   K 3.2 (L) 08/04/2019 0943   CL 98 08/04/2019 0943   CO2 23 08/04/2019 0943   GLUCOSE 117 (H) 08/04/2019 0943   BUN 12 08/04/2019 0943   BUN 9 01/22/2018 1012   CREATININE 0.75 08/04/2019 0943   CALCIUM 9.2 08/04/2019 0943   GFRNONAA >60 08/04/2019 0943   GFRAA >60 08/04/2019 0943    Lipid Panel     Component Value Date/Time   CHOL 190 07/18/2019 1535   TRIG 160.0 (H) 07/18/2019 1535   HDL 53.10 07/18/2019 1535   CHOLHDL 4 07/18/2019 1535   VLDL 32.0  07/18/2019 1535   LDLCALC 105 (H) 07/18/2019 1535    CBC    Component Value Date/Time   WBC 8.4 08/04/2019 0943   RBC 4.85 08/04/2019 0943   HGB 14.8 08/04/2019 0943   HGB 14.5 01/22/2018 1012   HCT 41.8 08/04/2019 0943   HCT 42.2 01/22/2018 1012   PLT 329 08/04/2019 0943   PLT 337 01/22/2018 1012   MCV 86.2 08/04/2019 0943   MCV 90 01/22/2018 1012   MCH 30.5 08/04/2019 0943   MCHC 35.4 08/04/2019 0943   RDW 11.9 08/04/2019 0943   RDW 12.4 01/22/2018 1012   LYMPHSABS 1.5 08/04/2019 0943   MONOABS 0.4 08/04/2019 0943   EOSABS 0.0 08/04/2019 0943   BASOSABS 0.1 08/04/2019 0943    Hgb A1C No results found for: HGBA1C     Assessment and Plan:  Anxiety, Depression and Binge Drinking Disorder:  Encouraged her to attend AA meetings- she is hestant because they are virtual and she really wants to attend in person Referral to psychology for therapy Continue Escitalopram as prescribed  Return precautions discussed  Follow Up Instructions:    I discussed the assessment and treatment plan with the patient. The patient was provided an opportunity to ask questions and all were answered. The patient agreed with the plan and demonstrated an understanding of the instructions.   The patient was advised to call back or seek an in-person evaluation if the symptoms worsen or if the condition fails to improve as anticipated.    Nicki Reaper, NP

## 2019-12-05 ENCOUNTER — Encounter: Payer: Self-pay | Admitting: Internal Medicine

## 2019-12-06 NOTE — Telephone Encounter (Signed)
Patient left a voicemail stating that she needs some medication sent in for eczema or psoriasis. See my chart message.Left message on voicemail for patient to call back. Please contact patient about getting establish with a new doctor.

## 2019-12-07 ENCOUNTER — Telehealth (HOSPITAL_COMMUNITY): Payer: Self-pay | Admitting: Psychiatry

## 2019-12-07 ENCOUNTER — Emergency Department (HOSPITAL_COMMUNITY)
Admission: EM | Admit: 2019-12-07 | Discharge: 2019-12-07 | Disposition: A | Discharge status: Home / Self Care | Payer: No Typology Code available for payment source | Attending: Emergency Medicine | Admitting: Emergency Medicine

## 2019-12-07 ENCOUNTER — Other Ambulatory Visit: Payer: Self-pay

## 2019-12-07 ENCOUNTER — Telehealth: Payer: Self-pay | Admitting: *Deleted

## 2019-12-07 ENCOUNTER — Encounter (HOSPITAL_COMMUNITY): Payer: Self-pay | Admitting: Emergency Medicine

## 2019-12-07 DIAGNOSIS — Z79899 Other long term (current) drug therapy: Secondary | ICD-10-CM | POA: Diagnosis not present

## 2019-12-07 DIAGNOSIS — I10 Essential (primary) hypertension: Secondary | ICD-10-CM | POA: Diagnosis not present

## 2019-12-07 DIAGNOSIS — F332 Major depressive disorder, recurrent severe without psychotic features: Secondary | ICD-10-CM | POA: Insufficient documentation

## 2019-12-07 DIAGNOSIS — F1010 Alcohol abuse, uncomplicated: Secondary | ICD-10-CM

## 2019-12-07 DIAGNOSIS — F329 Major depressive disorder, single episode, unspecified: Secondary | ICD-10-CM

## 2019-12-07 DIAGNOSIS — F32A Depression, unspecified: Secondary | ICD-10-CM

## 2019-12-07 DIAGNOSIS — F1020 Alcohol dependence, uncomplicated: Secondary | ICD-10-CM | POA: Insufficient documentation

## 2019-12-07 LAB — RAPID URINE DRUG SCREEN, HOSP PERFORMED
Amphetamines: NOT DETECTED
Barbiturates: NOT DETECTED
Benzodiazepines: NOT DETECTED
Cocaine: NOT DETECTED
Opiates: NOT DETECTED
Tetrahydrocannabinol: NOT DETECTED

## 2019-12-07 LAB — COMPREHENSIVE METABOLIC PANEL
ALT: 32 U/L (ref 0–44)
AST: 37 U/L (ref 15–41)
Albumin: 4.2 g/dL (ref 3.5–5.0)
Alkaline Phosphatase: 42 U/L (ref 38–126)
Anion gap: 13 (ref 5–15)
BUN: 7 mg/dL (ref 6–20)
CO2: 21 mmol/L — ABNORMAL LOW (ref 22–32)
Calcium: 8.8 mg/dL — ABNORMAL LOW (ref 8.9–10.3)
Chloride: 109 mmol/L (ref 98–111)
Creatinine, Ser: 0.85 mg/dL (ref 0.44–1.00)
GFR calc Af Amer: >60 mL/min (ref >60)
GFR calc non Af Amer: >60 mL/min (ref >60)
Glucose, Bld: 129 mg/dL — ABNORMAL HIGH (ref 70–99)
Potassium: 3.7 mmol/L (ref 3.5–5.1)
Sodium: 143 mmol/L (ref 135–145)
Total Bilirubin: 0.5 mg/dL (ref 0.3–1.2)
Total Protein: 7.5 g/dL (ref 6.5–8.1)

## 2019-12-07 LAB — CBC WITH DIFFERENTIAL/PLATELET
Abs Immature Granulocytes: 0.03 10*3/uL (ref 0.00–0.07)
Basophils Absolute: 0.1 10*3/uL (ref 0.0–0.1)
Basophils Relative: 2 %
Eosinophils Absolute: 0 10*3/uL (ref 0.0–0.5)
Eosinophils Relative: 0 %
HCT: 44.2 % (ref 36.0–46.0)
Hemoglobin: 15.1 g/dL — ABNORMAL HIGH (ref 12.0–15.0)
Immature Granulocytes: 1 %
Lymphocytes Relative: 31 %
Lymphs Abs: 1.9 10*3/uL (ref 0.7–4.0)
MCH: 31.9 pg (ref 26.0–34.0)
MCHC: 34.2 g/dL (ref 30.0–36.0)
MCV: 93.2 fL (ref 80.0–100.0)
Monocytes Absolute: 0.2 10*3/uL (ref 0.1–1.0)
Monocytes Relative: 3 %
Neutro Abs: 3.9 10*3/uL (ref 1.7–7.7)
Neutrophils Relative %: 63 %
Platelets: 282 10*3/uL (ref 150–400)
RBC: 4.74 MIL/uL (ref 3.87–5.11)
RDW: 13.3 % (ref 11.5–15.5)
WBC: 6.2 10*3/uL (ref 4.0–10.5)
nRBC: 0 % (ref 0.0–0.2)

## 2019-12-07 LAB — I-STAT BETA HCG BLOOD, ED (MC, WL, AP ONLY): I-stat hCG, quantitative: <5 m[IU]/mL (ref <5)

## 2019-12-07 LAB — ETHANOL: Alcohol, Ethyl (B): 311 mg/dL (ref <10)

## 2019-12-07 MED ORDER — LORAZEPAM 2 MG/ML IJ SOLN: 0.0000 mg | Freq: Two times a day (BID) | INTRAMUSCULAR | Status: DC

## 2019-12-07 MED ORDER — LORAZEPAM 1 MG PO TABS: 0.0000 mg | ORAL_TABLET | Freq: Two times a day (BID) | ORAL | Status: DC

## 2019-12-07 MED ORDER — LORAZEPAM 1 MG PO TABS
0.0000 mg | ORAL_TABLET | Freq: Four times a day (QID) | ORAL | Status: DC
  Administered 2019-12-07: 1 mg via ORAL
  Filled 2019-12-07: qty 1

## 2019-12-07 MED ORDER — CHLORDIAZEPOXIDE HCL 25 MG PO CAPS: ORAL_CAPSULE | ORAL | 0 refills | Status: DC

## 2019-12-07 MED ORDER — THIAMINE HCL 100 MG/ML IJ SOLN: 100.0000 mg | Freq: Every day | INTRAMUSCULAR | Status: DC

## 2019-12-07 MED ORDER — THIAMINE HCL 100 MG PO TABS: 100.0000 mg | ORAL_TABLET | Freq: Every day | ORAL | Status: DC

## 2019-12-07 MED ORDER — LORAZEPAM 2 MG/ML IJ SOLN: 0.0000 mg | Freq: Four times a day (QID) | INTRAMUSCULAR | Status: DC

## 2019-12-07 NOTE — Telephone Encounter (Signed)
pts mom (DPR signed)called to say pt is at [facility] ED now; [name] was not happy because they would not let her stay with pt; [name] said she will give them some time to call her with update but [name] is concerned that pt will not tell them everything or be upfront with what all is going on. [name] said if no call within reasonable time she will go into ED and ask to speak with supervisor. pts mom is glad she was able to get pt to ED and wants to make sure she gets the care she needs. FYI to Pamala Hurry NP.

## 2019-12-07 NOTE — ED Notes (Signed)
Pt at triage nurse's station stating that she can't stay here any longer. Informed patient that once her lab work is completed TTS consult will be taking place which couple couple hour time frame but we really want pt to stay here to speak with them as they are the ones to work out the best treatment plan for the patient to get her out of the deep dark place she is currently in right now.  Made Dr Clarene Duke aware. She is coming now to speak with patient. Called Tom with TTS to see how much longer for the TTS consult. He was going to contact [facility] and try to find out how long going to be.

## 2019-12-07 NOTE — ED Notes (Signed)
Date and time results received: 12/07/19 12:51 PM   Test: alcohol  Critical Value: 311  Name of Provider Notified: Frederick Peers MD  Orders Received? Or Actions Taken?: orders are present

## 2019-12-07 NOTE — Discharge Instructions (Addendum)
For your behavioral health needs, you are advised to follow up with an outpatient provider.  You are scheduled for an intake appointment with Sherri Harris, LCAS at the [facility] at [facility].  Your appointment is set for tomorrow, Thursday, December 08, 2019 at 8:30 am.  Please complete the registration paperwork provided for you by the staff at [facility] ED.  Take it with you to the appointment, along with a face mask and your health insurance card:       [facility]      [address]      [place], [place] [address]      [phone]

## 2019-12-07 NOTE — Telephone Encounter (Signed)
Noted. Agree with advice given.

## 2019-12-07 NOTE — ED Provider Notes (Signed)
[facility] DEPT Provider Note   CSN: [id number] Arrival date & time: 12/07/19  1103     History Chief Complaint  Patient presents with  . Suicidal    Sherri Harris is a 24 y.o. female.  24 year old female with past medical history including hypertension, anxiety/depression, alcohol abuse who presents with alcohol abuse and SI.  Patient reports a longstanding history of alcohol abuse, has tried naltrexone before but it does not help.  She has had worsening depression recently with stressors including school and recent relationship break-up.  She reports some SI, she states that she does not think that she would actually do anything and does not have a specific plan but recently has been scared that something may happen.  Her last drink was earlier today.  She has had shakes and alcohol withdrawal symptoms before but denies alcohol withdrawal seizure.  She does not follow with mental health specialist.  The history is provided by the patient.       Past Medical History:  Diagnosis Date  . Anxiety   . Hypertension     Patient Active Problem List   Diagnosis Date Noted  . Anxiety and depression 10/14/2018  . HTN (hypertension) 08/22/2016    Past Surgical History:  Procedure Laterality Date  . TYMPANOSTOMY TUBE PLACEMENT    . WISDOM TOOTH EXTRACTION       OB History   No obstetric history on file.     Family History  Problem Relation Age of Onset  . Hypertension Mother   . Thyroid disease Mother   . Hypertension Maternal Aunt   . Hypertension Maternal Uncle   . Heart disease Maternal Grandmother   . Cancer Maternal Grandmother        Breast  . Hypertension Father   . Cardiomyopathy Father     Social History   Tobacco Use  . Smoking status: Never Smoker  . Smokeless tobacco: Never Used  Substance Use Topics  . Alcohol use: Yes    Comment: bottle of wine/day   . Drug use: No    Home Medications Prior to Admission  medications   Medication Sig Start Date End Date Taking? Authorizing Provider  albuterol (VENTOLIN HFA) 108 (90 Base) MCG/ACT inhaler Inhale 2 puffs into the lungs every 6 (six) hours as needed for wheezing or shortness of breath. 11/07/19   Luetta Nutting, DO  carvedilol (COREG) 12.5 MG tablet Take 1/2 tablet by mouth twice a day for 1 week then take 1 tablet by mouth twice a day. Make annual appt for future refills.Thank you 04/18/19   Ledora Bottcher, PA  chlordiazePOXIDE (LIBRIUM) 25 MG capsule 50mg  PO TID x 1D, then 25-50mg  PO BID X 1D, then 25-50mg  PO QD X 1D 12/07/19   Avabella Wailes, Wenda Overland, MD  escitalopram (LEXAPRO) 20 MG tablet Take 1 tablet (20 mg total) by mouth daily. 07/05/19   Lucille Passy, MD  ibuprofen (ADVIL) 200 MG tablet Take 200 mg by mouth every 6 (six) hours as needed for fever or moderate pain.    [provider]  ipratropium (ATROVENT) 0.03 % nasal spray Place 2 sprays into both nostrils 2 (two) times daily. 11/08/19   Scot Jun, FNP  naltrexone (DEPADE) 50 MG tablet Take 1 tablet (50 mg total) by mouth daily. 07/05/19   Lucille Passy, MD  Norethindrone-Ethinyl Estradiol-Fe Biphas (LO LOESTRIN FE) 1 MG-10 MCG / 10 MCG tablet Take 1 tablet by mouth daily. 07/18/19   Deborra Medina,  Bryn Gulling, MD    Allergies    Amoxicillin and Penicillins  Review of Systems   Review of Systems All other systems reviewed and are negative except that which was mentioned in HPI  Physical Exam Updated Vital Signs BP (!) 139/99 (BP Location: Left Arm)   Pulse (!) 102   Temp 98.9 F (37.2 C) (Oral)   Resp 17   SpO2 100%   Physical Exam Vitals and nursing note reviewed.  Constitutional:      General: She is not in acute distress.    Appearance: Normal appearance. She is well-developed.  HENT:     Head: Normocephalic and atraumatic.  Eyes:     Conjunctiva/sclera: Conjunctivae normal.  Musculoskeletal:     Cervical back: Neck supple.  Skin:    General: Skin is warm and dry.    Neurological:     Mental Status: She is alert and oriented to person, place, and time.  Psychiatric:        Behavior: Behavior normal.        Thought Content: Thought content normal.     Comments: Good eye contact, polite, occasionally tearful, depressed mood     ED Results / Procedures / Treatments   Labs (all labs ordered are listed, but only abnormal results are displayed) Labs Reviewed  COMPREHENSIVE METABOLIC PANEL - Abnormal; Notable for the following components:      Result Value   CO2 21 (*)    Glucose, Bld 129 (*)    Calcium 8.8 (*)    All other components within normal limits  ETHANOL - Abnormal; Notable for the following components:   Alcohol, Ethyl (B) 311 (*)    All other components within normal limits  CBC WITH DIFFERENTIAL/PLATELET - Abnormal; Notable for the following components:   Hemoglobin 15.1 (*)    All other components within normal limits  SARS CORONAVIRUS 2 (TAT 6-24 HRS)  RAPID URINE DRUG SCREEN, HOSP PERFORMED  I-STAT BETA HCG BLOOD, ED ([facility], [facility], [facility] ONLY)    EKG None  Radiology No results found.  Procedures Procedures (including critical care time)  Medications Ordered in ED Medications  LORazepam (ATIVAN) injection 0-4 mg ( Intravenous See Alternative 12/07/19 1312)    Or  LORazepam (ATIVAN) tablet 0-4 mg (1 mg Oral Given 12/07/19 1312)  LORazepam (ATIVAN) injection 0-4 mg (has no administration in time range)    Or  LORazepam (ATIVAN) tablet 0-4 mg (has no administration in time range)  thiamine tablet 100 mg (has no administration in time range)    Or  thiamine (B-1) injection 100 mg (has no administration in time range)    ED Course  I have reviewed the triage vital signs and the nursing notes.  Pertinent labs & imaging results that were available during my care of the patient were reviewed by me and considered in my medical decision making (see chart for details).    MDM Rules/Calculators/A&P                      This is a  very pleasant 24 yo F who presents for help with worsening depression and alcohol abuse, states "I just don't know what to do anymore." Some SI without a plan but admits she is scared she may do something. Contacted TTS for eval. No signs of severe alcohol withdrawal currently. Gave PO ativan for anxiety.  Labs notable for BAL 311.  Patient evaluated by behavioral health and they have recommended discharge with outpatient  resources.  They have scheduled the patient for an outpatient psychiatry clinic visit tomorrow.  Patient feels comfortable for this plan.  She is pleasant, grateful, and well-appearing on reassessment. PRovided w/ librium taper. Return precautions reviewed. Final Clinical Impression(s) / ED Diagnoses Final diagnoses:  Alcohol abuse  Depression, unspecified depression type    Rx / DC Orders ED Discharge Orders         Ordered    chlordiazePOXIDE (LIBRIUM) 25 MG capsule     12/07/19 1348           Silver Achey, Ambrose Finland, MD 12/07/19 1715

## 2019-12-07 NOTE — ED Notes (Signed)
TTS machine at bedside. 

## 2019-12-07 NOTE — BH Assessment (Signed)
[facility] Assessment Progress Note  Per Shuvon Rankin, FNP, this pt does not require psychiatric hospitalization at this time.  Pt is to be discharged from [facility] with outpatient referrals.  Shuvon reports that she has contacted the [facility] at [facility], and has scheduled the pt for an intake appointment with [name], LCAS tomorrow, 12/06/2019 at 08:30.  This has been included in pt's discharge instructions.  Pt has also been provided with an intake paperwork packet which she has been asked to fill out and to take with her to the appointment, along with a face mask and her health insurance card.  Pt's nurse has been notified.  Doylene Canning, MA Triage Specialist [phone]

## 2019-12-07 NOTE — Consult Note (Signed)
  Sherri Harris, 24 y.o., female patient seen via tele psych by this provider, Dr. Lucianne Muss; and chart reviewed on 12/07/19.  On evaluation Sherri Harris reports depress and alcohol use disorder, seeking outpatient psychiatric services.   During evaluation Sherri Harris is alert/oriented x 4; calm/cooperative; and mood is congruent with affect.  He/She does not appear to be responding to internal/external stimuli or delusional thoughts.  Patient denies suicidal/self-harm/homicidal ideation, psychosis, and paranoia.  Patient answered question appropriately.     For detailed note see TTS Assessment Note   Disposition:  Psychiatrically cleared No evidence of imminent risk to self or others at present.   Patient does not meet criteria for psychiatric inpatient admission. Supportive therapy provided about ongoing stressors. Refer to IOP. Discussed crisis plan, support from social network, calling 911, coming to the Emergency Department, and calling Suicide Hotline.     Discharge Instructions     For your behavioral health needs, you are advised to follow up with an outpatient provider.  You are scheduled for an intake appointment with Sherri Harris, LCAS at the [facility] at [facility].  Your appointment is set for tomorrow, Thursday, December 08, 2019 at 8:30 am.  Please complete the registration paperwork provided for you by the staff at [facility] ED.  Take it with you to the appointment, along with a face mask and your health insurance card:       [facility]      [address]      [place], [place] [address]      [phone]

## 2019-12-07 NOTE — Telephone Encounter (Addendum)
Patient's mom called stating that she is really concerned about her daughter. [name] stated that her daughter is sleeping all the time and failing school now. [name] stated that Sherri Harris is real depressed and she is afraid that she may hurt herself. [name] stated that she thinks that she went out drinking last night and not sure what else she did. [name] stated that she feels that [name] needs to be admitted to [facility]. Patient's mom stated that she is with Juliet now, but she is asleep. Advised patient's mom that she needs to take her to [facility] to be evaluated.  [name] stated that she will try to get [name] up and get her to the ER. [name] stated that she will call the office back and let us know if she can get her to go to the ER.

## 2019-12-07 NOTE — ED Triage Notes (Signed)
Pt comes in tearful with mother. Patient states that she has been drinking a bottle of wine per day. Reports that she has been having thoughts of SI that have been pretty consist over past couple weeks, but denies a specific plan. Reports she has issues with anxiety and depression for years and takes medications. Reports that she having school issues and relationship issues.

## 2019-12-07 NOTE — BH Assessment (Signed)
Assessment Note  Sherri Harris is an 24 y.o. female presenting voluntarily to [facility] ED with chief complaint of alcohol addiction and suicidal ideation. Patient is accompanied by her mother, Sherri Harris, who is present for assessment at request of patient. Patient reports she has struggled with alcohol abuse since she was 89. Patient went to 30 day program at [facility] in June but only remained sober for 1 month. Patient states she has been on a binge for 2 weeks- drinking 1 bottle of wine per day. Patient reports passive suicidal ideation without intent or plan. She denies any prior attempts or self harming behavior. She denies HI/AVH or any other substance abuse. Patient reports she on 20 mg Lexapro prescribed by her PCP.  Per patient's mother, Sherri Harris: Patient's alcohol addiction has led her to do things out of character such as lie, hide alcohol bottles around the house, and stealing alcohol from the store when she does not have any money to buy it. Patient is increasingly depressed, sleeps all day, and isolates. She states patient's twin sister moved away, which has been hard on patient.   Patient is alert and oriented x 4. She is dressed appropriately. Speech is logical, eye contact is good, and thoughts are organized. Her mood is anxious/depressed and her affect is congruent. She has fair insight, judgement, and impulse control. She does not appear to be responding to internal stimuli or experiencing delusional thought content  Diagnosis: F10.20 Alcohol use disorder, severe   F33.2 MDD, recurrent, severe  Past Medical History:  Past Medical History:  Diagnosis Date  . Anxiety   . Hypertension     Past Surgical History:  Procedure Laterality Date  . TYMPANOSTOMY TUBE PLACEMENT    . WISDOM TOOTH EXTRACTION      Family History:  Family History  Problem Relation Age of Onset  . Hypertension Mother   . Thyroid disease Mother   . Hypertension Maternal Aunt   . Hypertension Maternal Uncle    . Heart disease Maternal Grandmother   . Cancer Maternal Grandmother        Breast  . Hypertension Father   . Cardiomyopathy Father     Social History:  reports that she has never smoked. She has never used smokeless tobacco. She reports current alcohol use. She reports that she does not use drugs.  Additional Social History:  Alcohol / Drug Use Pain Medications: see MAR Prescriptions: see MAR Over the Counter: see MAR History of alcohol / drug use?: Yes Longest period of sobriety (when/how long): 1 month Substance #1 Name of Substance 1: Alcohol 1 - Age of First Use: 15 1 - Amount (size/oz): 1 bottle of wine 1 - Frequency: daily 1 - Duration: 5 months 1 - Last Use / Amount: 2/24  CIWA: CIWA-Ar BP: (!) 139/99 Pulse Rate: (!) 102 Nausea and Vomiting: no nausea and no vomiting Tactile Disturbances: none Tremor: no tremor Auditory Disturbances: not present Paroxysmal Sweats: no sweat visible Visual Disturbances: not present Anxiety: six(Pt states " This is the most anxious I have ever been") Headache, Fullness in Head: none present Agitation: somewhat more than normal activity Orientation and Clouding of Sensorium: oriented and can do serial additions CIWA-Ar Total: 7 COWS:    Allergies:  Allergies  Allergen Reactions  . Amoxicillin Rash    REACTION: rash  . Penicillins Rash    Home Medications: (Not in a hospital admission)   OB/GYN Status:  No LMP recorded. (Menstrual status: Oral contraceptives).  General Assessment Data  Location of Assessment: [facility] ED TTS Assessment: In system Is this a Tele or Face-to-Face Assessment?: Face-to-Face Is this an Initial Assessment or a Re-assessment for this encounter?: Initial Assessment Patient Accompanied by:: Parent Language Other than English: No Living Arrangements: (mother's home) What gender do you identify as?: Female Marital status: Single Maiden name: Bachtell Pregnancy Status: No Living Arrangements: Parent Can  pt return to current living arrangement?: Yes Admission Status: Voluntary Is patient capable of signing voluntary admission?: Yes Referral Source: Self/Family/Friend Insurance type: BCBS     Crisis Care Plan Living Arrangements: Parent Legal Guardian: (self) Name of Psychiatrist: none Name of Therapist: none  Education Status Is patient currently in school?: Yes Current Grade: college Highest grade of school patient has completed: 12 Name of school: [employer] person: NA IEP information if applicable: NA  Risk to self with the past 6 months Suicidal Ideation: Yes-Currently Present Has patient been a risk to self within the past 6 months prior to admission? : No Suicidal Intent: No Has patient had any suicidal intent within the past 6 months prior to admission? : No Is patient at risk for suicide?: No Suicidal Plan?: No Has patient had any suicidal plan within the past 6 months prior to admission? : No Access to Means: No What has been your use of drugs/alcohol within the last 12 months?: daily alcohol use Previous Attempts/Gestures: No How many times?: 0 Other Self Harm Risks: none Triggers for Past Attempts: None known Intentional Self Injurious Behavior: None Family Suicide History: No Recent stressful life event(s): (alcohol addiction) Persecutory voices/beliefs?: No Depression: Yes Depression Symptoms: Despondent, Tearfulness, Isolating, Fatigue, Guilt, Loss of interest in usual pleasures, Feeling worthless/self pity, Feeling angry/irritable, Insomnia Substance abuse history and/or treatment for substance abuse?: No Suicide prevention information given to non-admitted patients: Not applicable  Risk to Others within the past 6 months Homicidal Ideation: No Does patient have any lifetime risk of violence toward others beyond the six months prior to admission? : No Thoughts of Harm to Others: No Current Homicidal Intent: No Current Homicidal Plan: No Access to  Homicidal Means: No Identified Victim: none History of harm to others?: No Assessment of Violence: None Noted Violent Behavior Description: none Does patient have access to weapons?: No Criminal Charges Pending?: No Does patient have a court date: No Is patient on probation?: No  Psychosis Hallucinations: None noted Delusions: None noted  Mental Status Report Appearance/Hygiene: Unremarkable Eye Contact: Good Motor Activity: Freedom of movement Speech: Logical/coherent Level of Consciousness: Alert Mood: Depressed, Anxious Affect: Anxious, Depressed Anxiety Level: Moderate Thought Processes: Coherent, Relevant Judgement: Impaired Orientation: Person, Place, Time, Situation Obsessive Compulsive Thoughts/Behaviors: None  Cognitive Functioning Concentration: Normal Memory: Recent Intact, Remote Intact Is patient IDD: No Insight: Fair Impulse Control: Fair Appetite: Good Have you had any weight changes? : No Change Sleep: Increased Total Hours of Sleep: (sleeps "all day") Vegetative Symptoms: Staying in bed  ADLScreening [facility] Assessment Services) Patient's cognitive ability adequate to safely complete daily activities?: Yes Patient able to express need for assistance with ADLs?: Yes Independently performs ADLs?: Yes (appropriate for developmental age)  Prior Inpatient Therapy Prior Inpatient Therapy: Yes Prior Therapy Dates: 03/2019 Prior Therapy Facilty/Provider(s): [facility] Reason for Treatment: alcohol abuse  Prior Outpatient Therapy Prior Outpatient Therapy: No Does patient have an ACCT team?: No Does patient have Intensive In-House Services?  : No Does patient have [facility] services? : No Does patient have P4CC services?: No  ADL Screening (condition at time of admission) Patient's cognitive  ability adequate to safely complete daily activities?: Yes Is the patient deaf or have difficulty hearing?: No Does the patient have difficulty seeing, even when  wearing glasses/contacts?: No Does the patient have difficulty concentrating, remembering, or making decisions?: No Patient able to express need for assistance with ADLs?: Yes Does the patient have difficulty dressing or bathing?: No Independently performs ADLs?: Yes (appropriate for developmental age) Does the patient have difficulty walking or climbing stairs?: No Weakness of Legs: None Weakness of Arms/Hands: None  Home Assistive Devices/Equipment Home Assistive Devices/Equipment: None  Therapy Consults (therapy consults require a physician order) PT Evaluation Needed: No OT Evalulation Needed: No SLP Evaluation Needed: No       Advance Directives (For Healthcare) Does Patient Have a Medical Advance Directive?: No Would patient like information on creating a medical advance directive?: No - Patient declined          Disposition: Per Earleen Newport, NP and Dr. Dwyane Dee patient does not meet in patient criteria. Patient referred to [facility]. Disposition Initial Assessment Completed for this Encounter: Yes Patient referred to: Outpatient clinic referral  On Site Evaluation by:   Reviewed with Physician:    Orvis Brill 12/07/2019 4:29 PM

## 2019-12-08 ENCOUNTER — Ambulatory Visit (INDEPENDENT_AMBULATORY_CARE_PROVIDER_SITE_OTHER): Payer: No Typology Code available for payment source | Admitting: Licensed Clinical Social Worker

## 2019-12-08 DIAGNOSIS — F329 Major depressive disorder, single episode, unspecified: Secondary | ICD-10-CM | POA: Diagnosis not present

## 2019-12-08 DIAGNOSIS — F331 Major depressive disorder, recurrent, moderate: Secondary | ICD-10-CM | POA: Diagnosis not present

## 2019-12-08 DIAGNOSIS — F32A Depression, unspecified: Secondary | ICD-10-CM

## 2019-12-08 DIAGNOSIS — F419 Anxiety disorder, unspecified: Secondary | ICD-10-CM

## 2019-12-08 DIAGNOSIS — F1010 Alcohol abuse, uncomplicated: Secondary | ICD-10-CM | POA: Diagnosis not present
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
## 2019-12-12 ENCOUNTER — Other Ambulatory Visit (HOSPITAL_COMMUNITY)
Payer: No Typology Code available for payment source | Attending: Psychiatry | Admitting: Licensed Clinical Social Worker

## 2019-12-12 ENCOUNTER — Other Ambulatory Visit: Payer: Self-pay

## 2019-12-12 DIAGNOSIS — F419 Anxiety disorder, unspecified: Secondary | ICD-10-CM | POA: Insufficient documentation

## 2019-12-12 DIAGNOSIS — F1010 Alcohol abuse, uncomplicated: Secondary | ICD-10-CM

## 2019-12-12 DIAGNOSIS — F329 Major depressive disorder, single episode, unspecified: Secondary | ICD-10-CM | POA: Insufficient documentation

## 2019-12-12 DIAGNOSIS — F32A Depression, unspecified: Secondary | ICD-10-CM

## 2019-12-13 ENCOUNTER — Telehealth (HOSPITAL_COMMUNITY): Payer: Self-pay | Admitting: Licensed Clinical Social Worker

## 2019-12-13 ENCOUNTER — Other Ambulatory Visit: Payer: Self-pay | Admitting: Behavioral Health

## 2019-12-13 ENCOUNTER — Ambulatory Visit (HOSPITAL_COMMUNITY)
Admission: RE | Admit: 2019-12-13 | Discharge: 2019-12-13 | Disposition: A | Discharge status: Home / Self Care | Payer: No Typology Code available for payment source | Attending: Psychiatry | Admitting: Psychiatry

## 2019-12-13 DIAGNOSIS — F1020 Alcohol dependence, uncomplicated: Secondary | ICD-10-CM | POA: Insufficient documentation

## 2019-12-13 DIAGNOSIS — F419 Anxiety disorder, unspecified: Secondary | ICD-10-CM | POA: Diagnosis not present

## 2019-12-13 DIAGNOSIS — R45851 Suicidal ideations: Secondary | ICD-10-CM | POA: Diagnosis not present

## 2019-12-13 DIAGNOSIS — Z79899 Other long term (current) drug therapy: Secondary | ICD-10-CM | POA: Diagnosis not present

## 2019-12-13 DIAGNOSIS — Z1389 Encounter for screening for other disorder: Secondary | ICD-10-CM | POA: Insufficient documentation

## 2019-12-13 DIAGNOSIS — F329 Major depressive disorder, single episode, unspecified: Secondary | ICD-10-CM | POA: Insufficient documentation

## 2019-12-13 NOTE — H&P (Signed)
Behavioral Health Medical Screening Exam  Sherri Harris is an 24 y.o. female.who presented to [facility] as a walk-in, voluntarily, accompanied by her mother. Patient was evaluated by psychiatry on 12/07/2019 after she presented to [facility] ED with chief complaint of alcohol addiction and suicidal ideation. Patient presented today with same complaints of alcohol dependency although she denied current suicidal thoughts. She stated that her suicidal thoughts are intermittent and her last thought was last night/ She endorsed depression and anxiety which seems to be secondary to her alcohol addiction. Patient mother stated she is at a point where she does not know what to do do in relation to patients alcohol addiction. Reported that patient is now stealing wine, lying, and continues to hide bottles around the house. She stated that patient is depressed, sleeps most of the day and isolates herself.. During patients last evaluation by psychiatry, patient was referred to [facility] CDIOP. She stated she participated in the program once, yesterday, and she felt like it was not a good fit. Stated after her participation, she was so stressed that she went and drank alcohol. Stated that yesterday she drank a paint of Vodka. Reported that her alcohol intake occurs daily. She stated that her most recent rehab was at [facility], 30 day program and per chart review, this was last June. Stated that she is taking citalopram for depression and anxiety prescribed by her PCP.   Total Time spent with patient: 20 minutes  Psychiatric Specialty Exam: Physical Exam  Vitals reviewed. Constitutional: She is oriented to person, place, and time.  Neurological: She is alert and oriented to person, place, and time.    Review of Systems  Psychiatric/Behavioral:       Depression , anxiety     Blood pressure (!) 140/106, pulse 98, temperature 97.9 F (36.6 C), resp. rate 18, SpO2 97 %.There is no height or weight on file to calculate BMI.   General Appearance: Fairly Groomed  Eye Contact:  Good  Speech:  Clear and Coherent and Normal Rate  Volume:  Normal  Mood:  Anxious and Depressed  Affect:  Appropriate  Thought Process:  Coherent, Linear and Descriptions of Associations: Intact  Orientation:  Full (Time, Place, and Person)  Thought Content:  Logical  Suicidal Thoughts:  No  Homicidal Thoughts:  No  Memory:  Immediate;   Good Recent;   Fair  Judgement:  Impaired  Insight:  Shallow  Psychomotor Activity:  Normal  Concentration: Concentration: Fair and Attention Span: Fair  Recall:  [employer] of Knowledge:Fair  Language: Good  Akathisia:  Negative  Handed:  Right  AIMS (if indicated):     Assets:  Communication Skills Desire for Improvement Resilience  Sleep:       Musculoskeletal: Strength & Muscle Tone: within normal limits Gait & Station: normal Patient leans: N/A  Blood pressure (!) 140/106, pulse 98, temperature 97.9 F (36.6 C), resp. rate 18, SpO2 97 %.  Recommendations:  Based on my evaluation the patient does not appear to have an emergency medical condition.   Patient denies current SI, HI or AVH. She does not meet criteria for psychiatric inpatient admission. Due to patients alcohol dependency, we did discuss the need for long-term inpatient substance abuse treatment which both patient and mother were receptive. A list of resources were provided and patient was highly encouraged to follow-up with resources.     Denzil Magnuson, NP 12/13/2019, 11:41 AM

## 2019-12-13 NOTE — Telephone Encounter (Signed)
Client mother contacted primary clinician stating client left group from previous day and went to the liquor store and she was somewhat suicidal this day. Clinician notified client mom of need for detox in addition to inpatient addressing of SI which could be received at [facility]. Mother had concerns about [facility] insurance and attending a different hospital. Mother will ask assessment team at [facility]. Clinician reminded mother of initial recommendation for client to attend inpatient detox prior to starting cdiop program rather than complete librium taper at home due to similar concerns. Mother will follow up with clinician.

## 2019-12-13 NOTE — BH Assessment (Signed)
Assessment Note  Sherri Harris is an 24 y.o. female presenting voluntarily to [facility] for assessment. Patient is accompanied by her mother, [name], who is present for assessment at request of patient. Patient reports ongoing alcohol addiction and passive SI. Patient assessed last week at [facility] ED and referred to CDIOP. She states she went to 1 group and left because she did not find it helpful. She reports drinking 1 bottle of wine per day or a pint of vodka. She reports passive SI without intent or plan. She denies HI/AVH. Patient denies any other substance use than alcohol. Patient's mother expressed concern that patient is stealing alcohol from stores, leaving in the middle of the night, and talking about "not wanting to be alive anymore."  Patient is alert and oriented x 4. She is dressed appropriately. Her speech is logical, eye contact is good, and thoughts are organized. Her mood is anxious/depressed and her affect is congruent. She has fair insight, judgement, and impulse control. She does not appear to be responding to internal stimuli or experiencing delusional thought content.  Diagnosis: F10.20 Alcohol use disorder, severe  Past Medical History:  Past Medical History:  Diagnosis Date  . Anxiety   . Hypertension     Past Surgical History:  Procedure Laterality Date  . TYMPANOSTOMY TUBE PLACEMENT    . WISDOM TOOTH EXTRACTION      Family History:  Family History  Problem Relation Age of Onset  . Hypertension Mother   . Thyroid disease Mother   . Hypertension Maternal Aunt   . Hypertension Maternal Uncle   . Heart disease Maternal Grandmother   . Cancer Maternal Grandmother        Breast  . Hypertension Father   . Cardiomyopathy Father     Social History:  reports that she has never smoked. She has never used smokeless tobacco. She reports current alcohol use. She reports that she does not use drugs.  Additional Social History:  Alcohol / Drug Use Pain Medications: when  wisom teeth pulled; mom gave away due to exhusband Prescriptions: hx xanax from mom, last use 1 year ago History of alcohol / drug use?: Yes Longest period of sobriety (when/how long): rehab 1 month and 3 weeks following Negative Consequences of Use: Work / Programmer, multimedia, Copywriter, advertising relationships Withdrawal Symptoms: Sweats, Nausea / Vomiting, Blackouts, Patient aware of relationship between substance abuse and physical/medical complications(multiple blackouts) Substance #1 Name of Substance 1: alcohol 1 - Age of First Use: 14 1 - Amount (size/oz): one bottole of wine (previously 1 bottle of vodka or tequila) 1 - Frequency: daily 1 - Duration: 16 (regular drinking/craving not binge) binging age 59 (4 years) 1 - Last Use / Amount: 12/07/19- 2 wine cools/1/2 bottles  CIWA: CIWA-Ar BP: (!) 140/106 Pulse Rate: 98 COWS:    Allergies:  Allergies  Allergen Reactions  . Amoxicillin Rash    REACTION: rash  . Penicillins Rash    Home Medications: (Not in a hospital admission)   OB/GYN Status:  No LMP recorded. (Menstrual status: Oral contraceptives).  General Assessment Data Location of Assessment: [facility] Assessment Services TTS Assessment: In system Is this a Tele or Face-to-Face Assessment?: Face-to-Face Is this an Initial Assessment or a Re-assessment for this encounter?: Initial Assessment Patient Accompanied by:: Parent Language Other than English: No Living Arrangements: (mother's home) What gender do you identify as?: Female Marital status: Single Pregnancy Status: No Living Arrangements: Parent Can pt return to current living arrangement?: Yes Admission Status: Voluntary Is patient  capable of signing voluntary admission?: Yes Referral Source: Self/Family/Friend Insurance type: BCBS/ [facility]     Crisis Care Plan Living Arrangements: Parent Legal Guardian: (self) Name of Psychiatrist: none Name of Therapist: none  Education Status Is patient currently in school?:  Yes Current Grade: college Highest grade of school patient has completed: 19 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 : No Suicidal Intent: No Has patient had any suicidal intent within the past 6 months prior to admission? : No Is patient at risk for suicide?: No Suicidal Plan?: No Has patient had any suicidal plan within the past 6 months prior to admission? : No Access to Means: No What has been your use of drugs/alcohol within the last 12 months?: daily alcohol use Previous Attempts/Gestures: No How many times?: 0 Other Self Harm Risks: none Triggers for Past Attempts: None known Intentional Self Injurious Behavior: None Family Suicide History: No Recent stressful life event(s): Other (Comment)(alcohol addiction) Persecutory voices/beliefs?: No Depression: Yes Depression Symptoms: Despondent, Insomnia, Tearfulness, Fatigue, Isolating, Guilt, Loss of interest in usual pleasures, Feeling worthless/self pity, Feeling angry/irritable Substance abuse history and/or treatment for substance abuse?: No Suicide prevention information given to non-admitted patients: Not applicable  Risk to Others within the past 6 months Homicidal Ideation: No Does patient have any lifetime risk of violence toward others beyond the six months prior to admission? : No Thoughts of Harm to Others: No Current Homicidal Intent: No Current Homicidal Plan: No Access to Homicidal Means: No Identified Victim: none History of harm to others?: No Assessment of Violence: None Noted Violent Behavior Description: none Does patient have access to weapons?: No Criminal Charges Pending?: No Does patient have a court date: No Is patient on probation?: No  Psychosis Hallucinations: None noted Delusions: None  noted  Mental Status Report Appearance/Hygiene: Unremarkable Eye Contact: Good Motor Activity: Freedom of movement Speech: Logical/coherent Level of Consciousness: Alert Mood: Depressed, Anxious Affect: Anxious, Depressed Anxiety Level: Moderate Thought Processes: Coherent, Relevant Judgement: Impaired Orientation: Person, Place, Time, Situation Obsessive Compulsive Thoughts/Behaviors: None  Cognitive Functioning Concentration: Normal Memory: Recent Intact, Remote Intact Is patient IDD: No Insight: Fair Impulse Control: Fair Appetite: Good Have you had any weight changes? : No Change Sleep: Increased Total Hours of Sleep: ("all day") Vegetative Symptoms: Staying in bed  ADLScreening [facility] Assessment Services) Patient's cognitive ability adequate to safely complete daily activities?: Yes Patient able to express need for assistance with ADLs?: Yes Independently performs ADLs?: Yes (appropriate for developmental age)  Prior Inpatient Therapy Prior Inpatient Therapy: Yes Prior Therapy Dates: 03/2019 [facility]Provider[facility] Reason for Treatment: alcohol abuse  Prior Outpatient Therapy Prior Outpatient Therapy: Yes Prior Therapy Dates: 11/2019 Prior Therapy Facilty/Provider(s): [facility] Reason for Treatment: alcohol abuse Does patient have an ACCT team?: No Does patient have Intensive In-House Services?  : No Does patient have [facility] services? : No Does patient have P4CC services?: No  ADL Screening (condition at time of admission) Patient's cognitive ability adequate to safely complete daily activities?: Yes Is the patient deaf or have difficulty hearing?: No Does the patient have difficulty seeing, even when wearing glasses/contacts?: No Does the patient have difficulty concentrating, remembering, or making decisions?: No Patient able to express need for assistance with ADLs?: Yes Does the patient have difficulty dressing or  bathing?:  No Independently performs ADLs?: Yes (appropriate for developmental age) Does the patient have difficulty walking or climbing stairs?: No Weakness of Legs: None Weakness of Arms/Hands: None  Home Assistive Devices/Equipment Home Assistive Devices/Equipment: None  Therapy Consults (therapy consults require a physician order) PT Evaluation Needed: No OT Evalulation Needed: No SLP Evaluation Needed: No Abuse/Neglect Assessment (Assessment to be complete while patient is alone) Abuse/Neglect Assessment Can Be Completed: Yes Physical Abuse: Denies Verbal Abuse: Denies Sexual Abuse: Denies Exploitation of patient/patient's resources: Denies Self-Neglect: Denies Values / Beliefs Cultural Requests During Hospitalization: None Spiritual Requests During Hospitalization: None Consults Spiritual Care Consult Needed: No Transition of Care Team Consult Needed: No Advance Directives (For Healthcare) Does Patient Have a Medical Advance Directive?: No Would patient like information on creating a medical advance directive?: No - Patient declined          Disposition: Per Mordecai Maes, NP patient does not meet in patient criteria. She recommends patient follow up with CDIOP or inpatient SA treatment. Patient provided with resources for residential substance use treatment.  Disposition Initial Assessment Completed for this Encounter: Yes Disposition of Patient: Discharge Patient refused recommended treatment: No Mode of transportation if patient is discharged/movement?: Car  On Site Evaluation by:   Reviewed with Physician:    Orvis Brill 12/13/2019 12:09 PM

## 2019-12-13 NOTE — Progress Notes (Signed)
    Daily Group Progress Note  Program: CD-IOP   The purpose of this group is to utilize CBT and DBT skills to increase use of healthy coping skills to eliminate substance use and support recovery.  Psycho-educational session co-facilitated by [facility] focused on use of yoga to promote the use of mindfulness as a skill for recovery. Clinician and group members participated in session skills presented. Clinician praised client use of engagement in activity and challenged clients to use this skill and body awareness to help monitor symptoms of stress. Clinician and group members reviewed and discussed crisis planning, including identifying triggers, distraction sills, support systems and resources. UDS completed on all clients.  Process session focused on recent stressors and barriers to recovery. Clinician checked in with group members, assessing for SI/HI/psychosis and overall level of functioning including relapse and cravings. Clinician and group members discussed challenges and successes with family dynamics, engagement with community support systems, and skills used to address interactions, including assertive communication or distraction skills. Clinician challenged group members to continue enforcing boundaries despite level of comfort or discomfort. Clinician inquired self care activity to complete prior to next group session.   Summary: Client presents for first group of CDIOP following assessment. Client did not complete inpatient detox as recommended instead worked with mom on librium taper however drank a bottle of wine over the weekend. Clinician reviewed the importance of maintaining sobriety for health reasons in addition to being mandatory for CDIOP program. Client discussed with clinician any days needing to be missed due to school/internship. Client reports sobriety date of 12/11/19. Client engaged appropriately in discussions and activities, providing reason for seeking help and recent  triggers including a break up.   Family Program: Family present? No   Name of family member(s): NA; will follow up with mother.  UDS collected: Yes Results: pending; reported would possibly be positive from alcohol use over the weekend AA/NA attended?: No; first group  Sponsor?: No   Harlon Ditty, LCSW

## 2019-12-14 ENCOUNTER — Other Ambulatory Visit: Payer: Self-pay

## 2019-12-14 ENCOUNTER — Other Ambulatory Visit (HOSPITAL_COMMUNITY): Payer: No Typology Code available for payment source | Admitting: Licensed Clinical Social Worker

## 2019-12-14 DIAGNOSIS — F1010 Alcohol abuse, uncomplicated: Secondary | ICD-10-CM

## 2019-12-14 MED ORDER — LORAZEPAM 1 MG PO TABS
1.00 | ORAL_TABLET | ORAL | Status: DC
Start: ? — End: 2019-12-14

## 2019-12-14 MED ORDER — GUAIFENESIN 100 MG/5ML PO SYRP
200.00 | ORAL_SOLUTION | ORAL | Status: DC
Start: ? — End: 2019-12-14

## 2019-12-14 MED ORDER — CLONIDINE HCL 0.1 MG PO TABS
0.10 | ORAL_TABLET | ORAL | Status: DC
Start: ? — End: 2019-12-14

## 2019-12-14 MED ORDER — SALINE NASAL SPRAY 0.65 % NA SOLN
1.00 | NASAL | Status: DC
Start: ? — End: 2019-12-14

## 2019-12-14 MED ORDER — LOPERAMIDE HCL 2 MG PO CAPS
2.00 | ORAL_CAPSULE | ORAL | Status: DC
Start: ? — End: 2019-12-14

## 2019-12-14 MED ORDER — NICOTINE 14 MG/24HR TD PT24
1.00 | MEDICATED_PATCH | TRANSDERMAL | Status: DC
Start: 2019-12-19 — End: 2019-12-14

## 2019-12-14 MED ORDER — HALOPERIDOL 5 MG PO TABS
5.00 | ORAL_TABLET | ORAL | Status: DC
Start: ? — End: 2019-12-14

## 2019-12-14 MED ORDER — GENERIC EXTERNAL MEDICATION
5.00 | Status: DC
Start: ? — End: 2019-12-14

## 2019-12-14 MED ORDER — ALUM & MAG HYDROXIDE-SIMETH 200-200-20 MG/5ML PO SUSP
30.00 | ORAL | Status: DC
Start: ? — End: 2019-12-14

## 2019-12-14 MED ORDER — BISACODYL 5 MG PO TBEC
10.00 | DELAYED_RELEASE_TABLET | ORAL | Status: DC
Start: ? — End: 2019-12-14

## 2019-12-14 MED ORDER — BENZOCAINE-MENTHOL 6-10 MG MT LOZG
1.00 | LOZENGE | OROMUCOSAL | Status: DC
Start: ? — End: 2019-12-14

## 2019-12-14 MED ORDER — NORETHIN-ETH ESTRAD-FE BIPHAS 1 MG-10 MCG / 10 MCG PO TABS
1.00 | ORAL_TABLET | ORAL | Status: DC
Start: 2019-12-19 — End: 2019-12-14

## 2019-12-14 MED ORDER — DIPHENHYDRAMINE HCL 25 MG PO CAPS
50.00 | ORAL_CAPSULE | ORAL | Status: DC
Start: ? — End: 2019-12-14

## 2019-12-14 MED ORDER — TRAZODONE HCL 50 MG PO TABS
50.00 | ORAL_TABLET | ORAL | Status: DC
Start: ? — End: 2019-12-14

## 2019-12-14 MED ORDER — CARVEDILOL 12.5 MG PO TABS
12.50 | ORAL_TABLET | ORAL | Status: DC
Start: 2019-12-18 — End: 2019-12-14

## 2019-12-14 MED ORDER — LORAZEPAM 2 MG/ML IJ SOLN
2.00 | INTRAMUSCULAR | Status: DC
Start: ? — End: 2019-12-14

## 2019-12-14 MED ORDER — ACETAMINOPHEN 325 MG PO TABS
650.00 | ORAL_TABLET | ORAL | Status: DC
Start: ? — End: 2019-12-14

## 2019-12-14 MED ORDER — CETIRIZINE HCL 10 MG PO TABS
10.00 | ORAL_TABLET | ORAL | Status: DC
Start: ? — End: 2019-12-14

## 2019-12-14 MED ORDER — ONDANSETRON 8 MG PO TBDP
8.00 | ORAL_TABLET | ORAL | Status: DC
Start: ? — End: 2019-12-14

## 2019-12-14 MED ORDER — FLUOXETINE HCL 20 MG PO CAPS
20.00 | ORAL_CAPSULE | ORAL | Status: DC
Start: 2019-12-19 — End: 2019-12-14

## 2019-12-14 MED ORDER — DIPHENHYDRAMINE HCL 50 MG/ML IJ SOLN
50.00 | INTRAMUSCULAR | Status: DC
Start: ? — End: 2019-12-14

## 2019-12-14 MED ORDER — HYDROXYZINE HCL 25 MG PO TABS
50.00 | ORAL_TABLET | ORAL | Status: DC
Start: ? — End: 2019-12-14

## 2019-12-14 MED ORDER — QUINTABS PO TABS
1.00 | ORAL_TABLET | ORAL | Status: DC
Start: 2019-12-19 — End: 2019-12-14

## 2019-12-14 MED ORDER — NICOTINE POLACRILEX 2 MG MT GUM
2.00 | CHEWING_GUM | OROMUCOSAL | Status: DC
Start: ? — End: 2019-12-14

## 2019-12-14 MED ORDER — GENERIC EXTERNAL MEDICATION
Status: DC
Start: ? — End: 2019-12-14

## 2019-12-15 ENCOUNTER — Encounter (HOSPITAL_COMMUNITY): Payer: BC Managed Care – PPO

## 2019-12-15 NOTE — Progress Notes (Signed)
Client did not attend group. Client was recently hospitalized for SI and ETOH intoxication and detox. Clinician will follow up with client and mother to discuss stepdown options

## 2019-12-19 ENCOUNTER — Other Ambulatory Visit: Payer: Self-pay

## 2019-12-19 ENCOUNTER — Telehealth (HOSPITAL_COMMUNITY): Payer: Self-pay | Admitting: Licensed Clinical Social Worker

## 2019-12-19 ENCOUNTER — Ambulatory Visit (HOSPITAL_COMMUNITY): Payer: BC Managed Care – PPO | Admitting: Licensed Clinical Social Worker

## 2019-12-19 ENCOUNTER — Encounter: Payer: Self-pay | Admitting: Internal Medicine

## 2019-12-19 DIAGNOSIS — F1010 Alcohol abuse, uncomplicated: Secondary | ICD-10-CM

## 2019-12-19 NOTE — Progress Notes (Deleted)
Sherri Harris is a 25 y.o. female patient ***.        Harlon Ditty, LCSW

## 2019-12-19 NOTE — Progress Notes (Signed)
   THERAPIST PROGRESS NOTE  Virtual Visit via Telephone Note  I connected with Sherri Harris on 12/19/19 at  4:00 PM EST by telephone and verified that I am speaking with the correct person using two identifiers.   I discussed the limitations, risks, security and privacy concerns of performing an evaluation and management service by telephone and the availability of in person appointments. I also discussed with the patient that there may be a patient responsible charge related to this service. The patient expressed understanding and agreed to proceed.  I discussed the assessment and treatment plan with the patient. The patient was provided an opportunity to ask questions and all were answered. The patient agreed with the plan and demonstrated an understanding of the instructions.   The patient was advised to call back or seek an in-person evaluation if the symptoms worsen or if the condition fails to improve as anticipated.  I provided 10 minutes of non-face-to-face time during this encounter.   Harlon Ditty, LCSW  Session Time: 4pm-4:10pm  Participation Level: Active  Behavioral Response: NAAlertEuthymic  Type of Therapy: Assertive engagement  Treatment Goals addressed: Diagnosis: alcohol abuse  Interventions: Other: Assertive engagement  Summary: Sherri Harris is a 24 y.o. female who presents with alcohol abuse. Client reports having time to think while hospitalized. Client plans to engage in SAIOP through [facility] due to evening group which is less intrusive to school and work schedule.   Suicidal/Homicidal: Nowithout intent/plan  Therapist Response: Clinician follow up after d/c from hospital. Clinician verified client planned for continued services.  Plan: Client engaged with evening IOP classes through [facility].  Diagnosis: Axis I: Alcohol Abuse       Harlon Ditty, LCSW 12/19/2019

## 2019-12-27 ENCOUNTER — Encounter: Payer: Self-pay | Admitting: Internal Medicine

## 2019-12-27 ENCOUNTER — Ambulatory Visit (INDEPENDENT_AMBULATORY_CARE_PROVIDER_SITE_OTHER): Payer: BC Managed Care – PPO | Admitting: Internal Medicine

## 2019-12-27 DIAGNOSIS — F419 Anxiety disorder, unspecified: Secondary | ICD-10-CM | POA: Diagnosis not present

## 2019-12-27 DIAGNOSIS — F1010 Alcohol abuse, uncomplicated: Secondary | ICD-10-CM

## 2019-12-27 DIAGNOSIS — I10 Essential (primary) hypertension: Secondary | ICD-10-CM | POA: Diagnosis not present

## 2019-12-27 DIAGNOSIS — F329 Major depressive disorder, single episode, unspecified: Secondary | ICD-10-CM

## 2019-12-27 NOTE — Telephone Encounter (Signed)
Will discuss at upcoming appt.

## 2019-12-27 NOTE — Patient Instructions (Signed)
Alcohol Abuse and Dependence Information, Adult Alcohol is a widely available drug. People drink alcohol in different amounts. People who drink alcohol very often and in large amounts often have problems during and after drinking. They may develop what is called an alcohol use disorder. There are two main types of alcohol use disorders:  Alcohol abuse. This is when you use alcohol too much or too often. You may use alcohol to make yourself feel happy or to reduce stress. You may have a hard time setting a limit on the amount you drink.  Alcohol dependence. This is when you use alcohol consistently for a period of time, and your body changes as a result. This can make it hard to stop drinking because you may start to feel sick or feel different when you do not use alcohol. These symptoms are known as withdrawal. How can alcohol abuse and dependence affect me? Alcohol abuse and dependence can have a negative effect on your life. Drinking too much can lead to addiction. You may feel like you need alcohol to function normally. You may drink alcohol before work in the morning, during the day, or as soon as you get home from work in the evening. These actions can result in:  Poor work performance.  Job loss.  Financial problems.  Car crashes or criminal charges from driving after drinking alcohol.  Problems in your relationships with friends and family.  Losing the trust and respect of coworkers, friends, and family. Drinking heavily over a long period of time can permanently damage your body and brain, and can cause lifelong health issues, such as:  Damage to your liver or pancreas.  Heart problems, high blood pressure, or stroke.  Certain cancers.  Decreased ability to fight infections.  Brain or nerve damage.  Depression.  Early (premature) death. If you are careless or you crave alcohol, it is easy to drink more than your body can handle (overdose). Alcohol overdose is a serious  situation that requires hospitalization. It may lead to permanent injuries or death. What can increase my risk?  Having a family history of alcohol abuse.  Having depression or other mental health conditions.  Beginning to drink at an early age.  Binge drinking often.  Experiencing trauma, stress, and an unstable home life during childhood.  Spending time with people who drink often. What actions can I take to prevent or manage alcohol abuse and dependence?  Do not drink alcohol if: ? Your health care provider tells you not to drink. ? You are pregnant, may be pregnant, or are planning to become pregnant.  If you drink alcohol: ? Limit how much you use to:  0-1 drink a day for women.  0-2 drinks a day for men. ? Be aware of how much alcohol is in your drink. In the [place] one drink equals one 12 oz bottle of beer (355 mL), one 5 oz glass of wine (148 mL), or one 1 oz glass of hard liquor (44 mL).  Stop drinking if you have been drinking too much. This can be very hard to do if you are used to abusing alcohol. If you begin to have withdrawal symptoms, talk with your health care provider or a person that you trust. These symptoms may include anxiety, shaky hands, headache, nausea, sweating, or not being able to sleep.  Choose to drink nonalcoholic beverages in social gatherings and places where there may be alcohol. Activity  Spend more time on activities that you enjoy that do   not involve alcohol, like hobbies or exercise.  Find healthy ways to cope with stress, such as exercise, meditation, or spending time with people you care about. General information  Talk to your family, coworkers, and friends about supporting you in your efforts to stop drinking. If they drink, ask them not to drink around you. Spend more time with people who do not drink alcohol.  If you think that you have an alcohol dependency problem: ? Tell friends or family about your concerns. ? Talk with your  health care provider or another health professional about where to get help. ? Work with a therapist and a chemical dependency counselor. ? Consider joining a support group for people who struggle with alcohol abuse and dependence. Where to find support   Your health care provider.  SMART Recovery: www.smartrecovery.org Therapy and support groups  Local treatment centers or chemical dependency counselors.  Local AA groups in your community: www.aa.org Where to find more information  Centers for Disease Control and Prevention: www.cdc.gov  National Institute on Alcohol Abuse and Alcoholism: www.niaaa.nih.gov  Alcoholics Anonymous (AA): www.aa.org Contact a health care provider if:  You drank more or for longer than you intended on more than one occasion.  You tried to stop drinking or to cut back on how much you drink, but you were not able to.  You often drink to the point of vomiting or passing out.  You want to drink so badly that you cannot think about anything else.  You have problems in your life due to drinking, but you continue to drink.  You keep drinking even though you feel anxious, depressed, or have experienced memory loss.  You have stopped doing the things you used to enjoy in order to drink.  You have to drink more than you used to in order to get the effect you want.  You experience anxiety, sweating, nausea, shakiness, and trouble sleeping when you try to stop drinking. Get help right away if:  You have thoughts about hurting yourself or others.  You have serious withdrawal symptoms, including: ? Confusion. ? Racing heart. ? High blood pressure. ? Fever. If you ever feel like you may hurt yourself or others, or have thoughts about taking your own life, get help right away. You can go to your nearest emergency department or call:  Your local emergency services (911 in the [place]  A suicide crisis helpline, such as the National Suicide Prevention  Lifeline at 1-800-273-8255. This is open 24 hours a day. Summary  Alcohol abuse and dependence can have a negative effect on your life. Drinking too much or too often can lead to addiction.  If you drink alcohol, limit how much you use.  If you are having trouble keeping your drinking under control, find ways to change your behavior. Hobbies, calming activities, exercise, or support groups can help.  If you feel you need help with changing your drinking habits, talk with your health care provider, a good friend, or a therapist, or go to an AA group. This information is not intended to replace advice given to you by your health care provider. Make sure you discuss any questions you have with your health care provider. Document Revised: 01/18/2019 Document Reviewed: 12/07/2018 Elsevier Patient Education  2020 [employer]  

## 2019-12-27 NOTE — Progress Notes (Signed)
Virtual Visit via Video Note  I connected with Sherri Harris on 12/27/19 at  2:15 PM EDT by a video enabled telemedicine application and verified that I am speaking with the correct person using two identifiers.  Location: Patient: Works Provider: Office   I discussed the limitations of evaluation and management by telemedicine and the availability of in person appointments. The patient expressed understanding and agreed to proceed.  History of Present Illness:  Pt presents to the clinic today for hospital follow up. She went to the ER 12/07/19 with c/o ETOH abuse, depression and SI but without active plan. Psych was consulted, and it was felt she was appropriate for outpatient therapy. She was started on a Librium taper and advised to follow up with Behavioral Health. Over the next week, she deteriorated and was admitted to [facility] 3/2-3/7. Her Escitalopram was discontinued and she was started on Fluoxetine and Carvedilol. She was prescribed Gabapentin for her alcohol use disorder but she has not started it. She is seeing a therapist and psychiatrist.   Past Medical History:  Diagnosis Date  . Anxiety   . Hypertension     Current Outpatient Medications  Medication Sig Dispense Refill  . albuterol (VENTOLIN HFA) 108 (90 Base) MCG/ACT inhaler Inhale 2 puffs into the lungs every 6 (six) hours as needed for wheezing or shortness of breath. 8 g 0  . carvedilol (COREG) 12.5 MG tablet Take 1/2 tablet by mouth twice a day for 1 week then take 1 tablet by mouth twice a day. Make annual appt for future refills.Thank you 180 tablet 0  . chlordiazePOXIDE (LIBRIUM) 25 MG capsule 50mg  PO TID x 1D, then 25-50mg  PO BID X 1D, then 25-50mg  PO QD X 1D 10 capsule 0  . escitalopram (LEXAPRO) 20 MG tablet Take 1 tablet (20 mg total) by mouth daily. 90 tablet 3  . ibuprofen (ADVIL) 200 MG tablet Take 200 mg by mouth every 6 (six) hours as needed for fever or moderate pain.    ipratropium (ATROVENT) 0.03 % nasal  spray Place 2 sprays into both nostrils 2 (two) times daily. 30 mL 0  . naltrexone (DEPADE) 50 MG tablet Take 1 tablet (50 mg total) by mouth daily. 90 tablet 3  . Norethindrone-Ethinyl Estradiol-Fe Biphas (LO LOESTRIN FE) 1 MG-10 MCG / 10 MCG tablet Take 1 tablet by mouth daily. 3 Package 3   No current facility-administered medications for this visit.    Allergies  Allergen Reactions  . Amoxicillin Rash    REACTION: rash  . Penicillins Rash    Family History  Problem Relation Age of Onset  . Hypertension Mother   . Thyroid disease Mother   . Hypertension Maternal Aunt   . Hypertension Maternal Uncle   . Heart disease Maternal Grandmother   . Cancer Maternal Grandmother        Breast  . Hypertension Father   . Cardiomyopathy Father     Social History   Socioeconomic History  . Marital status: Single    Spouse name: Not on file  . Number of children: Not on file  . Years of education: Not on file  . Highest education level: Not on file  Occupational History  . Not on file  Tobacco Use  . Smoking status: Never Smoker  . Smokeless tobacco: Never Used  Substance and Sexual Activity  . Alcohol use: Yes    Comment: bottle of wine/day   . Drug use: No  . Sexual activity: Yes  Birth control/protection: Pill  Other Topics Concern  . Not on file  Social History Narrative  . Not on file   Social Determinants of Health   Financial Resource Strain:   . Difficulty of Paying Living Expenses:   Food Insecurity:   . Worried About Programme researcher, broadcasting/film/video in the Last Year:   . Barista in the Last Year:   Transportation Needs:   . Freight forwarder (Medical):   Marland Kitchen Lack of Transportation (Non-Medical):   Physical Activity:   . Days of Exercise per Week:   . Minutes of Exercise per Session:   Stress:   . Feeling of Stress :   Social Connections:   . Frequency of Communication with Friends and Family:   . Frequency of Social Gatherings with Friends and Family:    . Attends Religious Services:   . Active Member of Clubs or Organizations:   . Attends Banker Meetings:   Marland Kitchen Marital Status:   Intimate Partner Violence:   . Fear of Current or Ex-Partner:   . Emotionally Abused:   Marland Kitchen Physically Abused:   . Sexually Abused:      Constitutional: Denies fever, malaise, fatigue, headache or abrupt weight changes.  Respiratory: Denies difficulty breathing, shortness of breath, cough or sputum production.   Cardiovascular: Denies chest pain, chest tightness, palpitations or swelling in the hands or feet.  Gastrointestinal: Denies abdominal pain, bloating, constipation, diarrhea or blood in the stool.  Neurological: Denies dizziness, difficulty with memory, difficulty with speech or problems with balance and coordination.  Psych: Pt has a history of anxiety and depression. Denies SI/HI.  No other specific complaints in a complete review of systems (except as listed in HPI above).  Observations/Objective:  Wt Readings from Last 3 Encounters:  11/08/19 156 lb 0.6 oz (70.8 kg)  11/07/19 155 lb (70.3 kg)  08/04/19 160 lb (72.6 kg)    General: Appears her stated age, well developed, well nourished in NAD. Pulmonary/Chest: Normal effort. No respiratory distress.  Neurological: Alert and oriented.     BMET    Component Value Date/Time   NA 143 12/07/2019 1202   NA 138 01/22/2018 1012   K 3.7 12/07/2019 1202   CL 109 12/07/2019 1202   CO2 21 (L) 12/07/2019 1202   GLUCOSE 129 (H) 12/07/2019 1202   BUN 7 12/07/2019 1202   BUN 9 01/22/2018 1012   CREATININE 0.85 12/07/2019 1202   CALCIUM 8.8 (L) 12/07/2019 1202   GFRNONAA >60 12/07/2019 1202   GFRAA >60 12/07/2019 1202    Lipid Panel     Component Value Date/Time   CHOL 190 07/18/2019 1535   TRIG 160.0 (H) 07/18/2019 1535   HDL 53.10 07/18/2019 1535   CHOLHDL 4 07/18/2019 1535   VLDL 32.0 07/18/2019 1535   LDLCALC 105 (H) 07/18/2019 1535    CBC    Component Value Date/Time    WBC 6.2 12/07/2019 1202   RBC 4.74 12/07/2019 1202   HGB 15.1 (H) 12/07/2019 1202   HGB 14.5 01/22/2018 1012   HCT 44.2 12/07/2019 1202   HCT 42.2 01/22/2018 1012   PLT 282 12/07/2019 1202   PLT 337 01/22/2018 1012   MCV 93.2 12/07/2019 1202   MCV 90 01/22/2018 1012   MCH 31.9 12/07/2019 1202   MCHC 34.2 12/07/2019 1202   RDW 13.3 12/07/2019 1202   RDW 12.4 01/22/2018 1012   LYMPHSABS 1.9 12/07/2019 1202   MONOABS 0.2 12/07/2019 1202   EOSABS  0.0 12/07/2019 1202   BASOSABS 0.1 12/07/2019 1202    Hgb A1C No results found for: HGBA1C      Assessment and Plan:  Hospital Follow up for ETOH Abuse, Anxiety and Depression, SI:  Hospital notes and labs reviewed She will continue Fluoxetine She will cut her Carvedilol in half at bedtime- monitor BP and HR She will start Gabepentin 300 mg at bedtime Continue to see your therapist/psychiatrist as an outpatient  Return precautions discussed  Follow Up Instructions:    I discussed the assessment and treatment plan with the patient. The patient was provided an opportunity to ask questions and all were answered. The patient agreed with the plan and demonstrated an understanding of the instructions.   The patient was advised to call back or seek an in-person evaluation if the symptoms worsen or if the condition fails to improve as anticipated.     Webb Silversmith, NP

## 2019-12-29 ENCOUNTER — Encounter: Payer: Self-pay | Admitting: Internal Medicine

## 2019-12-29 DIAGNOSIS — F32A Depression, unspecified: Secondary | ICD-10-CM

## 2019-12-29 DIAGNOSIS — F1010 Alcohol abuse, uncomplicated: Secondary | ICD-10-CM

## 2019-12-29 DIAGNOSIS — F329 Major depressive disorder, single episode, unspecified: Secondary | ICD-10-CM

## 2019-12-30 MED ORDER — FOLIC ACID 1 MG PO TABS
1.00 | ORAL_TABLET | ORAL | Status: DC
Start: 2019-12-30 — End: 2019-12-30

## 2019-12-30 MED ORDER — QUINTABS PO TABS
1.00 | ORAL_TABLET | ORAL | Status: DC
Start: 2019-12-30 — End: 2019-12-30

## 2019-12-30 MED ORDER — GENERIC EXTERNAL MEDICATION
2.00 | Status: DC
Start: ? — End: 2019-12-30

## 2019-12-30 MED ORDER — THIAMINE HCL 100 MG PO TABS
100.00 | ORAL_TABLET | ORAL | Status: DC
Start: 2019-12-30 — End: 2019-12-30

## 2019-12-30 MED ORDER — LORAZEPAM 1 MG PO TABS
2.00 | ORAL_TABLET | ORAL | Status: DC
Start: ? — End: 2019-12-30

## 2020-01-02 ENCOUNTER — Ambulatory Visit: Payer: BC Managed Care – PPO | Admitting: Internal Medicine

## 2020-01-04 ENCOUNTER — Other Ambulatory Visit: Payer: Self-pay

## 2020-01-04 ENCOUNTER — Encounter: Payer: Self-pay | Admitting: Family Medicine

## 2020-01-04 ENCOUNTER — Ambulatory Visit (INDEPENDENT_AMBULATORY_CARE_PROVIDER_SITE_OTHER): Payer: No Typology Code available for payment source | Admitting: Family Medicine

## 2020-01-04 VITALS — BP 122/100 | HR 94 | Temp 98.3°F | Ht 70.0 in | Wt 158.5 lb

## 2020-01-04 DIAGNOSIS — H7291 Unspecified perforation of tympanic membrane, right ear: Secondary | ICD-10-CM | POA: Diagnosis not present

## 2020-01-04 DIAGNOSIS — H6981 Other specified disorders of Eustachian tube, right ear: Secondary | ICD-10-CM

## 2020-01-04 NOTE — Progress Notes (Signed)
Sherri Linzy T. Dheeraj Hail, MD [facility] and [facility] at [facility] [place], [address] Phone: [phone]  FAX: [place] - 24 y.o. female  MRN [id number]  Date of Birth: [birth year]  Visit Date: 01/04/2020  PCP: [name], NP  Referred by: [name], NP  Chief Complaint  Patient presents with  . Ear Issue    Right    This visit occurred during the SARS-CoV-2 public health emergency.  Safety protocols were in place, including screening questions prior to the visit, additional usage of staff PPE, and extensive cleaning of exam room while observing appropriate contact time as indicated for disinfecting solutions.   Subjective:   Sherri Harris is a 24 y.o. very pleasant female patient who presents with the following:  She has been doing very well with the exception of having some issues with alcohol abuse recently.  She presents after having a few days of some sound coming from her ear when she blows her nose.  She is also having a full sensation around the ear as well this is on the right side.  She does not have any sinus pressure pain, pain behind her nose or teeth.  She does not take any routine regular allergy medicine.  Will hear No illness  Has been there like a week.  Never had before.   History is significant for bilateral ear tubes as a child  Review of Systems is noted in the HPI, as appropriate  Objective:   BP (!) 122/100   Pulse 94   Temp 98.3 F (36.8 C) (Temporal)   Ht 5\' 10"  (1.778 m)   Wt 158 lb 8 oz (71.9 kg)   SpO2 97%   BMI 22.74 kg/m   GEN: No acute distress; alert,appropriate. PULM: Breathing comfortably in no respiratory distress PSYCH: Normally interactive.   External ears nontender to palpation.  The left tympanic membrane does show some scarring.  It also has some fluid.  The right tympanic membrane is also scarred and there is a's very small hole  in approximately the 5 o'clock position.  Laboratory and Imaging Data:  Assessment and Plan:     ICD-10-CM   1. Eustachian tube dysfunction, right  H69.81   2. Tympanic membrane rupture, right  H72.91    I am unsure if this TM defect is acute when she started to notice the sound, or if it is left over from childhood in her tubes.  Nevertheless, treat this as if eustachian tube dysfunction.  If symptoms persist greater than a couple of months then following up with ENT as an appropriate next step.    Patient Instructions  Eustachian Tube Dysfunction: There is a tube that connects between the sinuses and behind the ear called the "eustachian tube." Sometimes when you have allergies, a cold, or nasal congestion for any reason this tube can get blocked and pressure cannot equalize in your ears. (Like if you swim in deep water) This can also trap fluid behind the ear and give you a full, pressure-like sensation that is uncomfortable, but it is not an ear infection.  Recommendations: Afrin Nasal Spray: 2 sprays twice a day for a maximum of 3-4 days (longer than this and your nose gets addicted, and you have rebound swelling that makes it worse.) Sudafed: Either pseudoephedrine or phenylephrine. As directed on box. (Not is heart problems or high blood pressure) Anti-Histamine: Allegra, Zyrtec, or Claritin.  All over the counter now and once a day.  Nasal steroid. Nasacort is over the counter now. About 10 prescription ones exist.  If you develop fever > 100.4, then things can change fluid behind the ear does increase your risk of developing an ear infection.     Follow-up: No follow-ups on file.  No orders of the defined types were placed in this encounter.  There are no discontinued medications. No orders of the defined types were placed in this encounter.   Signed,  Elpidio Galea. Kennith Morss, MD   Outpatient Encounter Medications as of 01/04/2020  Medication Sig  . albuterol (VENTOLIN  HFA) 108 (90 Base) MCG/ACT inhaler Inhale 2 puffs into the lungs every 6 (six) hours as needed for wheezing or shortness of breath.  . carvedilol (COREG) 12.5 MG tablet Take 1/2 tablet by mouth twice a day for 1 week then take 1 tablet by mouth twice a day. Make annual appt for future refills.Thank you (Patient taking differently: Take 12.5 mg by mouth at bedtime. Take 1/2 tablet by mouth twice a day for 1 week then take 1 tablet by mouth twice a day. Make annual appt for future refills.Thank you)  . FLUoxetine (PROZAC) 20 MG capsule Take 20 mg by mouth daily.  Marland Kitchen gabapentin (NEURONTIN) 300 MG capsule Take 300 mg by mouth 3 (three) times daily.  Marland Kitchen ibuprofen (ADVIL) 200 MG tablet Take 200 mg by mouth every 6 (six) hours as needed for fever or moderate pain.  Marland Kitchen ipratropium (ATROVENT) 0.03 % nasal spray Place 2 sprays into both nostrils 2 (two) times daily.  . Norethindrone-Ethinyl Estradiol-Fe Biphas (LO LOESTRIN FE) 1 MG-10 MCG / 10 MCG tablet Take 1 tablet by mouth daily.   No facility-administered encounter medications on file as of 01/04/2020.

## 2020-01-04 NOTE — Patient Instructions (Signed)
Eustachian Tube Dysfunction: There is a tube that connects between the sinuses and behind the ear called the "eustachian tube." Sometimes when you have allergies, a cold, or nasal congestion for any reason this tube can get blocked and pressure cannot equalize in your ears. (Like if you swim in deep water) This can also trap fluid behind the ear and give you a full, pressure-like sensation that is uncomfortable, but it is not an ear infection.  Recommendations: Afrin Nasal Spray: 2 sprays twice a day for a maximum of 3-4 days (longer than this and your nose gets addicted, and you have rebound swelling that makes it worse.) Sudafed: Either pseudoephedrine or phenylephrine. As directed on box. (Not is heart problems or high blood pressure) Anti-Histamine: Allegra, Zyrtec, or Claritin. All over the counter now and once a day.  Nasal steroid. Nasacort is over the counter now. About 10 prescription ones exist.  If you develop fever > 100.4, then things can change fluid behind the ear does increase your risk of developing an ear infection.  

## 2020-01-17 ENCOUNTER — Encounter: Payer: Self-pay | Admitting: Internal Medicine

## 2020-01-17 ENCOUNTER — Other Ambulatory Visit: Payer: Self-pay | Admitting: Family Medicine

## 2020-01-17 DIAGNOSIS — H9201 Otalgia, right ear: Secondary | ICD-10-CM

## 2020-01-17 DIAGNOSIS — H7291 Unspecified perforation of tympanic membrane, right ear: Secondary | ICD-10-CM

## 2020-01-17 NOTE — Telephone Encounter (Signed)
I will forward to Dr Patsy Lager since he saw her for the ear pain and Rene Kocher is out of the office this week.

## 2020-01-26 MED FILL — LO LOESTRIN FE 1-10 TABLET: 1 MG-10 MCG | 84 days supply | Qty: 84 | Fill #1

## 2020-02-01 ENCOUNTER — Encounter: Payer: BC Managed Care – PPO | Admitting: Internal Medicine

## 2020-02-10 ENCOUNTER — Ambulatory Visit (HOSPITAL_COMMUNITY): Payer: BC Managed Care – PPO | Admitting: Psychiatry

## 2020-02-22 ENCOUNTER — Encounter (HOSPITAL_COMMUNITY): Payer: Self-pay

## 2020-02-22 ENCOUNTER — Other Ambulatory Visit: Payer: Self-pay

## 2020-02-22 ENCOUNTER — Ambulatory Visit (INDEPENDENT_AMBULATORY_CARE_PROVIDER_SITE_OTHER): Payer: No Typology Code available for payment source | Admitting: Psychiatry

## 2020-02-22 ENCOUNTER — Encounter (HOSPITAL_COMMUNITY): Payer: Self-pay | Admitting: Psychiatry

## 2020-02-22 DIAGNOSIS — F411 Generalized anxiety disorder: Secondary | ICD-10-CM

## 2020-02-22 DIAGNOSIS — F1021 Alcohol dependence, in remission: Secondary | ICD-10-CM

## 2020-02-22 DIAGNOSIS — F1094 Alcohol use, unspecified with alcohol-induced mood disorder: Secondary | ICD-10-CM | POA: Diagnosis not present

## 2020-02-22 DIAGNOSIS — F1010 Alcohol abuse, uncomplicated: Secondary | ICD-10-CM | POA: Insufficient documentation

## 2020-02-22 MED ORDER — NALTREXONE HCL 50 MG PO TABS: 50.0000 mg | ORAL_TABLET | Freq: Every day | ORAL | 2 refills | Status: AC

## 2020-02-22 MED ORDER — ESCITALOPRAM OXALATE 20 MG PO TABS: 30.0000 mg | ORAL_TABLET | Freq: Every day | ORAL | 1 refills | Status: DC

## 2020-02-22 MED ORDER — LAMOTRIGINE 100 MG PO TABS
100.0000 mg | ORAL_TABLET | Freq: Every day | ORAL | 0 refills | Status: DC
Start: 2020-03-21 — End: 2020-07-06

## 2020-02-22 MED ORDER — LAMOTRIGINE 25 MG PO TABS: ORAL_TABLET | ORAL | 0 refills | Status: DC

## 2020-02-22 NOTE — Progress Notes (Signed)
Psychiatric Initial Adult Assessment   Patient Identification: Sherri Harris MRN:  [id number] Date of Evaluation:  02/22/2020 Referral Source: Nicki Reaper NP  Interview was conducted using videoconferencing application and I verified that I was speaking with the correct person using two identifiers. I discussed the limitations of evaluation and management by telemedicine and  the availability of in person appointments. Patient expressed understanding and agreed to proceed.  Chief Complaint:   Chief Complaint    Establish Care     Visit Diagnosis:    ICD-10-CM   1. Generalized anxiety disorder  F41.1   2. Alcohol use disorder, severe, in early remission (HCC)  F10.21   3. 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 She has been abusing alcohol since her early 57s. She is a binge drinker and typhically would drink ain excess of a bottle of wine daily. She has a hx of blackouts and withdrawal sx such as tremor and diaphoresis. She denies having hx of DTs or withdrawal seizures. She has been in detox (mots recently in early March at [facility]) and in rehab last year at [facility]. She has been in OP treatment at the [facility] recently but at this time ins not seeing a counselor or participating in [place]. She was recently court stealing a bottle of wine at a store and will have a court hearing soon. She denies having hx of DUIs. She is reporting at least 4 year hx of anxiety: worries excessively, has racing thoughts. Her mood fluctuates between feeling "fine" and depressed/irritable. She also reports having low energy and poor focusing ability. Her sleep and appetite are good. She denies having bouts of decreased need for sleep, increased energy and increase in goal oriented activity or  Risk taking (besides recent stealing episode).  She denies having hx of psychosis or suicidality. There is a family hx of alcohol problem drinking. She has tried naltrexone and gabapentin for craving with questionable effect but she may not have been fully compliant with them. She has been on escitalopram for past 4 years for anxiety and it hs been initially helpful but not anymore. While at [facility] they changed it to fluoxetine but she went back to escitalopram. She is supposed to take gabapentin (from [facility]) 300 mg tid but only has been taking it once daily as it makes her groggy. She denies abusing any street/prscription drugs.  Medical hx has been reviewed - only recently diagnosed hypertension (on carvedilol).  Associated Signs/Symptoms: Depression Symptoms:  depressed mood, fatigue, difficulty concentrating, anxiety, (Hypo) Manic Symptoms:  Irritable Mood, Anxiety Symptoms:  Excessive Worry, Psychotic Symptoms:  None PTSD Symptoms: Negative  Past Psychiatric History: See above.  Previous Psychotropic Medications: Yes   Substance Abuse History in the last 12 months:  Yes.    Consequences of Substance Abuse: Legal Consequences:  recent shoplifting charges Blackouts:  frequent Withdrawal Symptoms:   Diaphoresis Tremors  Past Medical History:  Past Medical History:  Diagnosis Date  . Anxiety   . Hypertension     Past Surgical History:  Procedure Laterality Date  . TYMPANOSTOMY TUBE PLACEMENT    . WISDOM TOOTH EXTRACTION      Family Psychiatric History: Reviewed.  Family History:  Family History  Problem Relation Age of Onset  . Hypertension Mother   .  Thyroid disease Mother   . Hypertension Maternal Aunt   . Hypertension Maternal Uncle   . Alcohol abuse Maternal Uncle   . Heart disease Maternal Grandmother   . Cancer Maternal Grandmother        Breast  . Hypertension Father   . Cardiomyopathy Father   . Alcohol abuse Maternal Grandfather   . Alcohol abuse Paternal Uncle     Social  History:   Social History   Socioeconomic History  . Marital status: Single    Spouse name: Not on file  . Number of children: Not on file  . Years of education: Not on file  . Highest education level: Not on file  Occupational History  . Not on file  Tobacco Use  . Smoking status: Never Smoker  . Smokeless tobacco: Never Used  Substance and Sexual Activity  . Alcohol use: Yes    Comment: bottle of wine/day   . Drug use: No  . Sexual activity: Yes    Birth control/protection: Pill  Other Topics Concern  . Not on file  Social History Narrative  . Not on file   Social Determinants of Health   Financial Resource Strain:   . Difficulty of Paying Living Expenses:   Food Insecurity:   . Worried About Programme researcher, broadcasting/film/video in the Last Year:   . Barista in the Last Year:   Transportation Needs:   . Freight forwarder (Medical):   Marland Kitchen Lack of Transportation (Non-Medical):   Physical Activity:   . Days of Exercise per Week:   . Minutes of Exercise per Session:   Stress:   . Feeling of Stress :   Social Connections:   . Frequency of Communication with Friends and Family:   . Frequency of Social Gatherings with Friends and Family:   . Attends Religious Services:   . Active Member of Clubs or Organizations:   . Attends Banker Meetings:   Marland Kitchen Marital Status:     Additional Social History: Single, lives with mother. Sherri Harris just graduated from [employer] needs to take CNA exam. Plans to go to nursing school. No hx of trauma.  Allergies:   Allergies  Allergen Reactions  . Amoxicillin Rash    REACTION: rash  . Penicillins Rash    Metabolic Disorder Labs: No results found for: HGBA1C, MPG No results found for: PROLACTIN Lab Results  Component Value Date   CHOL 190 07/18/2019   TRIG 160.0 (H) 07/18/2019   HDL 53.10 07/18/2019   CHOLHDL 4 07/18/2019   VLDL 32.0 07/18/2019   LDLCALC 105 (H) 07/18/2019   LDLCALC 76 09/08/2016   Lab Results  Component  Value Date   TSH 0.63 07/18/2019    Therapeutic Level Labs: No results found for: LITHIUM No results found for: CBMZ No results found for: VALPROATE  Current Medications: Current Outpatient Medications  Medication Sig Dispense Refill  . carvedilol (COREG) 12.5 MG tablet Take 1/2 tablet by mouth twice a day for 1 week then take 1 tablet by mouth twice a day. Make annual appt for future refills.Thank you (Patient taking differently: Take 12.5 mg by mouth at bedtime. Take 1/2 tablet by mouth twice a day for 1 week then take 1 tablet by mouth twice a day. Make annual appt for future refills.Thank you) 180 tablet 0  . escitalopram (LEXAPRO) 20 MG tablet Take 1.5 tablets (30 mg total) by mouth daily. 45 tablet 1  . [START ON 03/21/2020] lamoTRIgine (LAMICTAL) 100  MG tablet Take 1 tablet (100 mg total) by mouth daily. 30 tablet 0  . lamoTRIgine (LAMICTAL) 25 MG tablet Take 1 tablet (25 mg total) by mouth at bedtime for 14 days, THEN 2 tablets (50 mg total) at bedtime for 14 days. 42 tablet 0  . naltrexone (DEPADE) 50 MG tablet Take 1 tablet (50 mg total) by mouth daily. 30 tablet 2  . Norethindrone-Ethinyl Estradiol-Fe Biphas (LO LOESTRIN FE) 1 MG-10 MCG / 10 MCG tablet Take 1 tablet by mouth daily. 3 Package 3   No current facility-administered medications for this visit.     Psychiatric Specialty Exam: Review of Systems  Psychiatric/Behavioral: The patient is nervous/anxious.   All other systems reviewed and are negative.   There were no vitals taken for this visit.There is no height or weight on file to calculate BMI.  General Appearance: Casual and Well Groomed  Eye Contact:  Good  Speech:  Clear and Coherent and Normal Rate  Volume:  Normal  Mood:  Anxious  Affect:  Full Range  Thought Process:  Goal Directed and Linear  Orientation:  Full (Time, Place, and Person)  Thought Content:  Logical  Suicidal Thoughts:  No  Homicidal Thoughts:  No  Memory:  Immediate;   Good Recent;    Good Remote;   Good  Judgement:  Poor  Insight:  Fair  Psychomotor Activity:  Normal  Concentration:  Concentration: Fair  Recall:  [place] of Knowledge:Good  Language: Good  Akathisia:  Negative  Handed:  Right  AIMS (if indicated):  not done  Assets:  Communication Skills Desire for Improvement Financial Resources/Insurance [place] Talents/Skills  ADL's:  Intact  Cognition: WNL  Sleep:  Good   Screenings: GAD-7     Office Visit from 11/29/2019 in [facility] [place] from 07/05/2019 in [facility] Visit from 10/14/2018 in [facility]  Total GAD-7 Score  17  5  15     PHQ2-9     Office Visit from 11/29/2019 in [facility] [place] Office Visit from 07/05/2019 in [facility] Visit from 10/14/2018 in [facility] Visit from 12/02/2017 in [facility] Visit from 09/08/2016 in [place] [facility] [place]  PHQ-2 Total Score  3  0  4  0  0  PHQ-9 Total Score  11  3  14   -  -      Assessment and Plan: 24 yo SWF with hx of alcohol problem drinking as well as anxiety and depression. She has been abusing alcohol since her early 73s. She is a binge drinker and typhically would drink ain excess of a bottle of wine daily. She has a hx of blackouts and withdrawal sx such as tremor and diaphoresis. She denies having hx of DTs or withdrawal seizures. She has been in detox (mots recently in early March at [facility]) and in rehab last year at [facility]. She has been in OP treatment at the [facility] recently but at this time ins not seeing a counselor or participating in [place]. She was recently court stealing a bottle of wine at a store and will have a court hearing soon. She denies having hx of DUIs. She is reporting at least 4 year hx of anxiety: worries excessively, has  racing thoughts. Her mood fluctuates between feeling "fine" and depressed/irritable. She also reports having low energy and  poor focusing ability. Her sleep and appetite are good. She denies having bouts of decreased need for sleep, increased energy and increase in goal oriented activity or  Risk taking (besides recent stealing episode). She denies having hx of psychosis or suicidality. There is a family hx of alcohol problem drinking. She has tried naltrexone and gabapentin for craving with questionable effect but she may not have been fully compliant with them. She has been on escitalopram for past 4 years for anxiety and it hs been initially helpful but not anymore. While at [facility] they changed it to fluoxetine but she went back to escitalopram. She is supposed to take gabapentin (from [facility]) 300 mg tid but only has been taking it once daily as it makes her groggy. She denies abusing any street/prscription drugs.  Dx: Alcohol dependence/Alcohol-induced mood disorder; GAD  Plan: We will increase Lexapro to 30 mg, stop gabapentin and instead start titration of Lamictal with target dose of 150 mg for augmentation. I will try again naltrexone for alcohol craving - she tolerated it well in the past. She is asking for al etter attesting that she has been seen today (for court?) Next appointment in 7 weeks. The plan was discussed with patient who had an opportunity to ask questions and these were all answered. I spend 60 minutes in videoconferencing with the patient and devoted approximately 50% of this time to explanation of diagnosis, discussion of treatment options and med education.  Magdalene Patricia, MD 5/12/202111:39 AM

## 2020-03-05 ENCOUNTER — Encounter: Payer: Self-pay | Admitting: Internal Medicine

## 2020-03-05 ENCOUNTER — Other Ambulatory Visit: Payer: Self-pay

## 2020-03-05 ENCOUNTER — Ambulatory Visit (INDEPENDENT_AMBULATORY_CARE_PROVIDER_SITE_OTHER): Payer: No Typology Code available for payment source | Admitting: Internal Medicine

## 2020-03-05 DIAGNOSIS — F1094 Alcohol use, unspecified with alcohol-induced mood disorder: Secondary | ICD-10-CM | POA: Diagnosis not present

## 2020-03-05 DIAGNOSIS — I158 Other secondary hypertension: Secondary | ICD-10-CM | POA: Diagnosis not present

## 2020-03-05 DIAGNOSIS — F419 Anxiety disorder, unspecified: Secondary | ICD-10-CM

## 2020-03-05 DIAGNOSIS — F329 Major depressive disorder, single episode, unspecified: Secondary | ICD-10-CM | POA: Diagnosis not present

## 2020-03-05 DIAGNOSIS — F32A Depression, unspecified: Secondary | ICD-10-CM

## 2020-03-05 NOTE — Assessment & Plan Note (Signed)
In remission Continue Naltrexone, Escitalopram, Lamotrigine Continue to follow with psych Support offered today

## 2020-03-05 NOTE — Progress Notes (Signed)
HPI  Pt presents to the clinic today to establish care. She is transferring care from Dr. Dayton Martes.  Anxiety: Persistent, managed on Escitalopram and Lamotrigine.  She follows with psychiatry. She is not currently seeing a therapist. She denies depression, SI/HI.  Alcohol Abuse: In remission. Managed on Naltrexone. She follows with psychiatry. She is not currently seeing a therapist. She denies depression, SI/HI.  HTN: Her BP today is 120/78. She is taking Carvedilol as prescribed. ECG from 01/2018 reviewed.  Flu: 06/2019 Tetanus: 07/2019 Covid: never Pap Smear: 07/2019 Dentist: biannually  Past Medical History:  Diagnosis Date  . Anxiety   . Hypertension     Current Outpatient Medications  Medication Sig Dispense Refill  . carvedilol (COREG) 12.5 MG tablet Take 1/2 tablet by mouth twice a day for 1 week then take 1 tablet by mouth twice a day. Make annual appt for future refills.Thank you (Patient taking differently: Take 12.5 mg by mouth at bedtime. Take 1/2 tablet by mouth twice a day for 1 week then take 1 tablet by mouth twice a day. Make annual appt for future refills.Thank you) 180 tablet 0  . escitalopram (LEXAPRO) 20 MG tablet Take 1.5 tablets (30 mg total) by mouth daily. 45 tablet 1  . [START ON 03/21/2020] lamoTRIgine (LAMICTAL) 100 MG tablet Take 1 tablet (100 mg total) by mouth daily. 30 tablet 0  . lamoTRIgine (LAMICTAL) 25 MG tablet Take 1 tablet (25 mg total) by mouth at bedtime for 14 days, THEN 2 tablets (50 mg total) at bedtime for 14 days. 42 tablet 0  . naltrexone (DEPADE) 50 MG tablet Take 1 tablet (50 mg total) by mouth daily. 30 tablet 2  . Norethindrone-Ethinyl Estradiol-Fe Biphas (LO LOESTRIN FE) 1 MG-10 MCG / 10 MCG tablet Take 1 tablet by mouth daily. 3 Package 3   No current facility-administered medications for this visit.    Allergies  Allergen Reactions  . Amoxicillin Rash    REACTION: rash  . Penicillins Rash    Family History  Problem Relation Age  of Onset  . Hypertension Mother   . Thyroid disease Mother   . Hypertension Maternal Aunt   . Hypertension Maternal Uncle   . Alcohol abuse Maternal Uncle   . Heart disease Maternal Grandmother   . Cancer Maternal Grandmother        Breast  . Hypertension Father   . Cardiomyopathy Father   . Alcohol abuse Maternal Grandfather   . Alcohol abuse Paternal Uncle     Social History   Socioeconomic History  . Marital status: Single    Spouse name: Not on file  . Number of children: Not on file  . Years of education: Not on file  . Highest education level: Not on file  Occupational History  . Not on file  Tobacco Use  . Smoking status: Never Smoker  . Smokeless tobacco: Never Used  Substance and Sexual Activity  . Alcohol use: Yes    Comment: bottle of wine/day   . Drug use: No  . Sexual activity: Yes    Birth control/protection: Pill  Other Topics Concern  . Not on file  Social History Narrative  . Not on file   Social Determinants of Health   Financial Resource Strain:   . Difficulty of Paying Living Expenses:   Food Insecurity:   . Worried About Programme researcher, broadcasting/film/video in the Last Year:   . Barista in the Last Year:   Transportation Needs:   .  Lack of Transportation (Medical):   Marland Kitchen Lack of Transportation (Non-Medical):   Physical Activity:   . Days of Exercise per Week:   . Minutes of Exercise per Session:   Stress:   . Feeling of Stress :   Social Connections:   . Frequency of Communication with Friends and Family:   . Frequency of Social Gatherings with Friends and Family:   . Attends Religious Services:   . Active Member of Clubs or Organizations:   . Attends Archivist Meetings:   Marland Kitchen Marital Status:   Intimate Partner Violence:   . Fear of Current or Ex-Partner:   . Emotionally Abused:   Marland Kitchen Physically Abused:   . Sexually Abused:     ROS:  Constitutional: Denies fever, malaise, fatigue, headache or abrupt weight changes.  HEENT: Denies  eye pain, eye redness, ear pain, ringing in the ears, wax buildup, runny nose, nasal congestion, bloody nose, or sore throat. Respiratory: Denies difficulty breathing, shortness of breath, cough or sputum production.   Cardiovascular: Denies chest pain, chest tightness, palpitations or swelling in the hands or feet.  Gastrointestinal: Denies abdominal pain, bloating, constipation, diarrhea or blood in the stool.  GU: Denies frequency, urgency, pain with urination, blood in urine, odor or discharge. Musculoskeletal: Denies decrease in range of motion, difficulty with gait, muscle pain or joint pain and swelling.  Skin: Denies redness, rashes, lesions or ulcercations.  Neurological: Denies dizziness, difficulty with memory, difficulty with speech or problems with balance and coordination.  Psych: Pt reports anxiety. Denies depression, SI/HI.  No other specific complaints in a complete review of systems (except as listed in HPI above).  PE: BP 120/78   Pulse 72   Temp (!) 97.2 F (36.2 C) (Temporal)   Ht 5\' 10"  (1.778 m)   Wt 150 lb (68 kg)   SpO2 98%   BMI 21.52 kg/m   Wt Readings from Last 3 Encounters:  01/04/20 158 lb 8 oz (71.9 kg)  11/08/19 156 lb 0.6 oz (70.8 kg)  11/07/19 155 lb (70.3 kg)    General: Appears her stated age, well developed, well nourished in NAD. Cardiovascular: Normal rate. Pulmonary/Chest: Normal effort. Neurological: Alert and oriented.  Psychiatric: Mood and affect normal. Behavior is normal. Judgment and thought content normal.     BMET    Component Value Date/Time   NA 143 12/07/2019 1202   NA 138 01/22/2018 1012   K 3.7 12/07/2019 1202   CL 109 12/07/2019 1202   CO2 21 (L) 12/07/2019 1202   GLUCOSE 129 (H) 12/07/2019 1202   BUN 7 12/07/2019 1202   BUN 9 01/22/2018 1012   CREATININE 0.85 12/07/2019 1202   CALCIUM 8.8 (L) 12/07/2019 1202   GFRNONAA >60 12/07/2019 1202   GFRAA >60 12/07/2019 1202    Lipid Panel     Component Value  Date/Time   CHOL 190 07/18/2019 1535   TRIG 160.0 (H) 07/18/2019 1535   HDL 53.10 07/18/2019 1535   CHOLHDL 4 07/18/2019 1535   VLDL 32.0 07/18/2019 1535   LDLCALC 105 (H) 07/18/2019 1535    CBC    Component Value Date/Time   WBC 6.2 12/07/2019 1202   RBC 4.74 12/07/2019 1202   HGB 15.1 (H) 12/07/2019 1202   HGB 14.5 01/22/2018 1012   HCT 44.2 12/07/2019 1202   HCT 42.2 01/22/2018 1012   PLT 282 12/07/2019 1202   PLT 337 01/22/2018 1012   MCV 93.2 12/07/2019 1202   MCV 90 01/22/2018 1012  MCH 31.9 12/07/2019 1202   MCHC 34.2 12/07/2019 1202   RDW 13.3 12/07/2019 1202   RDW 12.4 01/22/2018 1012   LYMPHSABS 1.9 12/07/2019 1202   MONOABS 0.2 12/07/2019 1202   EOSABS 0.0 12/07/2019 1202   BASOSABS 0.1 12/07/2019 1202    Hgb A1C No results found for: HGBA1C   Assessment and Plan:  Nicki Reaper, NP This visit occurred during the SARS-CoV-2 public health emergency.  Safety protocols were in place, including screening questions prior to the visit, additional usage of staff PPE, and extensive cleaning of exam room while observing appropriate contact time as indicated for disinfecting solutions.

## 2020-03-05 NOTE — Patient Instructions (Signed)
Social Anxiety Disorder, Adult Social anxiety disorder (SAD), previously called social phobia, is a mental health condition. People with SAD often feel nervous, afraid, or embarrassed when they are around other people in social situations. They worry that other people are judging or criticizing them for how they look, what they say, or how they act. SAD involves more than just feeling shy or self-conscious at times. It can cause severe emotional distress. It can interfere with activities of daily life. SAD also may lead to alcohol or drug use, and even suicide. SAD is a common mental health condition. It can develop at any time, but it usually starts in the teenage years. What are the causes? The cause of this condition is not known. It may involve genes that are passed through families. Stressful events may trigger anxiety. This disorder is also associated with an overactive amygdala. The amygdala is the part of the brain that triggers your response to strong feelings, such as fear. What increases the risk? This condition is more likely to develop in:  People who have a family history of anxiety disorders.  Women.  People who have a physical or behavioral condition that makes them feel self-conscious or nervous, such as a stutter or a long-term (chronic) disease. What are the signs or symptoms? The main symptom of this condition is fear of embarrassment caused by being criticized or judged in social situations. You may be afraid to:  Speak in public.  Go shopping.  Use a public bathroom.  Eat at a restaurant.  Go to work.  Interact with people you do not know. Extreme fear and anxiety may cause physical symptoms, including:  Blushing.  A fast heartbeat.  Sweating.  Shaky hands or voice.  Confusion.  Light-headedness.  Upset stomach, diarrhea, or vomiting.  Shortness of breath. How is this diagnosed? This condition is diagnosed based on your history, symptoms, and  behavior in social situations. You may be diagnosed with this type of anxiety if your symptoms have lasted for more than 6 months and have been present on more days than not. Your health care provider may ask you about your use of alcohol, drugs, and prescription medicines. He or she may also refer you to a mental health specialist for further evaluation or treatment. How is this treated? Treatment for this condition may include:  Cognitive behavioral therapy (CBT). This type of talk therapy helps you learn to replace negative thoughts and behaviors with positive ones. This may include learning how to use self-calming skills and other methods of managing your anxiety.  Exposure therapy. You will be exposed to social situations that cause you fear. The treatment starts with practicing self-calming in situations that cause you low levels of fear. Over time, you will progress by sustaining self-calming and managing harder situations.  Antidepressant medicines. These medicines may be used by themselves or in addition to other therapies.  Biofeedback. This process trains you to manage your body's response (physiological response) through breathing techniques and relaxation methods. You will work with a therapist while machines are used to monitor your physical symptoms.  Techniques for relaxation and managing anxiety. These include deep breathing, self-talk, meditation, visual imagery, muscle relaxation, music therapy, and yoga. These techniques are often used with other therapies to keep you calm in situations that cause you anxiety. These treatments are often used in combination. Follow these instructions at home: Alcohol use If you drink alcohol:  Limit how much you use to: ? 0-1 drink a day for   nonpregnant women. ? 0-2 drinks a day for men.  Be aware of how much alcohol is in your drink. In the [place] one drink equals one 12 oz bottle of beer (355 mL), one 5 oz glass of wine (148 mL), or one 1  oz glass of hard liquor (44 mL). General instructions  Take over-the-counter and prescription medicines only as told by your health care provider.  Practice techniques for relaxation and managing anxiety at times you are not challenged by social anxiety.  Return to social activities using techniques you have learned, as you feel ready to do so.  Avoid caffeine and certain over-the-counter cold medicines. These may make you feel worse. Ask your pharmacists which medicines to avoid.  Keep all follow-up visits as told by your health care provider. This is important. Where to find more information  National Alliance on Mental Illness (NAMI): https://www.nami.org  Social Anxiety Association: https://socialphobia.org  Mental Health America (MHA): https://www.mhanational.org  Anxiety and Depression Association of America (ADAA): https://adaa.org/ Contact a health care provider if:  Your symptoms do not improve or get worse.  You have signs of depression, such as: ? Persistent sadness or moodiness. ? Loss of enjoyment in activities that used to bring you joy. ? Change in weight or eating. ? Changes in sleeping habits. ? Avoiding friends or family members more than usual. ? Loss of energy for normal tasks. ? Feeling guilty or worthless.  You become more isolated than you normally are.  You find it more and more difficult to speak or interact with others.  You are using drugs.  You are drinking more alcohol than usual. Get help right away if:  You harm yourself.  You have suicidal thoughts. If you ever feel like you may hurt yourself or others, or have thoughts about taking your own life, get help right away. You can go to your nearest emergency department or call:  Your local emergency services (911 in the [place]  A suicide crisis helpline, such as the National Suicide Prevention Lifeline at 1-800-273-8255. This is open 24 hours a day. Summary  Social anxiety disorder  (SAD) may cause you to feel nervous, afraid, or embarrassed when you are around other people in social situations.  SAD is a common mental disorder. It can develop at any time, but it usually starts in the teenage years.  Treatment includes talk therapy, exposure therapy, medicines, biofeedback, and relaxation techniques. It can involve a combination of treatments. This information is not intended to replace advice given to you by your health care provider. Make sure you discuss any questions you have with your health care provider. Document Revised: 03/02/2019 Document Reviewed: 03/02/2019 Elsevier Patient Education  2020 Elsevier Inc.  

## 2020-03-05 NOTE — Assessment & Plan Note (Signed)
Continue Escitalopram, Lamotrigine Continue to follow with psych Support offered today

## 2020-03-05 NOTE — Assessment & Plan Note (Signed)
Resolved Will d/c Carvedilol

## 2020-03-13 ENCOUNTER — Other Ambulatory Visit (HOSPITAL_COMMUNITY): Payer: Self-pay | Admitting: Psychiatry

## 2020-03-15 ENCOUNTER — Other Ambulatory Visit (HOSPITAL_COMMUNITY): Payer: Self-pay | Admitting: Psychiatry

## 2020-03-26 MED FILL — ESCITALOPRAM 20 MG TABLET: 20 | 90 days supply | Qty: 90 | Fill #0

## 2020-04-12 ENCOUNTER — Encounter: Payer: Self-pay | Admitting: Internal Medicine

## 2020-04-18 ENCOUNTER — Other Ambulatory Visit: Payer: Self-pay

## 2020-04-18 ENCOUNTER — Encounter: Payer: Self-pay | Admitting: Internal Medicine

## 2020-04-18 ENCOUNTER — Telehealth (INDEPENDENT_AMBULATORY_CARE_PROVIDER_SITE_OTHER): Payer: No Typology Code available for payment source | Admitting: Internal Medicine

## 2020-04-18 DIAGNOSIS — R1084 Generalized abdominal pain: Secondary | ICD-10-CM | POA: Diagnosis not present

## 2020-04-18 DIAGNOSIS — R197 Diarrhea, unspecified: Secondary | ICD-10-CM | POA: Diagnosis not present

## 2020-04-18 DIAGNOSIS — R1111 Vomiting without nausea: Secondary | ICD-10-CM

## 2020-04-18 MED ORDER — ONDANSETRON 4 MG PO TBDP
4.0000 mg | ORAL_TABLET | Freq: Three times a day (TID) | ORAL | 0 refills | Status: DC | PRN
Start: 2020-04-18 — End: 2020-04-18

## 2020-04-18 MED ORDER — ONDANSETRON 4 MG PO TBDP: 4.0000 mg | ORAL_TABLET | Freq: Three times a day (TID) | ORAL | 0 refills | Status: DC | PRN

## 2020-04-18 MED FILL — ONDANSETRON ODT 4 MG TABLET: 4 | 6 days supply | Qty: 20 | Fill #0

## 2020-04-18 NOTE — Progress Notes (Signed)
Virtual Visit via Video Note  I connected with Sherri Harris on 04/18/20 at  2:00 PM EDT by a video enabled telemedicine application and verified that I am speaking with the correct person using two identifiers.  Location: Patient: Home Provider: Office   I discussed the limitations of evaluation and management by telemedicine and the availability of in person appointments. The patient expressed understanding and agreed to proceed.  History of Present Illness:  Pt reports abdominal pain, vomiting and diarrhea. This started yesterday. She describes the abdominal pain as sharp, intermittent. The pain does not radiate- she thinks it may be from all the vomiting. The vomiting has resolved but she continues to have diarrhea. She denies fever, chills, nausea or blood in her stool. She reports she was unable to eat dinner last night. Her mom's boyfriend is having similar smptoms. She has taken Imodium OTC with some relief.   Past Medical History:  Diagnosis Date  . Anxiety   . Hypertension     Current Outpatient Medications  Medication Sig Dispense Refill  . escitalopram (LEXAPRO) 20 MG tablet Take 1.5 tablets (30 mg total) by mouth daily. 45 tablet 1  . lamoTRIgine (LAMICTAL) 100 MG tablet Take 1 tablet (100 mg total) by mouth daily. 30 tablet 0  . naltrexone (DEPADE) 50 MG tablet Take 1 tablet (50 mg total) by mouth daily. 30 tablet 2  . Norethindrone-Ethinyl Estradiol-Fe Biphas (LO LOESTRIN FE) 1 MG-10 MCG / 10 MCG tablet Take 1 tablet by mouth daily. 3 Package 3   No current facility-administered medications for this visit.    Allergies  Allergen Reactions  . Amoxicillin Rash    REACTION: rash  . Penicillins Rash    Family History  Problem Relation Age of Onset  . Hypertension Mother   . Thyroid disease Mother   . Hypertension Maternal Aunt   . Hypertension Maternal Uncle   . Alcohol abuse Maternal Uncle   . Heart disease Maternal Grandmother   . Cancer Maternal  Grandmother        Breast  . Hypertension Father   . Cardiomyopathy Father   . Alcohol abuse Maternal Grandfather   . Alcohol abuse Paternal Uncle     Social History   Socioeconomic History  . Marital status: Single    Spouse name: Not on file  . Number of children: Not on file  . Years of education: Not on file  . Highest education level: Not on file  Occupational History  . Not on file  Tobacco Use  . Smoking status: Never Smoker  . Smokeless tobacco: Never Used  Vaping Use  . Vaping Use: Never used  Substance and Sexual Activity  . Alcohol use: Yes    Comment: bottle of wine/day   . Drug use: No  . Sexual activity: Yes    Birth control/protection: Pill  Other Topics Concern  . Not on file  Social History Narrative  . Not on file   Social Determinants of Health   Financial Resource Strain:   . Difficulty of Paying Living Expenses:   Food Insecurity:   . Worried About Programme researcher, broadcasting/film/video in the Last Year:   . Barista in the Last Year:   Transportation Needs:   . Freight forwarder (Medical):   Marland Kitchen Lack of Transportation (Non-Medical):   Physical Activity:   . Days of Exercise per Week:   . Minutes of Exercise per Session:   Stress:   . Feeling of  Stress :   Social Connections:   . Frequency of Communication with Friends and Family:   . Frequency of Social Gatherings with Friends and Family:   . Attends Religious Services:   . Active Member of Clubs or Organizations:   . Attends Banker Meetings:   Marland Kitchen Marital Status:   Intimate Partner Violence:   . Fear of Current or Ex-Partner:   . Emotionally Abused:   Marland Kitchen Physically Abused:   . Sexually Abused:      Constitutional: Denies fever, malaise, fatigue, headache or abrupt weight changes.  Respiratory: Denies difficulty breathing, shortness of breath, cough or sputum production.   Cardiovascular: Denies chest pain, chest tightness, palpitations or swelling in the hands or feet.   Gastrointestinal: Pt reports abdominal pain, vomiting, and diarrhea. Denies bloating, constipation, or blood in the stool.   No other specific complaints in a complete review of systems (except as listed in HPI above).  Observations/Objective:  Wt Readings from Last 3 Encounters:  03/05/20 150 lb (68 kg)  01/04/20 158 lb 8 oz (71.9 kg)  11/08/19 156 lb 0.6 oz (70.8 kg)    General: Appears her stated age, well developed, well nourished in NAD. Pulmonary/Chest: Normal effort. No respiratory distress.  Neurological: Alert and oriented.   BMET    Component Value Date/Time   NA 143 12/07/2019 1202   NA 138 01/22/2018 1012   K 3.7 12/07/2019 1202   CL 109 12/07/2019 1202   CO2 21 (L) 12/07/2019 1202   GLUCOSE 129 (H) 12/07/2019 1202   BUN 7 12/07/2019 1202   BUN 9 01/22/2018 1012   CREATININE 0.85 12/07/2019 1202   CALCIUM 8.8 (L) 12/07/2019 1202   GFRNONAA >60 12/07/2019 1202   GFRAA >60 12/07/2019 1202    Lipid Panel     Component Value Date/Time   CHOL 190 07/18/2019 1535   TRIG 160.0 (H) 07/18/2019 1535   HDL 53.10 07/18/2019 1535   CHOLHDL 4 07/18/2019 1535   VLDL 32.0 07/18/2019 1535   LDLCALC 105 (H) 07/18/2019 1535    CBC    Component Value Date/Time   WBC 6.2 12/07/2019 1202   RBC 4.74 12/07/2019 1202   HGB 15.1 (H) 12/07/2019 1202   HGB 14.5 01/22/2018 1012   HCT 44.2 12/07/2019 1202   HCT 42.2 01/22/2018 1012   PLT 282 12/07/2019 1202   PLT 337 01/22/2018 1012   MCV 93.2 12/07/2019 1202   MCV 90 01/22/2018 1012   MCH 31.9 12/07/2019 1202   MCHC 34.2 12/07/2019 1202   RDW 13.3 12/07/2019 1202   RDW 12.4 01/22/2018 1012   LYMPHSABS 1.9 12/07/2019 1202   MONOABS 0.2 12/07/2019 1202   EOSABS 0.0 12/07/2019 1202   BASOSABS 0.1 12/07/2019 1202    Hgb A1C No results found for: HGBA1C      Assessment and Plan:  Vomiting, Diarrhea, Abdominal Pain:  Likely viral, advised her this is likely self limiting RX for Zofran 4 mg ODT TID prn Avoid  antidiarrheals Rest, drink plenty of fluids  Return precautions discussed  Follow Up Instructions:    I discussed the assessment and treatment plan with the patient. The patient was provided an opportunity to ask questions and all were answered. The patient agreed with the plan and demonstrated an understanding of the instructions.   The patient was advised to call back or seek an in-person evaluation if the symptoms worsen or if the condition fails to improve as anticipated.    Nicki Reaper, NP

## 2020-04-18 NOTE — Patient Instructions (Signed)

## 2020-04-19 ENCOUNTER — Telehealth: Payer: Self-pay | Admitting: Internal Medicine

## 2020-04-19 NOTE — Telephone Encounter (Signed)
If worse, she may want to consider UC/ER. May need some fluids at this point.

## 2020-04-19 NOTE — Telephone Encounter (Signed)
Patient had virtual appointment yesterday and was advised to call back if she started vomiting again.,  Patient stated started vomiting again today after she ate.  she is able to keep water down but not able to keep anything else down.

## 2020-04-19 NOTE — Telephone Encounter (Signed)
Left detailed msg on VM per HIPAA  

## 2020-04-20 ENCOUNTER — Ambulatory Visit
Admission: EM | Admit: 2020-04-20 | Discharge: 2020-04-20 | Disposition: A | Discharge status: Home / Self Care | Payer: No Typology Code available for payment source | Attending: Family Medicine | Admitting: Family Medicine

## 2020-04-20 ENCOUNTER — Other Ambulatory Visit: Payer: Self-pay

## 2020-04-20 DIAGNOSIS — R112 Nausea with vomiting, unspecified: Secondary | ICD-10-CM | POA: Diagnosis not present

## 2020-04-20 DIAGNOSIS — Z880 Allergy status to penicillin: Secondary | ICD-10-CM | POA: Insufficient documentation

## 2020-04-20 DIAGNOSIS — W57XXXA Bitten or stung by nonvenomous insect and other nonvenomous arthropods, initial encounter: Secondary | ICD-10-CM | POA: Diagnosis not present

## 2020-04-20 DIAGNOSIS — Z8249 Family history of ischemic heart disease and other diseases of the circulatory system: Secondary | ICD-10-CM | POA: Diagnosis not present

## 2020-04-20 DIAGNOSIS — S0006XA Insect bite (nonvenomous) of scalp, initial encounter: Secondary | ICD-10-CM | POA: Insufficient documentation

## 2020-04-20 DIAGNOSIS — F329 Major depressive disorder, single episode, unspecified: Secondary | ICD-10-CM | POA: Diagnosis not present

## 2020-04-20 DIAGNOSIS — Z7952 Long term (current) use of systemic steroids: Secondary | ICD-10-CM | POA: Insufficient documentation

## 2020-04-20 DIAGNOSIS — I10 Essential (primary) hypertension: Secondary | ICD-10-CM | POA: Insufficient documentation

## 2020-04-20 DIAGNOSIS — R197 Diarrhea, unspecified: Secondary | ICD-10-CM | POA: Insufficient documentation

## 2020-04-20 DIAGNOSIS — F419 Anxiety disorder, unspecified: Secondary | ICD-10-CM | POA: Insufficient documentation

## 2020-04-20 DIAGNOSIS — Z79899 Other long term (current) drug therapy: Secondary | ICD-10-CM | POA: Insufficient documentation

## 2020-04-20 LAB — COMPREHENSIVE METABOLIC PANEL
ALT: 25 U/L (ref 0–44)
AST: 45 U/L — ABNORMAL HIGH (ref 15–41)
Albumin: 4.8 g/dL (ref 3.5–5.0)
Alkaline Phosphatase: 71 U/L (ref 38–126)
Anion gap: 16 — ABNORMAL HIGH (ref 5–15)
BUN: 10 mg/dL (ref 6–20)
CO2: 23 mmol/L (ref 22–32)
Calcium: 9.5 mg/dL (ref 8.9–10.3)
Chloride: 97 mmol/L — ABNORMAL LOW (ref 98–111)
Creatinine, Ser: 1.05 mg/dL — ABNORMAL HIGH (ref 0.44–1.00)
GFR calc Af Amer: >60 mL/min (ref >60)
GFR calc non Af Amer: >60 mL/min (ref >60)
Glucose, Bld: 121 mg/dL — ABNORMAL HIGH (ref 70–99)
Potassium: 3.9 mmol/L (ref 3.5–5.1)
Sodium: 136 mmol/L (ref 135–145)
Total Bilirubin: 1.9 mg/dL — ABNORMAL HIGH (ref 0.3–1.2)
Total Protein: 8.1 g/dL (ref 6.5–8.1)

## 2020-04-20 LAB — CBC WITH DIFFERENTIAL/PLATELET
Abs Immature Granulocytes: 0.05 10*3/uL (ref 0.00–0.07)
Basophils Absolute: 0.1 10*3/uL (ref 0.0–0.1)
Basophils Relative: 1 %
Eosinophils Absolute: 0 10*3/uL (ref 0.0–0.5)
Eosinophils Relative: 0 %
HCT: 42.8 % (ref 36.0–46.0)
Hemoglobin: 14.9 g/dL (ref 12.0–15.0)
Immature Granulocytes: 0 %
Lymphocytes Relative: 7 %
Lymphs Abs: 0.8 10*3/uL (ref 0.7–4.0)
MCH: 30.5 pg (ref 26.0–34.0)
MCHC: 34.8 g/dL (ref 30.0–36.0)
MCV: 87.5 fL (ref 80.0–100.0)
Monocytes Absolute: 0.3 10*3/uL (ref 0.1–1.0)
Monocytes Relative: 2 %
Neutro Abs: 10.3 10*3/uL — ABNORMAL HIGH (ref 1.7–7.7)
Neutrophils Relative %: 90 %
Platelets: 381 10*3/uL (ref 150–400)
RBC: 4.89 MIL/uL (ref 3.87–5.11)
RDW: 12.4 % (ref 11.5–15.5)
WBC: 11.6 10*3/uL — ABNORMAL HIGH (ref 4.0–10.5)
nRBC: 0 % (ref 0.0–0.2)

## 2020-04-20 LAB — LIPASE, BLOOD: Lipase: 22 U/L (ref 11–51)

## 2020-04-20 MED ORDER — DOXYCYCLINE HYCLATE 100 MG PO TABS
100.0000 mg | ORAL_TABLET | Freq: Two times a day (BID) | ORAL | 0 refills | Status: DC
Start: 2020-04-20 — End: 2020-07-06

## 2020-04-20 MED ORDER — SODIUM CHLORIDE 0.9 % IV BOLUS
1000.0000 mL | Freq: Once | INTRAVENOUS | Status: AC
  Administered 2020-04-20: 1000 mL via INTRAVENOUS

## 2020-04-20 MED ORDER — ONDANSETRON 8 MG PO TBDP: 8.0000 mg | ORAL_TABLET | Freq: Three times a day (TID) | ORAL | 0 refills | Status: DC | PRN

## 2020-04-20 MED ORDER — ONDANSETRON 8 MG PO TBDP
8.0000 mg | ORAL_TABLET | Freq: Once | ORAL | Status: AC
  Administered 2020-04-20: 8 mg via ORAL

## 2020-04-20 NOTE — ED Provider Notes (Signed)
[facility]    CSN: [id number] Arrival date & time: 04/20/20  1138      History   Chief Complaint Chief Complaint  Patient presents with  . Vomiting  . Diarrhea    HPI Sherri Harris is a 24 y.o. female.   24 yo female with a c/o watery diarrhea, nausea, vomiting, and diffuse abdominal pain for the past 4 days. Has taken over the counter medications without relief. Denies any fevers, chills, melena, hematochezia, hematemesis. States was exposed to someone who was not feeling well with nausea. However also states that she also found an engorged tick on her scalp this morning. Not sure what kind of tick it was or how long it was embedded.    Diarrhea   Past Medical History:  Diagnosis Date  . Anxiety   . Hypertension     Patient Active Problem List   Diagnosis Date Noted  . 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 Anxiety and depression 10/14/2018  . HTN (hypertension) 08/22/2016    Past Surgical History:  Procedure Laterality Date  . TYMPANOSTOMY TUBE PLACEMENT    . WISDOM TOOTH EXTRACTION      OB History   No obstetric history on file.      Home Medications    Prior to Admission medications   Medication Sig Start Date End Date Taking? Authorizing Provider  carvedilol (COREG) 12.5 MG tablet Take by mouth. 12/18/19  Yes [provider]  FLUoxetine (PROZAC) 20 MG capsule Take by mouth. 12/19/19  Yes [provider]  doxycycline (VIBRA-TABS) 100 MG tablet Take 1 tablet (100 mg total) by mouth 2 (two) times daily. 04/20/20   Payton Mccallum, MD  escitalopram (LEXAPRO) 20 MG tablet Take 1.5 tablets (30 mg total) by mouth daily. 02/22/20 04/22/20  Pucilowski, Roosvelt Maser, MD  lamoTRIgine (LAMICTAL) 100 MG tablet Take 1 tablet (100 mg total) by mouth daily. 03/21/20 04/20/20  Pucilowski, Roosvelt Maser, MD  naltrexone (DEPADE) 50 MG tablet Take 1 tablet (50 mg total) by mouth daily. 02/22/20 05/22/20  Pucilowski, Roosvelt Maser, MD    Norethindrone-Ethinyl Estradiol-Fe Biphas (LO LOESTRIN FE) 1 MG-10 MCG / 10 MCG tablet Take 1 tablet by mouth daily. 07/18/19   Dianne Dun, MD  ondansetron (ZOFRAN ODT) 8 MG disintegrating tablet Take 1 tablet (8 mg total) by mouth every 8 (eight) hours as needed. 04/20/20   Payton Mccallum, MD  predniSONE (STERAPRED UNI-PAK 21 TAB) 10 MG (21) TBPK tablet TAKE 6 TABS ON DAY 1 AND DECREASE BY 1 TAB EACH DAY FOR A TOTAL OF 6 DAYS NO NSAID WHILE TAKING THIS 01/18/20   [provider]    Family History Family History  Problem Relation Age of Onset  . Hypertension Mother   . Thyroid disease Mother   . Hypertension Maternal Aunt   . Hypertension Maternal Uncle   . Alcohol abuse Maternal Uncle   . Heart disease Maternal Grandmother   . Cancer Maternal Grandmother        Breast  . Hypertension Father   . Cardiomyopathy Father   . Alcohol abuse Maternal Grandfather   . Alcohol abuse Paternal Uncle     Social History Social History   Tobacco Use  . Smoking status: Never Smoker  . Smokeless tobacco: Never Used  Vaping Use  . Vaping Use: Never used  Substance Use Topics  . Alcohol use: Yes    Comment: bottle of wine/day   . Drug use: No  Allergies   Amoxicillin and Penicillins   Review of Systems Review of Systems  Gastrointestinal: Positive for diarrhea.     Physical Exam Triage Vital Signs ED Triage Vitals  Enc Vitals Group     BP 04/20/20 1155 127/88     Pulse Rate 04/20/20 1155 84     Resp 04/20/20 1155 17     Temp 04/20/20 1155 98.2 F (36.8 C)     Temp Source 04/20/20 1155 Oral     SpO2 04/20/20 1155 100 %     Weight 04/20/20 1201 150 lb (68 kg)     Height --      Head Circumference --      Peak Flow --      Pain Score 04/20/20 1201 8     Pain Loc --      Pain Edu? --      Excl. in GC? --    No data found.  Updated Vital Signs BP 127/88 (BP Location: Right Arm)   Pulse 84   Temp 98.2 F (36.8 C) (Oral)   Resp 17   Wt 68 kg   SpO2 100%    BMI 21.52 kg/m   Visual Acuity Right Eye Distance:   Left Eye Distance:   Bilateral Distance:    Right Eye Near:   Left Eye Near:    Bilateral Near:     Physical Exam Vitals and nursing note reviewed.  Constitutional:      General: She is not in acute distress.    Appearance: She is not toxic-appearing or diaphoretic.  Cardiovascular:     Rate and Rhythm: Normal rate.     Heart sounds: Normal heart sounds.  Pulmonary:     Effort: Pulmonary effort is normal. No respiratory distress.     Breath sounds: Normal breath sounds. No stridor. No wheezing, rhonchi or rales.  Abdominal:     General: Bowel sounds are normal. There is no distension.     Palpations: Abdomen is soft. There is no mass.     Tenderness: There is no abdominal tenderness. There is no right CVA tenderness, left CVA tenderness, guarding or rebound.     Hernia: No hernia is present.  Skin:    Findings: No rash.  Neurological:     Mental Status: She is alert.      UC Treatments / Results  Labs (all labs ordered are listed, but only abnormal results are displayed) Labs Reviewed  CBC WITH DIFFERENTIAL/PLATELET - Abnormal; Notable for the following components:      Result Value   WBC 11.6 (*)    Neutro Abs 10.3 (*)    All other components within normal limits  COMPREHENSIVE METABOLIC PANEL - Abnormal; Notable for the following components:   Chloride 97 (*)    Glucose, Bld 121 (*)    Creatinine, Ser 1.05 (*)    AST 45 (*)    Total Bilirubin 1.9 (*)    Anion gap 16 (*)    All other components within normal limits  LIPASE, BLOOD  B. BURGDORFI ANTIBODIES  ROCKY MTN SPOTTED FVR ABS PNL(IGG+IGM)    EKG   Radiology No results found.  Procedures Procedures (including critical care time)  Medications Ordered in UC Medications  ondansetron (ZOFRAN-ODT) disintegrating tablet 8 mg (8 mg Oral Given 04/20/20 1217)  sodium chloride 0.9 % bolus 1,000 mL (0 mLs Intravenous Stopped 04/20/20 1343)    Initial  Impression / Assessment and Plan / UC Course  I have reviewed  the triage vital signs and the nursing notes.  Pertinent labs & imaging results that were available during my care of the patient were reviewed by me and considered in my medical decision making (see chart for details).     Final Clinical Impressions(s) / UC Diagnoses   Final diagnoses:  Nausea vomiting and diarrhea  Tick bite, initial encounter     Discharge Instructions     Over the counter Imodium  Clear liquids then advance diet slowly as tolerated     ED Prescriptions    Medication Sig Dispense Auth. Provider   doxycycline (VIBRA-TABS) 100 MG tablet Take 1 tablet (100 mg total) by mouth 2 (two) times daily. 20 tablet Aleynah Rocchio, Pamala Hurry, MD   ondansetron (ZOFRAN ODT) 8 MG disintegrating tablet Take 1 tablet (8 mg total) by mouth every 8 (eight) hours as needed. 6 tablet Payton Mccallum, MD      1. Lab results and diagnosis reviewed with patient 2. Patient given 8mg  zofran odt x 1 and 1L NS  3. rx as per orders above; reviewed possible side effects, interactions, risks and benefits  4. Recommend supportive treatment as above 5. Lyme and RMSF titers pending 6. Follow-up prn if symptoms worsen or don't improve   PDMP not reviewed this encounter.   , MD 04/20/20 1407

## 2020-04-20 NOTE — Discharge Instructions (Addendum)
Over the counter Imodium  Clear liquids then advance diet slowly as tolerated

## 2020-04-20 NOTE — Telephone Encounter (Signed)
pts mom said pt has been vomiting with watery diarrhea for 4 days and had virtual on 04/18/20. Just found tick on pts head this morning and pt is worse today; pt has been trying to drink Pediacare or pedialyte but not able to keep anything down.pt is dizzy and mom cannot ask any further questions due to pt having to go to bathroom. Advised pt's mom per note from 04/19/20 that Pamala Hurry NP had said if worse need to go to [facility] or ED. pts mom is going to try to get pt in car and go to [facility]. If pt cannot tolerate riding in car then pts mom advised to call EMS to take pt to ED. Pt's mom voiced understanding and call ended. FYI to Pamala Hurry NP and [facility] CMA.

## 2020-04-20 NOTE — ED Triage Notes (Signed)
Pt is here with non stop diarrhea for 4 days this includes some abdominal pain and vomiting. Pt has not taken anything to relieve discomfort, pt does not want COVID testing. Pt also found a tick on her head this morning & she was able to get it off but is unknown when it originally appeared.

## 2020-04-20 NOTE — Telephone Encounter (Signed)
Noted. Likely needs Doxycycline.

## 2020-04-23 LAB — B. BURGDORFI ANTIBODIES: B burgdorferi Ab IgG+IgM: <0.91 {ISR} (ref 0.00–0.90)

## 2020-04-24 LAB — ROCKY MTN SPOTTED FVR ABS PNL(IGG+IGM)
RMSF IgG: NEGATIVE
RMSF IgM: 0.54 index (ref 0.00–0.89)

## 2020-06-20 MED FILL — LO LOESTRIN FE 1-10 TABLET: 1 MG-10 MCG | 84 days supply | Qty: 84 | Fill #2

## 2020-06-20 MED FILL — ESCITALOPRAM 20 MG TABLET: 20 | 90 days supply | Qty: 90 | Fill #1

## 2020-06-22 ENCOUNTER — Telehealth: Payer: Self-pay

## 2020-06-22 NOTE — Telephone Encounter (Signed)
I spoke with no one when I called pt and I called pt's mother and pts mother (DPR signed) said pt was with her. 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 Pt has resumed both meds and said she used to go to rehab about 1 yr ago but does not plan to go back to rehab. Pt said she did not go for IV because she was drinking gatorade, pt had taken nausea med that goes under tongue and the vomiting had stopped. I asked if pt wanted to schedule appt with Pamala Hurry NP and she said no that her mom will take care of her and never let her get like that again. Then pt wanted a note since she had been out of work and I advised we could not give a note for work if pt was not seen. Pt said OK. No SI/HI. ED precautions given and pt voiced understanding but said she would be OK. Sending to Pamala Hurry NP who is out of office but PCP and Dr Reece Agar who is in office as FYI.

## 2020-06-22 NOTE — Telephone Encounter (Signed)
Noted! Thank you

## 2020-06-22 NOTE — Telephone Encounter (Signed)
[name] - Client TELEPHONE ADVICE RECORD [facility] Patient Name: Sherri Harris Gender: Female DOB: 06-16-1996 Age: 24 Y 9 M 3 D Return Phone Number: [phone] (Primary) Address: City/State/Zip: [name] [address] Client [name] - Client Client Site [facility] - Day Physician Sherri Harris - NP Contact Type Call Who Is Calling Patient / Member / Family / Caregiver Call Type Triage / Clinical Relationship To Patient Self Return Phone Number [phone] (Primary) Chief Complaint Vomiting Reason for Call Symptomatic / Request for Health Information Initial Comment caller states she has been on drinking binge and she needs to talk to someone, she also stated that she needs some meds for vomiting Translation No No Triage Reason Patient declined Nurse Assessment Nurse: Izora Ribas, RN, Melanie Date/Time (Eastern Time): 06/22/2020 2:52:34 PM Confirm and document reason for call. If symptomatic, describe symptoms. ---Caller states she has been on drinking binge x 5 days and she needs to talk to someone, she also stated that she needs some meds for vomiting. Is an alcoholic, been sober for a few months now but got off birth control and got back on it. Taking her "crazy pill" citalopram. Had new patient appt like 3 months ago [employer] Has the patient had close contact with a person known or suspected to have the novel coronavirus illness OR traveled / lives in area with major community spread (including international travel) in the last 14 days from the onset of symptoms? * If Asymptomatic, screen for exposure and travel within the last 14 days. ---No Does the patient have any new or worsening symptoms? ---Yes Will a triage be completed? ---No Select reason for no triage. ---Patient declined Please document clinical information provided and list any resource used. ---She is going to get IV  hydration. She resumed her BC pills and citalopram and realizes that she needs to get help. She will get to feeling better today and try to keep her job so she can keep insurance to get counseling. Disp. Time Lamount Cohen Time) Disposition Final User 06/22/2020 3:05:05 PM Clinical Call Yes Izora Ribas, RN, Melanie PLEASE NOTE: All timestamps contained within this report are represented as [place] Standard Time. CONFIDENTIALTY NOTICE: This fax transmission is intended only for the addressee. It contains information that is legally privileged, confidential or otherwise protected from use or disclosure. If you are not the intended recipient, you are strictly prohibited from reviewing, disclosing, copying using or disseminating any of this information or taking any action in reliance on or regarding this information. If you have received this fax in error, please notify us immediately by telephone so that we can arrange for its return to [name]. Phone: [phone], Toll-Free: [phone], Fax: [phone] Page: 2 of 2 Call Id: [id number] Comments User: Patria Mane, RN Date/Time Lamount Cohen Time): 06/22/2020 3:00:27 PM Attempted to call the backline and got no way to leave a message.

## 2020-06-24 ENCOUNTER — Other Ambulatory Visit: Payer: Self-pay

## 2020-06-24 ENCOUNTER — Ambulatory Visit
Admission: EM | Admit: 2020-06-24 | Discharge: 2020-06-24 | Disposition: A | Discharge status: Home / Self Care | Payer: No Typology Code available for payment source | Attending: Emergency Medicine | Admitting: Emergency Medicine

## 2020-06-24 DIAGNOSIS — R0602 Shortness of breath: Secondary | ICD-10-CM | POA: Insufficient documentation

## 2020-06-24 DIAGNOSIS — F419 Anxiety disorder, unspecified: Secondary | ICD-10-CM | POA: Diagnosis not present

## 2020-06-24 DIAGNOSIS — I10 Essential (primary) hypertension: Secondary | ICD-10-CM | POA: Diagnosis not present

## 2020-06-24 DIAGNOSIS — Z880 Allergy status to penicillin: Secondary | ICD-10-CM | POA: Insufficient documentation

## 2020-06-24 DIAGNOSIS — F329 Major depressive disorder, single episode, unspecified: Secondary | ICD-10-CM | POA: Diagnosis not present

## 2020-06-24 DIAGNOSIS — Z793 Long term (current) use of hormonal contraceptives: Secondary | ICD-10-CM | POA: Diagnosis not present

## 2020-06-24 DIAGNOSIS — Z79899 Other long term (current) drug therapy: Secondary | ICD-10-CM | POA: Insufficient documentation

## 2020-06-24 DIAGNOSIS — B349 Viral infection, unspecified: Secondary | ICD-10-CM

## 2020-06-24 DIAGNOSIS — R030 Elevated blood-pressure reading, without diagnosis of hypertension: Secondary | ICD-10-CM

## 2020-06-24 DIAGNOSIS — Z20822 Contact with and (suspected) exposure to covid-19: Secondary | ICD-10-CM | POA: Insufficient documentation

## 2020-06-24 NOTE — ED Provider Notes (Signed)
[facility]    CSN: [id number] Arrival date & time: 06/24/20  1139      History   Chief Complaint Chief Complaint  Patient presents with  . Shortness of Breath    HPI Sherri Harris is a 24 y.o. female.   Patient presents with headache, fatigue, low-grade fever, body aches, cough, shortness of breath, sore throat x5 days.  She was sent home from work on 06/19/2020 and instructed to get a COVID test.  She has been treating her symptoms at home with OTC cold medication.  She denies vomiting, diarrhea, rash, or other symptoms.  Patient works in [employer], engineering.  Patient states her blood pressure is usually elevated when she is sick; she has been treated for this in the past with a beta-blocker.  The history is provided by the patient.    Past Medical History:  Diagnosis Date  . Anxiety   . Hypertension     Patient Active Problem List   Diagnosis Date Noted  . 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 Anxiety and depression 10/14/2018  . HTN (hypertension) 08/22/2016    Past Surgical History:  Procedure Laterality Date  . TYMPANOSTOMY TUBE PLACEMENT    . WISDOM TOOTH EXTRACTION      OB History   No obstetric history on file.      Home Medications    Prior to Admission medications   Medication Sig Start Date End Date Taking? Authorizing Provider  escitalopram (LEXAPRO) 20 MG tablet Take 1.5 tablets (30 mg total) by mouth daily. 02/22/20 06/24/20 Yes Pucilowski, Olgierd A, MD  Norethindrone-Ethinyl Estradiol-Fe Biphas (LO LOESTRIN FE) 1 MG-10 MCG / 10 MCG tablet Take 1 tablet by mouth daily. 07/18/19  Yes Dianne Dun, MD  carvedilol (COREG) 12.5 MG tablet Take by mouth. 12/18/19   [provider]  doxycycline (VIBRA-TABS) 100 MG tablet Take 1 tablet (100 mg total) by mouth 2 (two) times daily. 04/20/20   Payton Mccallum, MD  FLUoxetine (PROZAC) 20 MG capsule Take by mouth. 12/19/19   [provider]  lamoTRIgine  (LAMICTAL) 100 MG tablet Take 1 tablet (100 mg total) by mouth daily. 03/21/20 04/20/20  Pucilowski, Roosvelt Maser, MD  ondansetron (ZOFRAN ODT) 8 MG disintegrating tablet Take 1 tablet (8 mg total) by mouth every 8 (eight) hours as needed. 04/20/20   Payton Mccallum, MD  predniSONE (STERAPRED UNI-PAK 21 TAB) 10 MG (21) TBPK tablet TAKE 6 TABS ON DAY 1 AND DECREASE BY 1 TAB EACH DAY FOR A TOTAL OF 6 DAYS NO NSAID WHILE TAKING THIS 01/18/20   [provider]    Family History Family History  Problem Relation Age of Onset  . Hypertension Mother   . Thyroid disease Mother   . Hypertension Maternal Aunt   . Hypertension Maternal Uncle   . Alcohol abuse Maternal Uncle   . Heart disease Maternal Grandmother   . Cancer Maternal Grandmother        Breast  . Hypertension Father   . Cardiomyopathy Father   . Alcohol abuse Maternal Grandfather   . Alcohol abuse Paternal Uncle     Social History Social History   Tobacco Use  . Smoking status: Never Smoker  . Smokeless tobacco: Never Used  Vaping Use  . Vaping Use: Never used  Substance Use Topics  . Alcohol use: Yes    Comment: bottle of wine/day   . Drug use: No     Allergies   Amoxicillin and  Penicillins   Review of Systems Review of Systems  Constitutional: Positive for chills, fatigue and fever.  HENT: Positive for sore throat. Negative for congestion and ear pain.   Eyes: Negative for pain and visual disturbance.  Respiratory: Positive for cough and shortness of breath.   Cardiovascular: Negative for chest pain and palpitations.  Gastrointestinal: Negative for abdominal pain, diarrhea and vomiting.  Genitourinary: Negative for dysuria and hematuria.  Musculoskeletal: Negative for arthralgias and back pain.  Skin: Negative for color change and rash.  Neurological: Positive for headaches. Negative for seizures and syncope.  All other systems reviewed and are negative.    Physical Exam Triage Vital Signs ED Triage Vitals    Enc Vitals Group     BP 06/24/20 1248 (!) 127/106     Pulse Rate 06/24/20 1248 (!) 113     Resp 06/24/20 1248 18     Temp 06/24/20 1248 98.7 F (37.1 C)     Temp Source 06/24/20 1248 Oral     SpO2 06/24/20 1248 99 %     Weight 06/24/20 1243 145 lb (65.8 kg)     Height 06/24/20 1243 5\' 10"  (1.778 m)     Head Circumference --      Peak Flow --      Pain Score 06/24/20 1242 3     Pain Loc --      Pain Edu? --      Excl. in GC? --    No data found.  Updated Vital Signs BP (!) 127/106 (BP Location: Left Arm)   Pulse (!) 113   Temp 98.7 F (37.1 C) (Oral)   Resp 18   Ht 5\' 10"  (1.778 m)   Wt 145 lb (65.8 kg)   LMP 06/22/2020   SpO2 99%   BMI 20.81 kg/m   Visual Acuity Right Eye Distance:   Left Eye Distance:   Bilateral Distance:    Right Eye Near:   Left Eye Near:    Bilateral Near:     Physical Exam Vitals and nursing note reviewed.  Constitutional:      General: She is not in acute distress.    Appearance: She is well-developed. She is not ill-appearing.  HENT:     Head: Normocephalic and atraumatic.     Right Ear: Tympanic membrane normal.     Left Ear: Tympanic membrane normal.     Nose: Nose normal.     Mouth/Throat:     Mouth: Mucous membranes are moist.     Pharynx: Oropharynx is clear.  Eyes:     Conjunctiva/sclera: Conjunctivae normal.  Cardiovascular:     Rate and Rhythm: Normal rate and regular rhythm.     Heart sounds: No murmur heard.   Pulmonary:     Effort: Pulmonary effort is normal. No respiratory distress.     Breath sounds: Normal breath sounds.  Abdominal:     Palpations: Abdomen is soft.     Tenderness: There is no abdominal tenderness. There is no guarding or rebound.  Musculoskeletal:     Cervical back: Neck supple.  Skin:    General: Skin is warm and dry.     Findings: No rash.  Neurological:     General: No focal deficit present.     Mental Status: She is alert and oriented to person, place, and time.     Gait: Gait  normal.  Psychiatric:        Mood and Affect: Mood normal.  Behavior: Behavior normal.      UC Treatments / Results  Labs (all labs ordered are listed, but only abnormal results are displayed) Labs Reviewed  SARS CORONAVIRUS 2 (TAT 6-24 HRS)    EKG   Radiology No results found.  Procedures Procedures (including critical care time)  Medications Ordered in UC Medications - No data to display  Initial Impression / Assessment and Plan / UC Course  I have reviewed the triage vital signs and the nursing notes.  Pertinent labs & imaging results that were available during my care of the patient were reviewed by me and considered in my medical decision making (see chart for details).   Viral illness.  Elevated blood pressure reading.  PCR COVID pending.  Instructed patient to self quarantine until the test result is back.  Discussed symptomatic treatment.  Instructed patient to go to the ED if she has acute worsening symptoms.  Discussed that her blood pressure is elevated today and needs to be rechecked by her PCP in 1 to 2 weeks.  Patient agrees to plan of care.   Final Clinical Impressions(s) / UC Diagnoses   Final diagnoses:  Viral illness  Elevated blood pressure reading     Discharge Instructions     Your COVID test is pending.  You should self quarantine until the test result is back.    Take Tylenol as needed for fever or discomfort.  Rest and keep yourself hydrated.    Go to the emergency department if you develop acute worsening symptoms.    Your blood pressure is elevated today at 127/106.  Please have this rechecked by your primary care provider in 1-2 weeks.         ED Prescriptions    None     PDMP not reviewed this encounter.   Mickie Bail, NP 06/24/20 1329

## 2020-06-24 NOTE — ED Triage Notes (Addendum)
Patient states that she has been shortness of breath, headache, sore throat, fever, body aches x Tuesday. States that she was sent home from work and was told to get tested.

## 2020-06-24 NOTE — Discharge Instructions (Addendum)
Your COVID test is pending.  You should self quarantine until the test result is back.    Take Tylenol as needed for fever or discomfort.  Rest and keep yourself hydrated.    Go to the emergency department if you develop acute worsening symptoms.    Your blood pressure is elevated today at 127/106.  Please have this rechecked by your primary care provider in 1-2 weeks.

## 2020-06-25 LAB — SARS CORONAVIRUS 2 (TAT 6-24 HRS): SARS Coronavirus 2: NEGATIVE

## 2020-07-06 ENCOUNTER — Other Ambulatory Visit: Payer: Self-pay

## 2020-07-06 ENCOUNTER — Ambulatory Visit (HOSPITAL_COMMUNITY)
Admission: EM | Admit: 2020-07-06 | Discharge: 2020-07-06 | Disposition: A | Discharge status: Home / Self Care | Payer: BC Managed Care – PPO | Attending: Psychiatry | Admitting: Psychiatry

## 2020-07-06 DIAGNOSIS — F1020 Alcohol dependence, uncomplicated: Secondary | ICD-10-CM | POA: Diagnosis not present

## 2020-07-06 DIAGNOSIS — F411 Generalized anxiety disorder: Secondary | ICD-10-CM

## 2020-07-06 DIAGNOSIS — F332 Major depressive disorder, recurrent severe without psychotic features: Secondary | ICD-10-CM | POA: Diagnosis not present

## 2020-07-06 LAB — POCT PREGNANCY, URINE: Preg Test, Ur: NEGATIVE

## 2020-07-06 MED ORDER — GABAPENTIN 100 MG PO CAPS
100.0000 mg | ORAL_CAPSULE | Freq: Three times a day (TID) | ORAL | 1 refills | Status: DC
Start: 2020-07-06 — End: 2020-07-24

## 2020-07-06 MED ORDER — CHLORDIAZEPOXIDE HCL 25 MG PO CAPS: ORAL_CAPSULE | ORAL | 0 refills | Status: DC

## 2020-07-06 MED ORDER — ACAMPROSATE CALCIUM 333 MG PO TBEC: 666.0000 mg | DELAYED_RELEASE_TABLET | Freq: Three times a day (TID) | ORAL | 1 refills | Status: DC

## 2020-07-06 MED FILL — ACAMPROSATE CALC DR 333 MG: 333 | 30 days supply | Qty: 180 | Fill #0

## 2020-07-06 MED FILL — GABAPENTIN 100 MG CAPSULE: 100 | 30 days supply | Qty: 90 | Fill #0
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
## 2020-07-06 NOTE — ED Provider Notes (Signed)
Behavioral Health Urgent Care Medical Screening Exam  Patient Name: Sherri Harris MRN: [id number] Date of Evaluation: 07/06/20 Chief Complaint: Chief Complaint/Presenting Problem: Client presents as a referral following ED visit for alcohol abuse Diagnosis:  Final diagnoses:  Alcoholism (HCC)    History of Present illness: Sherri Harris is a 24 y.o. female who presented voluntarily as a walk-in to the [facility] with her mother and aunt. She reports relapsing on alcohol after a sobriety of two months. She reports making suicidal threats to cut her wrist while intoxicated, but stated that she doesn't want to die. She reports seeking treatment today for alcohol abuse. She stated that she's willing to try anything, but she does not want long-term treatment. She stated that she's been to long-term treatment facilities in the past and felt "locked up."  She stated "there's no reason why I drink. I just crave alcohol." She reported previously taking naltrexone for alcohol cravings but stated that it was not effective. She reported previously taking Neurontin 300 mg PO TID but stopped taking it because it made her sleepy. She requested to have a new medication she can take that will help with the alcohol cravings. She reported drinking two bottles of wine daily. She reports no previous history of severe alcohol withdrawal symptoms, including seizures and hallucinations. She is currently not in alcohol withdrawal.   We discussed treatment options that includes starting Campral 666 mg PO TID, Neurontin 100 mg PO TID and a Librium 25 mg PO taper over a four day period that consist of Day 1 (4-tablets), Day 2 (3-tablets), Day 3 (2-tablets), and Day 4 (1-tablet). A urine pregnancy test was obtained to r/o pregnancy. We discussed allowing her mother to control and administer the Librium for withdrawal symptoms as a safety precaution. We dicussed the importance of the patient following up  with outpatient psychiatry and therapy. A list of outpatient resources was provided to the patient.   With verbal consent from the patient, I spoke with [name], the patient's mother and aunt to discuss the stated treatment plan. [name] agreed to control and administer the Librium as a safety measure. No safety concerns voiced by [name] and the patient is safe to return home with her mother.   Case discussed with Dr. Lucianne Muss who agrees to the stated plan of care.    The patient demonstrates the following risk factors for suicide: Chronic risk factors for suicide include: substance use disorder. Acute risk factors for suicide include: unemployment. Protective factors for this patient include: positive social support and hope for the future. Considering these factors, the overall suicide risk at this point appears to be low. Patient is appropriate for outpatient follow up.   Psychiatric Specialty Exam  Presentation  General Appearance:Appropriate for Environment;Casual  Eye Contact:Good  Speech:Clear and Coherent;Normal Rate  Speech Volume:Normal  Handedness:Right   Mood and Affect  Mood:Euthymic  Affect:Congruent;Appropriate   Thought Process  Thought Processes:Coherent  Descriptions of Associations:Intact  Orientation:Full (Time, Place and Person)  Thought Content:WDL  Hallucinations:None  Ideas of Reference:None  Suicidal Thoughts:No  Homicidal Thoughts:No   Sensorium  Memory:Immediate Good;Recent Good;Remote Good  Judgment:Good  Insight:Good   Executive Functions  Concentration:Good  Attention Span:Good  Recall:Good  Fund of Knowledge:Good  Language:Good   Psychomotor Activity  Psychomotor Activity:Normal   Assets  Assets:Communication Skills;Desire for Improvement;Financial Resources/Insurance;Housing;Social Support;Physical Health;Transportation   Sleep  Sleep:Good  Number of hours: No data recorded  Physical Exam: Physical Exam Vitals and  nursing note reviewed.  Constitutional:      Appearance: She is well-developed.  Cardiovascular:     Rate and Rhythm: Normal rate.     Comments: Hypertensive Pulmonary:     Effort: Pulmonary effort is normal.  Musculoskeletal:        General: Normal range of motion.  Skin:    General: Skin is warm.  Neurological:     Mental Status: She is alert and oriented to person, place, and time.    Review of Systems  Constitutional: Negative.   HENT: Negative.   Eyes: Negative.   Respiratory: Negative.   Cardiovascular: Negative.   Gastrointestinal: Negative.   Genitourinary: Negative.   Musculoskeletal: Negative.   Skin: Negative.   Neurological: Negative.   Endo/Heme/Allergies: Negative.   Psychiatric/Behavioral: Positive for substance abuse.   Blood pressure (!) 139/102, pulse (!) 109, temperature 98.4 F (36.9 C), temperature source Oral, resp. rate 18, height 5\' 10"  (1.778 m), weight 151 lb (68.5 kg), last menstrual period 06/22/2020, SpO2 96 %. Body mass index is 21.67 kg/m.  Musculoskeletal: Strength & Muscle Tone: within normal limits Gait & Station: normal Patient leans: N/A   [facility] MSE Discharge Disposition for Follow up and Recommendations: Based on my evaluation the patient does not appear to have an emergency medical condition and can be discharged with resources and follow up care in outpatient services for Medication Management, Substance Abuse Intensive Outpatient Program and Individual Therapy   08/22/2020, FNP 07/06/2020, 12:51 PM

## 2020-07-06 NOTE — Discharge Instructions (Addendum)
Take medications as prescribed Return if symptoms worsen Schedule an appointment for outpatient visits through resources proivided

## 2020-07-06 NOTE — ED Notes (Signed)
128/102 manual blood pressure completed by [name] RN

## 2020-07-06 NOTE — ED Notes (Signed)
Patient discharged home. AVS/Follow-up/Prescriptions reviewed with pt and written copy given to pt. She verbalized understanding. Pt escorted to lobby by staff.

## 2020-07-08 LAB — POCT PREGNANCY, URINE: Preg Test, Ur: NEGATIVE

## 2020-07-17 ENCOUNTER — Encounter: Payer: Self-pay | Admitting: Internal Medicine

## 2020-07-17 DIAGNOSIS — Z114 Encounter for screening for human immunodeficiency virus [HIV]: Secondary | ICD-10-CM

## 2020-07-17 DIAGNOSIS — Z111 Encounter for screening for respiratory tuberculosis: Secondary | ICD-10-CM

## 2020-07-24 ENCOUNTER — Other Ambulatory Visit: Payer: Self-pay

## 2020-07-24 ENCOUNTER — Telehealth: Payer: Self-pay | Admitting: Internal Medicine

## 2020-07-24 ENCOUNTER — Encounter: Payer: Self-pay | Admitting: Internal Medicine

## 2020-07-24 ENCOUNTER — Ambulatory Visit (INDEPENDENT_AMBULATORY_CARE_PROVIDER_SITE_OTHER): Payer: No Typology Code available for payment source | Admitting: Internal Medicine

## 2020-07-24 VITALS — BP 124/80 | HR 88 | Temp 97.8°F | Wt 163.0 lb

## 2020-07-24 DIAGNOSIS — N946 Dysmenorrhea, unspecified: Secondary | ICD-10-CM | POA: Diagnosis not present

## 2020-07-24 DIAGNOSIS — F419 Anxiety disorder, unspecified: Secondary | ICD-10-CM

## 2020-07-24 DIAGNOSIS — F1010 Alcohol abuse, uncomplicated: Secondary | ICD-10-CM | POA: Diagnosis not present

## 2020-07-24 DIAGNOSIS — F32A Depression, unspecified: Secondary | ICD-10-CM

## 2020-07-24 NOTE — Telephone Encounter (Signed)
noted 

## 2020-07-24 NOTE — Telephone Encounter (Signed)
Pt has appt at 4pm today and wanted to make you aware that she will be bringing ADA forms for you to complete.  Thank you!!

## 2020-07-24 NOTE — Progress Notes (Signed)
Subjective:    Patient ID: Sherri Harris, female    DOB: [birth year], 24 y.o.   MRN: [id number]  HPI  Pt presents to the clinic today to discuss her alcoholism. She reports her most recent binge started 5 months ago, secondary to painful periods. She reports the periods were so painful, that made her anxious and depressed so she started binge drinking. She was able to go into remission with outpatient therapy. She is seeing a therapist and a psychiatrist. She is currently managed on Campral, Librium and Gabapentin. She does have ADA accomadation forms that need to be completed so that she can return to work.  Review of Systems      Past Medical History:  Diagnosis Date  . Anxiety   . Hypertension     Current Outpatient Medications  Medication Sig Dispense Refill  . acamprosate (CAMPRAL) 333 MG tablet Take 2 tablets (666 mg total) by mouth 3 (three) times daily with meals. 180 tablet 1  . chlordiazePOXIDE (LIBRIUM) 25 MG capsule 1 tab 4 times a day x 1 day, then 1 tab 3 times a day x 1 day, then 1 tab 2 times a day x 1 day, then 1 tab a day x 1 day 10 capsule 0  . escitalopram (LEXAPRO) 20 MG tablet Take 1.5 tablets (30 mg total) by mouth daily. 45 tablet 1  . gabapentin (NEURONTIN) 100 MG capsule Take 1 capsule (100 mg total) by mouth 3 (three) times daily. 90 capsule 1  . Norethindrone-Ethinyl Estradiol-Fe Biphas (LO LOESTRIN FE) 1 MG-10 MCG / 10 MCG tablet Take 1 tablet by mouth daily. 3 Package 3   No current facility-administered medications for this visit.    Allergies  Allergen Reactions  . Amoxicillin Rash    REACTION: rash  . Penicillins Rash    Family History  Problem Relation Age of Onset  . Hypertension Mother   . Thyroid disease Mother   . Hypertension Maternal Aunt   . Hypertension Maternal Uncle   . Alcohol abuse Maternal Uncle   . Heart disease Maternal Grandmother   . Cancer Maternal Grandmother        Breast  . Hypertension Father   . Cardiomyopathy  Father   . Alcohol abuse Maternal Grandfather   . Alcohol abuse Paternal Uncle     Social History   Socioeconomic History  . Marital status: Single    Spouse name: Not on file  . Number of children: Not on file  . Years of education: Not on file  . Highest education level: Not on file  Occupational History  . Not on file  Tobacco Use  . Smoking status: Never Smoker  . Smokeless tobacco: Never Used  Vaping Use  . Vaping Use: Never used  Substance and Sexual Activity  . Alcohol use: Yes    Comment: bottle of wine/day   . Drug use: No  . Sexual activity: Yes    Birth control/protection: Pill  Other Topics Concern  . Not on file  Social History Narrative  . Not on file   Social Determinants of Health   Financial Resource Strain:   . Difficulty of Paying Living Expenses: Not on file  Food Insecurity:   . Worried About Programme researcher, broadcasting/film/video in the Last Year: Not on file  . Ran Out of Food in the Last Year: Not on file  Transportation Needs:   . Lack of Transportation (Medical): Not on file  . Lack of Transportation (Non-Medical):  Not on file  Physical Activity:   . Days of Exercise per Week: Not on file  . Minutes of Exercise per Session: Not on file  Stress:   . Feeling of Stress : Not on file  Social Connections:   . Frequency of Communication with Friends and Family: Not on file  . Frequency of Social Gatherings with Friends and Family: Not on file  . Attends Religious Services: Not on file  . Active Member of Clubs or Organizations: Not on file  . Attends Banker Meetings: Not on file  . Marital Status: Not on file  Intimate Partner Violence:   . Fear of Current or Ex-Partner: Not on file  . Emotionally Abused: Not on file  . Physically Abused: Not on file  . Sexually Abused: Not on file     Constitutional: Denies fever, malaise, fatigue, headache or abrupt weight changes.  Respiratory: Denies difficulty breathing, shortness of breath, cough or  sputum production.   Cardiovascular: Denies chest pain, chest tightness, palpitations or swelling in the hands or feet.  Gastrointestinal: Denies abdominal pain, bloating, constipation, diarrhea or blood in the stool.  GU: Pt reports painful menses. Denies urgency, frequency, pain with urination, burning sensation, blood in urine, odor or discharge. Neurological: Denies dizziness, difficulty with memory, difficulty with speech or problems with balance and coordination.  Psych: Pt has a history of anxiety and depression. Denies SI/HI.  No other specific complaints in a complete review of systems (except as listed in HPI above).  Objective:   Physical Exam  BP 124/80   Pulse 88   Temp 97.8 F (36.6 C) (Temporal)   Wt 163 lb (73.9 kg)   SpO2 98%   BMI 23.39 kg/m   Wt Readings from Last 3 Encounters:  06/24/20 145 lb (65.8 kg)  04/20/20 150 lb (68 kg)  03/05/20 150 lb (68 kg)    General: Appears her stated age, well developed, well nourished in NAD. Cardiovascular: Normal rate. Pulmonary/Chest: Normal effort and positive vesicular breath sounds.  Neurological: Alert and oriented. Psychiatric: Mood and affect normal. Behavior is normal. Judgment and thought content normal.   BMET    Component Value Date/Time   NA 136 04/20/2020 1216   NA 138 01/22/2018 1012   K 3.9 04/20/2020 1216   CL 97 (L) 04/20/2020 1216   CO2 23 04/20/2020 1216   GLUCOSE 121 (H) 04/20/2020 1216   BUN 10 04/20/2020 1216   BUN 9 01/22/2018 1012   CREATININE 1.05 (H) 04/20/2020 1216   CALCIUM 9.5 04/20/2020 1216   GFRNONAA >60 04/20/2020 1216   GFRAA >60 04/20/2020 1216    Lipid Panel     Component Value Date/Time   CHOL 190 07/18/2019 1535   TRIG 160.0 (H) 07/18/2019 1535   HDL 53.10 07/18/2019 1535   CHOLHDL 4 07/18/2019 1535   VLDL 32.0 07/18/2019 1535   LDLCALC 105 (H) 07/18/2019 1535    CBC    Component Value Date/Time   WBC 11.6 (H) 04/20/2020 1216   RBC 4.89 04/20/2020 1216   HGB  14.9 04/20/2020 1216   HGB 14.5 01/22/2018 1012   HCT 42.8 04/20/2020 1216   HCT 42.2 01/22/2018 1012   PLT 381 04/20/2020 1216   PLT 337 01/22/2018 1012   MCV 87.5 04/20/2020 1216   MCV 90 01/22/2018 1012   MCH 30.5 04/20/2020 1216   MCHC 34.8 04/20/2020 1216   RDW 12.4 04/20/2020 1216   RDW 12.4 01/22/2018 1012   LYMPHSABS 0.8  04/20/2020 1216   MONOABS 0.3 04/20/2020 1216   EOSABS 0.0 04/20/2020 1216   BASOSABS 0.1 04/20/2020 1216    Hgb A1C No results found for: HGBA1C        Assessment & Plan:  Alcoholism, Dysmenorrhea, Anxiety and Depression:  She will continue current meds for now She will call psychiatry to make a follow up appt to see if she should continue these meds. ADA forms filled out Support offered Will continue to monitor  Return precautions discussed  Nicki Reaper, NP This visit occurred during the SARS-CoV-2 public health emergency.  Safety protocols were in place, including screening questions prior to the visit, additional usage of staff PPE, and extensive cleaning of exam room while observing appropriate contact time as indicated for disinfecting solutions.

## 2020-07-25 ENCOUNTER — Encounter: Payer: Self-pay | Admitting: Internal Medicine

## 2020-07-25 NOTE — Patient Instructions (Signed)
Dysmenorrhea Dysmenorrhea means painful cramps during your period (menstrual period). You will have pain in your lower belly (abdomen). The pain is caused by the tightening (contracting) of the muscles of the womb (uterus). The pain may be mild or very bad. With this condition, you may:  Have a headache.  Feel sick to your stomach (nauseous).  Throw up (vomit).  Have lower back pain. Follow these instructions at home: Helping pain and cramping   Put heat on your lower back or belly when you have pain or cramps. Use the heat source that your doctor tells you to use. ? Place a towel between your skin and the heat. ? Leave the heat on for 20-30 minutes. ? Remove the heat if your skin turns bright red. This is especially important if you cannot feel pain, heat, or cold. ? Do not have a heating pad on during sleep.  Do aerobic exercises. These include walking, swimming, or biking. These may help with cramps.  Massage your lower back or belly. This may help lessen pain. General instructions  Take over-the-counter and prescription medicines only as told by your doctor.  Do not drive or use heavy machinery while taking prescription pain medicine.  Avoid alcohol and caffeine during and right before your period. These can make cramps worse.  Do not use any products that have nicotine or tobacco. These include cigarettes and e-cigarettes. If you need help quitting, ask your doctor.  Keep all follow-up visits as told by your doctor. This is important. Contact a doctor if:  You have pain that gets worse.  You have pain that does not get better with medicine.  You have pain during sex.  You feel sick to your stomach or you throw up during your period, and medicine does not help. Get help right away if:  You pass out (faint). Summary  Dysmenorrhea means painful cramps during your period (menstrual period).  Put heat on your lower back or belly when you have pain or cramps.  Do  exercises like walking, swimming, or biking to help with cramps.  Contact a doctor if you have pain during sex. This information is not intended to replace advice given to you by your health care provider. Make sure you discuss any questions you have with your health care provider. Document Revised: 09/11/2017 Document Reviewed: 10/16/2016 Elsevier Patient Education  2020 Elsevier Inc.  

## 2020-09-17 ENCOUNTER — Other Ambulatory Visit: Payer: Self-pay | Admitting: Internal Medicine

## 2020-09-17 ENCOUNTER — Encounter: Payer: Self-pay | Admitting: Internal Medicine

## 2020-09-17 MED FILL — LO LOESTRIN FE 1-10 TABLET: 1 MG-10 MCG | 84 days supply | Qty: 84 | Fill #0

## 2020-09-28 ENCOUNTER — Encounter: Payer: Self-pay | Admitting: Internal Medicine

## 2020-10-01 ENCOUNTER — Other Ambulatory Visit: Payer: Self-pay

## 2020-10-02 ENCOUNTER — Other Ambulatory Visit: Payer: Self-pay | Admitting: Internal Medicine

## 2020-10-02 MED ORDER — ESCITALOPRAM OXALATE 20 MG PO TABS: 30.0000 mg | ORAL_TABLET | Freq: Every day | ORAL | 0 refills | Status: DC

## 2020-10-02 MED FILL — ESCITALOPRAM 20 MG TABLET: 20 | 90 days supply | Qty: 135 | Fill #0

## 2020-10-02 NOTE — Addendum Note (Signed)
Addended by: Lorre Munroe on: 10/02/2020 07:50 AM   Modules accepted: Orders

## 2020-11-15 ENCOUNTER — Ambulatory Visit: Payer: BC Managed Care – PPO | Admitting: Internal Medicine

## 2020-11-16 ENCOUNTER — Other Ambulatory Visit: Payer: Self-pay

## 2020-11-16 ENCOUNTER — Encounter: Payer: Self-pay | Admitting: Internal Medicine

## 2020-11-16 ENCOUNTER — Ambulatory Visit (INDEPENDENT_AMBULATORY_CARE_PROVIDER_SITE_OTHER): Payer: BC Managed Care – PPO | Admitting: Internal Medicine

## 2020-11-16 VITALS — BP 122/78 | HR 98 | Temp 97.6°F | Ht 69.75 in | Wt 140.0 lb

## 2020-11-16 DIAGNOSIS — F32A Depression, unspecified: Secondary | ICD-10-CM | POA: Diagnosis not present

## 2020-11-16 DIAGNOSIS — Z1159 Encounter for screening for other viral diseases: Secondary | ICD-10-CM

## 2020-11-16 DIAGNOSIS — F1094 Alcohol use, unspecified with alcohol-induced mood disorder: Secondary | ICD-10-CM | POA: Diagnosis not present

## 2020-11-16 DIAGNOSIS — Z23 Encounter for immunization: Secondary | ICD-10-CM

## 2020-11-16 DIAGNOSIS — Z0001 Encounter for general adult medical examination with abnormal findings: Secondary | ICD-10-CM | POA: Diagnosis not present

## 2020-11-16 DIAGNOSIS — F419 Anxiety disorder, unspecified: Secondary | ICD-10-CM

## 2020-11-16 MED ORDER — HYDROXYZINE HCL 25 MG PO TABS: 25.0000 mg | ORAL_TABLET | Freq: Every day | ORAL | 0 refills | Status: DC | PRN

## 2020-11-16 NOTE — Progress Notes (Signed)
Subjective:    Patient ID: Sherri Harris, female    DOB: 03/24/1996, 25 y.o.   MRN: [id number]  HPI  Patient presents the clinic today for her annual exam.  She is also due to follow-up chronic conditions.  Alcohol Abuse: She reports she relapsed a few weeks ago after a [place] an airport.  Prior to that she had been doing well.  Anxiety and Depression: Persistent, managed on Escitalopram.  She is not currently seeing a therapist.  She denies SI/HI.  Flu: 07/2019  Tetanus: 07/2019  Covid: [place] x 1 Pap Smear: 2020, [facility] Dentist: biannually  Diet: She does not eat meat.  She consumes fruits and vegetables daily.  She does eat some fried food.  She drinks mostly water, dt. Coke Exercise: None  Review of Systems      Past Medical History:  Diagnosis Date  . Anxiety   . Hypertension     Current Outpatient Medications  Medication Sig Dispense Refill  . acamprosate (CAMPRAL) 333 MG tablet Take 2 tablets (666 mg total) by mouth 3 (three) times daily with meals. 180 tablet 1  . chlordiazePOXIDE (LIBRIUM) 25 MG capsule 1 tab 4 times a day x 1 day, then 1 tab 3 times a day x 1 day, then 1 tab 2 times a day x 1 day, then 1 tab a day x 1 day 10 capsule 0  . escitalopram (LEXAPRO) 20 MG tablet Take 1.5 tablets (30 mg total) by mouth daily. 135 tablet 0  . gabapentin (NEURONTIN) 100 MG capsule Take 1 capsule (100 mg total) by mouth 3 (three) times daily. 90 capsule 1  . LO LOESTRIN FE 1 MG-10 MCG / 10 MCG tablet TAKE 1 TABLET BY MOUTH DAILY. 84 tablet 0   No current facility-administered medications for this visit.    Allergies  Allergen Reactions  . Amoxicillin Rash    REACTION: rash  . Penicillins Rash    Family History  Problem Relation Age of Onset  . Hypertension Mother   . Thyroid disease Mother   . Hypertension Maternal Aunt   . Hypertension Maternal Uncle   . Alcohol abuse Maternal Uncle   . Heart disease Maternal Grandmother   .  Cancer Maternal Grandmother        Breast  . Hypertension Father   . Cardiomyopathy Father   . Alcohol abuse Maternal Grandfather   . Alcohol abuse Paternal Uncle     Social History   Socioeconomic History  . Marital status: Single    Spouse name: Not on file  . Number of children: Not on file  . Years of education: Not on file  . Highest education level: Not on file  Occupational History  . Not on file  Tobacco Use  . Smoking status: Never Smoker  . Smokeless tobacco: Never Used  Vaping Use  . Vaping Use: Never used  Substance and Sexual Activity  . Alcohol use: Yes    Comment: bottle of wine/day   . Drug use: No  . Sexual activity: Yes    Birth control/protection: Pill  Other Topics Concern  . Not on file  Social History Narrative  . Not on file   Social Determinants of Health   Financial Resource Strain: Not on file  Food Insecurity: Not on file  Transportation Needs: Not on file  Physical Activity: Not on file  Stress: Not on file  Social Connections: Not on file  Intimate Partner Violence: Not on  file     Constitutional: Denies fever, malaise, fatigue, headache or abrupt weight changes.  HEENT: Denies eye pain, eye redness, ear pain, ringing in the ears, wax buildup, runny nose, nasal congestion, bloody nose, or sore throat. Respiratory: Denies difficulty breathing, shortness of breath, cough or sputum production.   Cardiovascular: Denies chest pain, chest tightness, palpitations or swelling in the hands or feet.  Gastrointestinal: Denies abdominal pain, bloating, constipation, diarrhea or blood in the stool.  GU: Denies urgency, frequency, pain with urination, burning sensation, blood in urine, odor or discharge. Musculoskeletal: Denies decrease in range of motion, difficulty with gait, muscle pain or joint pain and swelling.  Skin: Denies redness, rashes, lesions or ulcercations.  Neurological: Denies dizziness, difficulty with memory, difficulty with  speech or problems with balance and coordination.  Psych: Patient has a history of anxiety and depression.  Denies SI/HI.  No other specific complaints in a complete review of systems (except as listed in HPI above).  Objective:   Physical Exam  BP 122/78   Pulse 98   Temp 97.6 F (36.4 C) (Temporal)   Ht 5' 9.75" (1.772 m)   Wt 140 lb (63.5 kg)   SpO2 98%   BMI 20.23 kg/m   Wt Readings from Last 3 Encounters:  07/24/20 163 lb (73.9 kg)  06/24/20 145 lb (65.8 kg)  04/20/20 150 lb (68 kg)    General: Appears her stated age, well developed, well nourished in NAD. Skin: Warm, dry and intact. No rashes, lesions or ulcerations noted. HEENT: Head: normal shape and size; Eyes: sclera white, no icterus, conjunctiva pink, PERRLA and EOMs intact; Neck:  Neck supple, trachea midline. No masses, lumps or thyromegaly present.  Cardiovascular: Normal rate and rhythm. S1,S2 noted.  No murmur, rubs or gallops noted. No JVD or BLE edema.  Pulmonary/Chest: Normal effort and positive vesicular breath sounds. No respiratory distress. No wheezes, rales or ronchi noted.  Abdomen: Soft and nontender. Normal bowel sounds. No distention or masses noted. Liver, spleen and kidneys non palpable. Musculoskeletal: Strength 5/5 BUE/BLE.  No difficulty with gait.  Neurological: Alert and oriented. Cranial nerves II-XII grossly intact. Coordination normal.  Psychiatric: Mood and affect normal. Behavior is normal. Judgment and thought content normal.    BMET    Component Value Date/Time   NA 136 04/20/2020 1216   NA 138 01/22/2018 1012   K 3.9 04/20/2020 1216   CL 97 (L) 04/20/2020 1216   CO2 23 04/20/2020 1216   GLUCOSE 121 (H) 04/20/2020 1216   BUN 10 04/20/2020 1216   BUN 9 01/22/2018 1012   CREATININE 1.05 (H) 04/20/2020 1216   CALCIUM 9.5 04/20/2020 1216   GFRNONAA >60 04/20/2020 1216   GFRAA >60 04/20/2020 1216    Lipid Panel     Component Value Date/Time   CHOL 190 07/18/2019 1535   TRIG  160.0 (H) 07/18/2019 1535   HDL 53.10 07/18/2019 1535   CHOLHDL 4 07/18/2019 1535   VLDL 32.0 07/18/2019 1535   LDLCALC 105 (H) 07/18/2019 1535    CBC    Component Value Date/Time   WBC 11.6 (H) 04/20/2020 1216   RBC 4.89 04/20/2020 1216   HGB 14.9 04/20/2020 1216   HGB 14.5 01/22/2018 1012   HCT 42.8 04/20/2020 1216   HCT 42.2 01/22/2018 1012   PLT 381 04/20/2020 1216   PLT 337 01/22/2018 1012   MCV 87.5 04/20/2020 1216   MCV 90 01/22/2018 1012   MCH 30.5 04/20/2020 1216   MCHC 34.8  04/20/2020 1216   RDW 12.4 04/20/2020 1216   RDW 12.4 01/22/2018 1012   LYMPHSABS 0.8 04/20/2020 1216   MONOABS 0.3 04/20/2020 1216   EOSABS 0.0 04/20/2020 1216   BASOSABS 0.1 04/20/2020 1216    Hgb A1C No results found for: HGBA1C          Assessment & Plan:   Preventative health maintenance:  Flu shot today Tetanus UTD Encouraged her to finish out her Covid series Pap smear UTD Encouraged her to consume a balanced diet and exercise regimen Advised her to see a dentist annually We will check CBC, c-Met, lipid, and hep C today  RTC in 1 year, sooner if needed  Webb Silversmith, NP This visit occurred during the SARS-CoV-2 public health emergency.  Safety protocols were in place, including screening questions prior to the visit, additional usage of staff PPE, and extensive cleaning of exam room while observing appropriate contact time as indicated for disinfecting solutions.

## 2020-11-19 ENCOUNTER — Encounter: Payer: Self-pay | Admitting: Internal Medicine

## 2020-11-19 LAB — COMPREHENSIVE METABOLIC PANEL
AG Ratio: 1.8 (calc) (ref 1.0–2.5)
ALT: 88 U/L — ABNORMAL HIGH (ref 6–29)
AST: 38 U/L — ABNORMAL HIGH (ref 10–30)
Albumin: 4.1 g/dL (ref 3.6–5.1)
Alkaline phosphatase (APISO): 67 U/L (ref 31–125)
BUN: 11 mg/dL (ref 7–25)
CO2: 25 mmol/L (ref 20–32)
Calcium: 9.8 mg/dL (ref 8.6–10.2)
Chloride: 107 mmol/L (ref 98–110)
Creat: 0.79 mg/dL (ref 0.50–1.10)
Globulin: 2.3 g/dL (calc) (ref 1.9–3.7)
Glucose, Bld: 78 mg/dL (ref 65–99)
Potassium: 4.5 mmol/L (ref 3.5–5.3)
Sodium: 142 mmol/L (ref 135–146)
Total Bilirubin: 0.4 mg/dL (ref 0.2–1.2)
Total Protein: 6.4 g/dL (ref 6.1–8.1)

## 2020-11-19 LAB — CBC
HCT: 39.4 % (ref 35.0–45.0)
Hemoglobin: 13.4 g/dL (ref 11.7–15.5)
MCH: 30.7 pg (ref 27.0–33.0)
MCHC: 34 g/dL (ref 32.0–36.0)
MCV: 90.4 fL (ref 80.0–100.0)
MPV: 9.4 fL (ref 7.5–12.5)
Platelets: 568 10*3/uL — ABNORMAL HIGH (ref 140–400)
RBC: 4.36 10*6/uL (ref 3.80–5.10)
RDW: 12.9 % (ref 11.0–15.0)
WBC: 8.2 10*3/uL (ref 3.8–10.8)

## 2020-11-19 LAB — LIPID PANEL
Cholesterol: 185 mg/dL (ref <200)
HDL: 70 mg/dL (ref >=50)
LDL Cholesterol (Calc): 89 mg/dL (calc)
Non-HDL Cholesterol (Calc): 115 mg/dL (calc) (ref <130)
Total CHOL/HDL Ratio: 2.6 (calc) (ref <5.0)
Triglycerides: 164 mg/dL — ABNORMAL HIGH (ref <150)

## 2020-11-19 LAB — HEPATITIS C ANTIBODY
Hepatitis C Ab: NONREACTIVE
SIGNAL TO CUT-OFF: 0.01 (ref <1.00)

## 2020-11-19 NOTE — Assessment & Plan Note (Addendum)
Continue Escitalopram Rx for Hydroxyzine 25 mg daily as needed for breakthrough Support offered We will monitor 

## 2020-11-19 NOTE — Assessment & Plan Note (Signed)
Continue Escitalopram Rx for Hydroxyzine 25 mg daily as needed for breakthrough Support offered We will monitor

## 2020-11-19 NOTE — Patient Instructions (Signed)
Health Maintenance, Female Adopting a healthy lifestyle and getting preventive care are important in promoting health and wellness. Ask your health care provider about:  The right schedule for you to have regular tests and exams.  Things you can do on your own to prevent diseases and keep yourself healthy. What should I know about diet, weight, and exercise? Eat a healthy diet  Eat a diet that includes plenty of vegetables, fruits, low-fat dairy products, and lean protein.  Do not eat a lot of foods that are high in solid fats, added sugars, or sodium.   Maintain a healthy weight Body mass index (BMI) is used to identify weight problems. It estimates body fat based on height and weight. Your health care provider can help determine your BMI and help you achieve or maintain a healthy weight. Get regular exercise Get regular exercise. This is one of the most important things you can do for your health. Most adults should:  Exercise for at least 150 minutes each week. The exercise should increase your heart rate and make you sweat (moderate-intensity exercise).  Do strengthening exercises at least twice a week. This is in addition to the moderate-intensity exercise.  Spend less time sitting. Even light physical activity can be beneficial. Watch cholesterol and blood lipids Have your blood tested for lipids and cholesterol at 27 years of age, then have this test every 5 years. Have your cholesterol levels checked more often if:  Your lipid or cholesterol levels are high.  You are older than 27 years of age.  You are at high risk for heart disease. What should I know about cancer screening? Depending on your health history and family history, you may need to have cancer screening at various ages. This may include screening for:  Breast cancer.  Cervical cancer.  Colorectal cancer.  Skin cancer.  Lung cancer. What should I know about heart disease, diabetes, and high blood  pressure? Blood pressure and heart disease  High blood pressure causes heart disease and increases the risk of stroke. This is more likely to develop in people who have high blood pressure readings, are of African descent, or are overweight.  Have your blood pressure checked: ? Every 3-5 years if you are 18-39 years of age. ? Every year if you are 40 years old or older. Diabetes Have regular diabetes screenings. This checks your fasting blood sugar level. Have the screening done:  Once every three years after age 40 if you are at a normal weight and have a low risk for diabetes.  More often and at a younger age if you are overweight or have a high risk for diabetes. What should I know about preventing infection? Hepatitis B If you have a higher risk for hepatitis B, you should be screened for this virus. Talk with your health care provider to find out if you are at risk for hepatitis B infection. Hepatitis C Testing is recommended for:  Everyone born from 1945 through 1965.  Anyone with known risk factors for hepatitis C. Sexually transmitted infections (STIs)  Get screened for STIs, including gonorrhea and chlamydia, if: ? You are sexually active and are younger than 27 years of age. ? You are older than 27 years of age and your health care provider tells you that you are at risk for this type of infection. ? Your sexual activity has changed since you were last screened, and you are at increased risk for chlamydia or gonorrhea. Ask your health care provider   if you are at risk.  Ask your health care provider about whether you are at high risk for HIV. Your health care provider may recommend a prescription medicine to help prevent HIV infection. If you choose to take medicine to prevent HIV, you should first get tested for HIV. You should then be tested every 3 months for as long as you are taking the medicine. Pregnancy  If you are about to stop having your period (premenopausal) and  you may become pregnant, seek counseling before you get pregnant.  Take 400 to 800 micrograms (mcg) of folic acid every day if you become pregnant.  Ask for birth control (contraception) if you want to prevent pregnancy. Osteoporosis and menopause Osteoporosis is a disease in which the bones lose minerals and strength with aging. This can result in bone fractures. If you are 65 years old or older, or if you are at risk for osteoporosis and fractures, ask your health care provider if you should:  Be screened for bone loss.  Take a calcium or vitamin D supplement to lower your risk of fractures.  Be given hormone replacement therapy (HRT) to treat symptoms of menopause. Follow these instructions at home: Lifestyle  Do not use any products that contain nicotine or tobacco, such as cigarettes, e-cigarettes, and chewing tobacco. If you need help quitting, ask your health care provider.  Do not use street drugs.  Do not share needles.  Ask your health care provider for help if you need support or information about quitting drugs. Alcohol use  Do not drink alcohol if: ? Your health care provider tells you not to drink. ? You are pregnant, may be pregnant, or are planning to become pregnant.  If you drink alcohol: ? Limit how much you use to 0-1 drink a day. ? Limit intake if you are breastfeeding.  Be aware of how much alcohol is in your drink. In the [place] one drink equals one 12 oz bottle of beer (355 mL), one 5 oz glass of wine (148 mL), or one 1 oz glass of hard liquor (44 mL). General instructions  Schedule regular health, dental, and eye exams.  Stay current with your vaccines.  Tell your health care provider if: ? You often feel depressed. ? You have ever been abused or do not feel safe at home. Summary  Adopting a healthy lifestyle and getting preventive care are important in promoting health and wellness.  Follow your health care provider's instructions about healthy  diet, exercising, and getting tested or screened for diseases.  Follow your health care provider's instructions on monitoring your cholesterol and blood pressure. This information is not intended to replace advice given to you by your health care provider. Make sure you discuss any questions you have with your health care provider. Document Revised: 09/22/2018 Document Reviewed: 09/22/2018 Elsevier Patient Education  2021 Elsevier Inc.  

## 2020-11-20 ENCOUNTER — Encounter: Payer: Self-pay | Admitting: Internal Medicine

## 2020-12-06 ENCOUNTER — Encounter: Payer: Self-pay | Admitting: Internal Medicine

## 2020-12-06 ENCOUNTER — Ambulatory Visit (INDEPENDENT_AMBULATORY_CARE_PROVIDER_SITE_OTHER): Payer: BC Managed Care – PPO | Admitting: Psychology

## 2020-12-06 DIAGNOSIS — F411 Generalized anxiety disorder: Secondary | ICD-10-CM

## 2020-12-06 NOTE — Telephone Encounter (Signed)
Rene Kocher will be back on Monday---I think she should wait till then. Please send to her then

## 2020-12-06 NOTE — Telephone Encounter (Signed)
I misread the schedule---but I am still uncomfortable with changing a maintenance depression medication for her---see if she can wait till Wednesday. (I am not that excited about venlafaxine---lots of rebound with late or missed doses)

## 2020-12-10 DIAGNOSIS — F411 Generalized anxiety disorder: Secondary | ICD-10-CM | POA: Diagnosis not present

## 2020-12-10 DIAGNOSIS — F1020 Alcohol dependence, uncomplicated: Secondary | ICD-10-CM | POA: Diagnosis not present

## 2020-12-14 ENCOUNTER — Other Ambulatory Visit: Payer: Self-pay

## 2020-12-14 ENCOUNTER — Telehealth: Payer: BC Managed Care – PPO | Admitting: Internal Medicine

## 2020-12-17 DIAGNOSIS — F411 Generalized anxiety disorder: Secondary | ICD-10-CM | POA: Diagnosis not present

## 2020-12-17 DIAGNOSIS — F1020 Alcohol dependence, uncomplicated: Secondary | ICD-10-CM | POA: Diagnosis not present

## 2020-12-18 ENCOUNTER — Other Ambulatory Visit: Payer: Self-pay

## 2020-12-18 ENCOUNTER — Telehealth (INDEPENDENT_AMBULATORY_CARE_PROVIDER_SITE_OTHER): Payer: BC Managed Care – PPO | Admitting: Internal Medicine

## 2020-12-18 ENCOUNTER — Encounter: Payer: Self-pay | Admitting: Internal Medicine

## 2020-12-18 DIAGNOSIS — F32A Depression, unspecified: Secondary | ICD-10-CM | POA: Diagnosis not present

## 2020-12-18 DIAGNOSIS — F419 Anxiety disorder, unspecified: Secondary | ICD-10-CM

## 2020-12-18 MED ORDER — BUPROPION HCL ER (XL) 150 MG PO TB24
150.0000 mg | ORAL_TABLET | Freq: Every day | ORAL | 2 refills | Status: DC
Start: 2020-12-18 — End: 2021-02-04

## 2020-12-18 MED ORDER — ESCITALOPRAM OXALATE 20 MG PO TABS: 20.0000 mg | ORAL_TABLET | Freq: Every day | ORAL | 1 refills | Status: DC

## 2020-12-18 NOTE — Assessment & Plan Note (Signed)
Persistent Continue Escitalopram 20 mg daily Will add Wellbutrin 150 mg XL daily Support offered She will follow up with psych  Update me in 1 month and let me know how yall are doing

## 2020-12-18 NOTE — Patient Instructions (Signed)
[email or web address]">  Generalized Anxiety Disorder, Adult Generalized anxiety disorder (GAD) is a mental health condition. Unlike normal worries, anxiety related to GAD is not triggered by a specific event. These worries do not fade or get better with time. GAD interferes with relationships, work, and school. GAD symptoms can vary from mild to severe. People with severe GAD can have intense waves of anxiety with physical symptoms that are similar to panic attacks. What are the causes? The exact cause of GAD is not known, but the following are believed to have an impact:  Differences in natural brain chemicals.  Genes passed down from parents to children.  Differences in the way threats are perceived.  Development during childhood.  Personality. What increases the risk? The following factors may make you more likely to develop this condition:  Being female.  Having a family history of anxiety disorders.  Being very shy.  Experiencing very stressful life events, such as the death of a loved one.  Having a very stressful family environment. What are the signs or symptoms? People with GAD often worry excessively about many things in their lives, such as their health and family. Symptoms may also include:  Mental and emotional symptoms: ? Worrying excessively about natural disasters. ? Fear of being late. ? Difficulty concentrating. ? Fears that others are judging your performance.  Physical symptoms: ? Fatigue. ? Headaches, muscle tension, muscle twitches, trembling, or feeling shaky. ? Feeling like your heart is pounding or beating very fast. ? Feeling out of breath or like you cannot take a deep breath. ? Having trouble falling asleep or staying asleep, or experiencing restlessness. ? Sweating. ? Nausea, diarrhea, or irritable bowel syndrome (IBS).  Behavioral symptoms: ? Experiencing erratic moods or irritability. ? Avoidance of new situations. ? Avoidance of  people. ? Extreme difficulty making decisions. How is this diagnosed? This condition is diagnosed based on your symptoms and medical history. You will also have a physical exam. Your health care provider may perform tests to rule out other possible causes of your symptoms. To be diagnosed with GAD, a person must have anxiety that:  Is out of his or her control.  Affects several different aspects of his or her life, such as work and relationships.  Causes distress that makes him or her unable to take part in normal activities.  Includes at least three symptoms of GAD, such as restlessness, fatigue, trouble concentrating, irritability, muscle tension, or sleep problems. Before your health care provider can confirm a diagnosis of GAD, these symptoms must be present more days than they are not, and they must last for 6 months or longer. How is this treated? This condition may be treated with:  Medicine. Antidepressant medicine is usually prescribed for long-term daily control. Anti-anxiety medicines may be added in severe cases, especially when panic attacks occur.  Talk therapy (psychotherapy). Certain types of talk therapy can be helpful in treating GAD by providing support, education, and guidance. Options include: ? Cognitive behavioral therapy (CBT). People learn coping skills and self-calming techniques to ease their physical symptoms. They learn to identify unrealistic thoughts and behaviors and to replace them with more appropriate thoughts and behaviors. ? Acceptance and commitment therapy (ACT). This treatment teaches people how to be mindful as a way to cope with unwanted thoughts and feelings. ? Biofeedback. This process trains you to manage your body's response (physiological response) through breathing techniques and relaxation methods. You will work with a therapist while machines are used to monitor your physical   symptoms.  Stress management techniques. These include yoga,  meditation, and exercise. A mental health specialist can help determine which treatment is best for you. Some people see improvement with one type of therapy. However, other people require a combination of therapies.   Follow these instructions at home: Lifestyle  Maintain a consistent routine and schedule.  Anticipate stressful situations. Create a plan, and allow extra time to work with your plan.  Practice stress management or self-calming techniques that you have learned from your therapist or your health care provider. General instructions  Take over-the-counter and prescription medicines only as told by your health care provider.  Understand that you are likely to have setbacks. Accept this and be kind to yourself as you persist to take better care of yourself.  Recognize and accept your accomplishments, even if you judge them as small.  Keep all follow-up visits as told by your health care provider. This is important. Contact a health care provider if:  Your symptoms do not get better.  Your symptoms get worse.  You have signs of depression, such as: ? A persistently sad or irritable mood. ? Loss of enjoyment in activities that used to bring you joy. ? Change in weight or eating. ? Changes in sleeping habits. ? Avoiding friends or family members. ? Loss of energy for normal tasks. ? Feelings of guilt or worthlessness. Get help right away if:  You have serious thoughts about hurting yourself or others. If you ever feel like you may hurt yourself or others, or have thoughts about taking your own life, get help right away. Go to your nearest emergency department or:  Call your local emergency services (911 in the [place]  Call a suicide crisis helpline, such as the National Suicide Prevention Lifeline at 1-800-273-8255. This is open 24 hours a day in the [place]  Text the Crisis Text Line at 741741 (in the [place] Summary  Generalized anxiety disorder (GAD) is a mental  health condition that involves worry that is not triggered by a specific event.  People with GAD often worry excessively about many things in their lives, such as their health and family.  GAD may cause symptoms such as restlessness, trouble concentrating, sleep problems, frequent sweating, nausea, diarrhea, headaches, and trembling or muscle twitching.  A mental health specialist can help determine which treatment is best for you. Some people see improvement with one type of therapy. However, other people require a combination of therapies. This information is not intended to replace advice given to you by your health care provider. Make sure you discuss any questions you have with your health care provider. Document Revised: 07/20/2019 Document Reviewed: 07/20/2019 Elsevier Patient Education  2021 Elsevier Inc.  

## 2020-12-18 NOTE — Progress Notes (Signed)
Virtual Visit via Video Note  I connected with Sherri Harris on 12/18/20 at 12:15 PM EST by a video enabled telemedicine application and verified that I am speaking with the correct person using two identifiers.  Location: Patient: Home Provider: Office  Persons participating in this video call: Nicki Reaper, NP and [name]   I discussed the limitations of evaluation and management by telemedicine and the availability of in person appointments. The patient expressed understanding and agreed to proceed.  History of Present Illness:  Patient wanting to follow-up, anxiety and depression.  This is currently managed on Citalopram and Hydroxyzine which she feels is no longer effective.  She also reports the Hydroxyzine makes her very sleepy.  She has made an appointment with a psychiatrist but is unable to get in until April/May. She denies SI/HI.    Past Medical History:  Diagnosis Date  . Anxiety   . Hypertension     Current Outpatient Medications  Medication Sig Dispense Refill  . escitalopram (LEXAPRO) 20 MG tablet Take 1.5 tablets (30 mg total) by mouth daily. 135 tablet 0  . hydrOXYzine (ATARAX/VISTARIL) 25 MG tablet Take 1 tablet (25 mg total) by mouth daily as needed. 30 tablet 0  . LO LOESTRIN FE 1 MG-10 MCG / 10 MCG tablet TAKE 1 TABLET BY MOUTH DAILY. 84 tablet 0   No current facility-administered medications for this visit.    Allergies  Allergen Reactions  . Amoxicillin Rash    REACTION: rash  . Penicillins Rash    Family History  Problem Relation Age of Onset  . Hypertension Mother   . Thyroid disease Mother   . Hypertension Maternal Aunt   . Hypertension Maternal Uncle   . Alcohol abuse Maternal Uncle   . Heart disease Maternal Grandmother   . Cancer Maternal Grandmother        Breast  . Hypertension Father   . Cardiomyopathy Father   . Alcohol abuse Maternal Grandfather   . Alcohol abuse Paternal Uncle     Social History   Socioeconomic  History  . Marital status: Single    Spouse name: Not on file  . Number of children: Not on file  . Years of education: Not on file  . Highest education level: Not on file  Occupational History  . Not on file  Tobacco Use  . Smoking status: Never Smoker  . Smokeless tobacco: Never Used  Vaping Use  . Vaping Use: Never used  Substance and Sexual Activity  . Alcohol use: Yes    Comment: bottle of wine/day   . Drug use: No  . Sexual activity: Yes    Birth control/protection: Pill  Other Topics Concern  . Not on file  Social History Narrative  . Not on file   Social Determinants of Health   Financial Resource Strain: Not on file  Food Insecurity: Not on file  Transportation Needs: Not on file  Physical Activity: Not on file  Stress: Not on file  Social Connections: Not on file  Intimate Partner Violence: Not on file     Constitutional: Denies fever, malaise, fatigue, headache or abrupt weight changes.  Respiratory: Denies difficulty breathing, shortness of breath, cough or sputum production.   Cardiovascular: Denies chest pain, chest tightness, palpitations or swelling in the hands or feet.  Neurological: Denies dizziness, difficulty with memory, difficulty with speech or problems with balance and coordination.  Psych: Patient has a history of anxiety and depression.  Denies SI/HI.  No other  specific complaints in a complete review of systems (except as listed in HPI above).  Observations/Objective:   Wt Readings from Last 3 Encounters:  11/16/20 140 lb (63.5 kg)  07/24/20 163 lb (73.9 kg)  06/24/20 145 lb (65.8 kg)    General: Appears her stated age, well developed, well nourished in NAD. Pulmonary/Chest: Normal effort. No respiratory distress.  Neurological: Alert and oriented. Psychiatric: Mood and affect flat. Behavior is normal. Judgment and thought content normal.     BMET    Component Value Date/Time   NA 142 11/16/2020 1610   NA 138 01/22/2018 1012    K 4.5 11/16/2020 1610   CL 107 11/16/2020 1610   CO2 25 11/16/2020 1610   GLUCOSE 78 11/16/2020 1610   BUN 11 11/16/2020 1610   BUN 9 01/22/2018 1012   CREATININE 0.79 11/16/2020 1610   CALCIUM 9.8 11/16/2020 1610   GFRNONAA >60 04/20/2020 1216   GFRAA >60 04/20/2020 1216    Lipid Panel     Component Value Date/Time   CHOL 185 11/16/2020 1610   TRIG 164 (H) 11/16/2020 1610   HDL 70 11/16/2020 1610   CHOLHDL 2.6 11/16/2020 1610   VLDL 32.0 07/18/2019 1535   LDLCALC 89 11/16/2020 1610    CBC    Component Value Date/Time   WBC 8.2 11/16/2020 1610   RBC 4.36 11/16/2020 1610   HGB 13.4 11/16/2020 1610   HGB 14.5 01/22/2018 1012   HCT 39.4 11/16/2020 1610   HCT 42.2 01/22/2018 1012   PLT 568 (H) 11/16/2020 1610   PLT 337 01/22/2018 1012   MCV 90.4 11/16/2020 1610   MCV 90 01/22/2018 1012   MCH 30.7 11/16/2020 1610   MCHC 34.0 11/16/2020 1610   RDW 12.9 11/16/2020 1610   RDW 12.4 01/22/2018 1012   LYMPHSABS 0.8 04/20/2020 1216   MONOABS 0.3 04/20/2020 1216   EOSABS 0.0 04/20/2020 1216   BASOSABS 0.1 04/20/2020 1216    Hgb A1C No results found for: HGBA1C     Assessment and Plan:   Follow Up Instructions:    I discussed the assessment and treatment plan with the patient. The patient was provided an opportunity to ask questions and all were answered. The patient agreed with the plan and demonstrated an understanding of the instructions.   The patient was advised to call back or seek an in-person evaluation if the symptoms worsen or if the condition fails to improve as anticipated.    Nicki Reaper, NP

## 2020-12-24 ENCOUNTER — Encounter: Payer: BC Managed Care – PPO | Admitting: Internal Medicine

## 2020-12-25 ENCOUNTER — Other Ambulatory Visit: Payer: Self-pay | Admitting: Internal Medicine

## 2020-12-26 NOTE — Telephone Encounter (Signed)
She does not take this. It makes her too drowsy.

## 2021-01-03 ENCOUNTER — Other Ambulatory Visit (HOSPITAL_BASED_OUTPATIENT_CLINIC_OR_DEPARTMENT_OTHER): Payer: Self-pay

## 2021-01-04 ENCOUNTER — Ambulatory Visit: Payer: BC Managed Care – PPO | Admitting: Internal Medicine

## 2021-01-04 DIAGNOSIS — Z0289 Encounter for other administrative examinations: Secondary | ICD-10-CM

## 2021-01-23 ENCOUNTER — Encounter: Payer: Self-pay | Admitting: Internal Medicine

## 2021-01-23 MED ORDER — NORETHIN-ETH ESTRAD-FE BIPHAS 1 MG-10 MCG / 10 MCG PO TABS: 1.0000 | ORAL_TABLET | Freq: Every day | ORAL | 2 refills | Status: DC

## 2021-02-02 NOTE — Telephone Encounter (Signed)
Last OV:  Persistent Continue Escitalopram 20 mg daily Will add Wellbutrin 150 mg XL daily Support offered She will follow up with psych   Update me in 1 month and let me know how yall are doing

## 2021-02-04 MED ORDER — BUPROPION HCL ER (XL) 150 MG PO TB24: 150.0000 mg | ORAL_TABLET | Freq: Every day | ORAL | 1 refills | Status: DC

## 2021-02-04 NOTE — Addendum Note (Signed)
Addended by: Roena Malady on: 02/04/2021 10:05 AM   Modules accepted: Orders

## 2021-02-13 ENCOUNTER — Other Ambulatory Visit: Payer: Self-pay

## 2021-02-13 ENCOUNTER — Emergency Department
Admission: EM | Admit: 2021-02-13 | Discharge: 2021-02-14 | Disposition: A | Discharge status: Home / Self Care | Payer: BC Managed Care – PPO | Attending: Emergency Medicine | Admitting: Emergency Medicine

## 2021-02-13 DIAGNOSIS — I10 Essential (primary) hypertension: Secondary | ICD-10-CM | POA: Insufficient documentation

## 2021-02-13 DIAGNOSIS — Z20822 Contact with and (suspected) exposure to covid-19: Secondary | ICD-10-CM | POA: Diagnosis not present

## 2021-02-13 DIAGNOSIS — F1024 Alcohol dependence with alcohol-induced mood disorder: Secondary | ICD-10-CM | POA: Diagnosis not present

## 2021-02-13 DIAGNOSIS — Y908 Blood alcohol level of 240 mg/100 ml or more: Secondary | ICD-10-CM | POA: Insufficient documentation

## 2021-02-13 DIAGNOSIS — Z046 Encounter for general psychiatric examination, requested by authority: Secondary | ICD-10-CM | POA: Diagnosis not present

## 2021-02-13 DIAGNOSIS — F10129 Alcohol abuse with intoxication, unspecified: Secondary | ICD-10-CM | POA: Diagnosis not present

## 2021-02-13 DIAGNOSIS — F1094 Alcohol use, unspecified with alcohol-induced mood disorder: Secondary | ICD-10-CM | POA: Diagnosis present

## 2021-02-13 DIAGNOSIS — F1020 Alcohol dependence, uncomplicated: Secondary | ICD-10-CM

## 2021-02-13 LAB — URINE DRUG SCREEN, QUALITATIVE (ARMC ONLY)
Amphetamines, Ur Screen: NOT DETECTED
Barbiturates, Ur Screen: NOT DETECTED
Benzodiazepine, Ur Scrn: NOT DETECTED
Cannabinoid 50 Ng, Ur ~~LOC~~: NOT DETECTED
Cocaine Metabolite,Ur ~~LOC~~: NOT DETECTED
MDMA (Ecstasy)Ur Screen: NOT DETECTED
Methadone Scn, Ur: NOT DETECTED
Opiate, Ur Screen: NOT DETECTED
Phencyclidine (PCP) Ur S: NOT DETECTED
Tricyclic, Ur Screen: NOT DETECTED

## 2021-02-13 LAB — COMPREHENSIVE METABOLIC PANEL
ALT: 26 U/L (ref 0–44)
AST: 31 U/L (ref 15–41)
Albumin: 4.7 g/dL (ref 3.5–5.0)
Alkaline Phosphatase: 61 U/L (ref 38–126)
Anion gap: 16 — ABNORMAL HIGH (ref 5–15)
BUN: 13 mg/dL (ref 6–20)
CO2: 22 mmol/L (ref 22–32)
Calcium: 8.9 mg/dL (ref 8.9–10.3)
Chloride: 102 mmol/L (ref 98–111)
Creatinine, Ser: 1.06 mg/dL — ABNORMAL HIGH (ref 0.44–1.00)
GFR, Estimated: >60 mL/min (ref >60)
Glucose, Bld: 104 mg/dL — ABNORMAL HIGH (ref 70–99)
Potassium: 3.8 mmol/L (ref 3.5–5.1)
Sodium: 140 mmol/L (ref 135–145)
Total Bilirubin: 0.7 mg/dL (ref 0.3–1.2)
Total Protein: 8 g/dL (ref 6.5–8.1)

## 2021-02-13 LAB — CBC
HCT: 45.9 % (ref 36.0–46.0)
Hemoglobin: 16 g/dL — ABNORMAL HIGH (ref 12.0–15.0)
MCH: 31.5 pg (ref 26.0–34.0)
MCHC: 34.9 g/dL (ref 30.0–36.0)
MCV: 90.4 fL (ref 80.0–100.0)
Platelets: 497 10*3/uL — ABNORMAL HIGH (ref 150–400)
RBC: 5.08 MIL/uL (ref 3.87–5.11)
RDW: 13.2 % (ref 11.5–15.5)
WBC: 8.7 10*3/uL (ref 4.0–10.5)
nRBC: 0 % (ref 0.0–0.2)

## 2021-02-13 LAB — RESP PANEL BY RT-PCR (FLU A&B, COVID) ARPGX2
Influenza A by PCR: NEGATIVE
Influenza B by PCR: NEGATIVE
SARS Coronavirus 2 by RT PCR: NEGATIVE

## 2021-02-13 LAB — LIPASE, BLOOD: Lipase: 36 U/L (ref 11–51)

## 2021-02-13 LAB — ETHANOL: Alcohol, Ethyl (B): 395 mg/dL (ref <10)

## 2021-02-13 LAB — POC URINE PREG, ED: Preg Test, Ur: NEGATIVE

## 2021-02-13 MED ORDER — NICOTINE 21 MG/24HR TD PT24
21.0000 mg | MEDICATED_PATCH | Freq: Once | TRANSDERMAL | Status: DC
  Administered 2021-02-13: 21 mg via TRANSDERMAL
  Filled 2021-02-13: qty 1

## 2021-02-13 MED ORDER — LORAZEPAM 2 MG PO TABS: 0.0000 mg | ORAL_TABLET | Freq: Two times a day (BID) | ORAL | Status: DC

## 2021-02-13 MED ORDER — LORAZEPAM 2 MG PO TABS
2.0000 mg | ORAL_TABLET | Freq: Once | ORAL | Status: AC
  Administered 2021-02-13: 2 mg via ORAL
  Filled 2021-02-13: qty 1

## 2021-02-13 MED ORDER — LORAZEPAM 2 MG PO TABS
0.0000 mg | ORAL_TABLET | Freq: Four times a day (QID) | ORAL | Status: DC
  Administered 2021-02-14: 2 mg via ORAL
  Filled 2021-02-13: qty 1

## 2021-02-13 MED ORDER — THIAMINE HCL 100 MG PO TABS
100.0000 mg | ORAL_TABLET | Freq: Every day | ORAL | Status: DC
  Administered 2021-02-13 – 2021-02-14 (×2): 100 mg via ORAL
  Filled 2021-02-13 (×2): qty 1

## 2021-02-13 MED ORDER — THIAMINE HCL 100 MG/ML IJ SOLN: 100.0000 mg | Freq: Every day | INTRAMUSCULAR | Status: DC

## 2021-02-13 MED ORDER — LORAZEPAM 2 MG/ML IJ SOLN: 0.0000 mg | Freq: Four times a day (QID) | INTRAMUSCULAR | Status: DC

## 2021-02-13 MED ORDER — LORAZEPAM 2 MG/ML IJ SOLN: 0.0000 mg | Freq: Two times a day (BID) | INTRAMUSCULAR | Status: DC

## 2021-02-13 NOTE — ED Notes (Signed)
Pt provided with snacks and drinks now.

## 2021-02-13 NOTE — ED Notes (Signed)
Hourly rounding performed, patient currently awake in room. Patient has no complaints at this time. Q15 minute rounds and monitoring via Rover and Officer to continue. 

## 2021-02-13 NOTE — ED Notes (Signed)
Pt belongings include pink sandals, black phone turned off, black sports bra, two white socks, one vape, one gray pair shorts, black tshirt, stuffed animal.

## 2021-02-13 NOTE — ED Notes (Signed)
SOC CALLED (RACHEL) @19 :52

## 2021-02-13 NOTE — ED Notes (Signed)
Pt is agitated and upset about being here, pt updated that Dr. Lenard Lance has placed her under IVC. PT requires lots of communication and talking with to help relax and cooperate more. Pt does not threaten or act out, is just upset that she is here and wants to leave. Pt states she is anxious and needs help and needs her vape. See orders to come.

## 2021-02-13 NOTE — ED Provider Notes (Signed)
[facility] Emergency Department Provider Note  Time seen: 4:34 PM  I have reviewed the triage vital signs and the nursing notes.   HISTORY  Chief Complaint Psychiatric Evaluation   HPI Sherri Harris is a 25 y.o. female with a past medical history anxiety, hypertension, alcohol use, presents to the emergency department with alcohol intoxication.  According to the patient she had been through rehab and has been sober for the last 2 months, relapsed last night and is drinking approximately 1 pint of liquor today.  When asked why she is drinking patient states its "because I am an alcoholic."  Patient denies any thoughts of hurting herself or anyone else.  Per report patient's mother is taking out IVC paperwork on the patient because she is concerned that she is drinking to hurt herself.  Patient adamantly denies this.  Patient denies any drug use.  Patient has no medical complaints.   Past Medical History:  Diagnosis Date  . Anxiety   . Hypertension     Patient Active Problem List   Diagnosis Date Noted  . 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 Anxiety and depression 10/14/2018    Past Surgical History:  Procedure Laterality Date  . TYMPANOSTOMY TUBE PLACEMENT    . WISDOM TOOTH EXTRACTION      Prior to Admission medications   Medication Sig Start Date End Date Taking? Authorizing Provider  buPROPion (WELLBUTRIN XL) 150 MG 24 hr tablet Take 1 tablet (150 mg total) by mouth daily. 02/04/21   Lorre Munroe, NP  escitalopram (LEXAPRO) 20 MG tablet Take 1 tablet (20 mg total) by mouth daily. 12/18/20 02/16/21  Lorre Munroe, NP  Norethindrone-Ethinyl Estradiol-Fe Biphas (LO LOESTRIN FE) 1 MG-10 MCG / 10 MCG tablet TAKE 1 TABLET BY MOUTH DAILY. 01/23/21 01/23/22  Lorre Munroe, NP    Allergies  Allergen Reactions  . Amoxicillin Rash    REACTION: rash  . Penicillins Rash    Family History  Problem Relation Age of Onset  .  Hypertension Mother   . Thyroid disease Mother   . Hypertension Maternal Aunt   . Hypertension Maternal Uncle   . Alcohol abuse Maternal Uncle   . Heart disease Maternal Grandmother   . Cancer Maternal Grandmother        Breast  . Hypertension Father   . Cardiomyopathy Father   . Alcohol abuse Maternal Grandfather   . Alcohol abuse Paternal Uncle     Social History Social History   Tobacco Use  . Smoking status: Never Smoker  . Smokeless tobacco: Never Used  Vaping Use  . Vaping Use: Never used  Substance Use Topics  . Alcohol use: Yes    Comment: bottle of wine/day   . Drug use: No    Review of Systems Constitutional: Negative for fever. Cardiovascular: Negative for chest pain. Respiratory: Negative for shortness of breath. Gastrointestinal: Mild upper abdominal pain which she states is chronic every time she drinks.  No vomiting. Genitourinary: Negative for urinary compaints Neurological: Negative for headache All other ROS negative  ____________________________________________   PHYSICAL EXAM:  VITAL SIGNS: ED Triage Vitals  Enc Vitals Group     BP 02/13/21 1608 (!) 144/104     Pulse Rate 02/13/21 1608 (!) 128     Resp 02/13/21 1608 18     Temp 02/13/21 1608 98.6 F (37 C)     Temp Source 02/13/21 1608 Oral     SpO2 02/13/21 1608  94 %     Weight 02/13/21 1601 150 lb (68 kg)     Height 02/13/21 1601 5\' 10"  (1.778 m)     Head Circumference --      Peak Flow --      Pain Score 02/13/21 1601 0     Pain Loc --      Pain Edu? --      Excl. in GC? --    Constitutional: He is awake and alert.  Somewhat slurred speech admits to alcohol intoxication. Eyes: Normal exam ENT      Head: Normocephalic and atraumatic.      Mouth/Throat: Mucous membranes are moist. Cardiovascular: Regular rhythm rate around 120 bpm.  No murmur. Respiratory: Normal respiratory effort without tachypnea nor retractions. Breath sounds are clear Gastrointestinal: Soft and nontender.  No distention.   Musculoskeletal: Nontender with normal range of motion in all extremities. Neurologic: Mildly slurred speech. No gross focal neurologic deficits Skin:  Skin is warm, dry and intact.  Psychiatric: Mood and affect are normal.  ____________________________________________    INITIAL IMPRESSION / ASSESSMENT AND PLAN / ED COURSE  Pertinent labs & imaging results that were available during my care of the patient were reviewed by me and considered in my medical decision making (see chart for details).   Patient presents to the emergency department for alcohol intoxication.  Patient admits to alcohol intoxication but denies SI or HI.  Denies any intent to hurt her self.  States she drinks because she is an alcoholic.  We will check labs and continue to closely monitor.  Awaiting IVC paperwork arrival to see if we need to get psychiatry involved.  Patient's alcohol level significantly elevated 395.  The remainder of the work-up is largely nonrevealing.  I spoke to the patient's mother who is concerned given the patient's significant alcohol intake and reckless behaviors per mom.  Also states the patient has admitted to her recently that she is hearing voices telling her she needs to drink alcohol and also telling her to hurt herself by cutting her wrists.  Given these findings per mom we will place the patient under an IVC until psychiatry can evaluate.  We will also order CIWA for the patient as well  Sherri Harris was evaluated in Emergency Department on 02/13/2021 for the symptoms described in the history of present illness. She was evaluated in the context of the global COVID-19 pandemic, which necessitated consideration that the patient might be at risk for infection with the SARS-CoV-2 virus that causes COVID-19. Institutional protocols and algorithms that pertain to the evaluation of patients at risk for COVID-19 are in a state of rapid change based on information released by  regulatory bodies including the CDC and federal and state organizations. These policies and algorithms were followed during the patient's care in the ED.  The patient has been placed in psychiatric observation due to the need to provide a safe environment for the patient while obtaining psychiatric consultation and evaluation, as well as ongoing medical and medication management to treat the patient's condition.  The patient has been placed under full IVC at this time.  ____________________________________________   FINAL CLINICAL IMPRESSION(S) / ED DIAGNOSES  Alcohol intoxication Alcohol use disorder   04/15/2021, MD 02/13/21 [phone]

## 2021-02-13 NOTE — ED Notes (Signed)
Report received from Jessica, RN including SBAR. Patient alert and oriented, warm and dry, and in no acute distress. Patient denies SI, HI, AVH and pain. Patient made aware of Q15 minute rounds and Rover and Officer presence for their safety. Patient instructed to come to this nurse with needs or concerns.  ?

## 2021-02-13 NOTE — ED Triage Notes (Signed)
Pt comes voluntarily with [place] [employer]  For "drinking too much". Pt states mom was concerned that she was trying to hurt herself but pt denies that and says "she's an alcoholic". Pt has had a pint of liquor today. Pt calm, cooperative.   Per PD, mom is taking out IVC paperwork now. Denies SI/HI/Avh at this time.

## 2021-02-13 NOTE — ED Notes (Signed)
Hourly rounding performed, patient currently awake in restroom. Patient has no complaints at this time. Q15 minute rounds and monitoring via Rover and Officer to continue. 

## 2021-02-13 NOTE — BH Assessment (Signed)
Comprehensive Clinical Assessment (CCA) Note  02/13/2021 Sherri SheererHeather E Harris [id number]  Chief Complaint: Patient is a 25 year old female presenting [facility] initially voluntary but has since been IVC'd by attending ER doctor.Per triage note Pt comes voluntarily with [place] [employer]  For "drinking too much". Pt states mom was concerned that she was trying to hurt herself but pt denies that and says "she's an alcoholic". Pt has had a pint of liquor today. Pt calm, cooperative. Per PD, mom is taking out IVC paperwork now. Denies SI/HI/Avh at this time. During assessment patient appears alert and oriented x4, calm and cooperative, mood is anxious and irritable. Patient reports "I've been an alcoholic for a long time, my mom told all these people here that I wanted to hurt myself." Patient denies that she has any desire to want to hurt herself. Patient reports that she has been in rehab in the past and relapsed recently when asked if anything triggered her to relapse she reports "I just wanted to drink." Patient reports that she is currently in school for nursing at [employer] and has plans to finish her classes to go into nursing school. Patient reports that she wants to stop drinking but has no desire to return to detox or rehab at this time "what does detox do, I've been through that before." Patient BAL is 395. Patient denies SI/HI/AH/VH and does not appear to be responding to any internal or external stimuli  Disposition pending Chief Complaint  Patient presents with  . Psychiatric Evaluation  . Alcohol Problem   Visit Diagnosis: Alcohol Use Disorder    CCA Screening, Triage and Referral (STR)  Patient Reported Information How did you hear about us? Legal System  Referral name: No data recorded Referral phone number: No data recorded  Whom do you see for routine medical problems? Other (Comment)  Practice/Facility Name: No data recorded Practice/Facility Phone Number: No data  recorded Name of Contact: No data recorded Contact Number: No data recorded Contact Fax Number: No data recorded Prescriber Name: No data recorded Prescriber Address (if known): No data recorded  What Is the Reason for Your Visit/Call Today? Depression, alcohol abuse, self harm  How Long Has This Been Causing You Problems? > than 6 months  What Do You Feel Would Help You the Most Today? Therapy; Medication   Have You Recently Been in Any Inpatient Treatment (Hospital/Detox/Crisis Center/28-Day Program)? No  Name/Location of Program/Hospital:No data recorded How Long Were You There? No data recorded When Were You Discharged? No data recorded  Have You Ever Received Services From [facility] Before? No  Who Do You See at [facility]? ED, [facility]   Have You Recently Had Any Thoughts About Hurting Yourself? No  Are You Planning to Commit Suicide/Harm Yourself At This time? No   Have you Recently Had Thoughts About Hurting Someone Karolee Ohslse? No  Explanation: No data recorded  Have You Used Any Alcohol or Drugs in the Past 24 Hours? Yes  How Long Ago Did You Use Drugs or Alcohol? No data recorded What Did You Use and How Much? Alcohol "1 pint"   Do You Currently Have a Therapist/Psychiatrist? No  Name of Therapist/Psychiatrist: No data recorded  Have You Been Recently Discharged From Any Office Practice or Programs? No  Explanation of Discharge From Practice/Program: No data recorded    CCA Screening Triage Referral Assessment Type of Contact: Face-to-Face  Is this Initial or Reassessment? No data recorded Date Telepsych consult ordered  in [facility]:  No data recorded Time Telepsych consult ordered in [facility]:  No data recorded  Patient Reported Information Reviewed? Yes  Patient Left Without Being Seen? No data recorded Reason for Not Completing Assessment: No data recorded  Collateral Involvement: mother- [name]   Does Patient Have a Automotive engineer Guardian?  No data recorded Name and Contact of Legal Guardian: No data recorded If Minor and Not Living with Parent(s), Who has Custody? No data recorded Is CPS involved or ever been involved? Never  Is APS involved or ever been involved? Never   Patient Determined To Be At Risk for Harm To Self or Others Based on Review of Patient Reported Information or Presenting Complaint? No  Method: No data recorded Availability of Means: No data recorded Intent: No data recorded Notification Required: No data recorded Additional Information for Danger to Others Potential: No data recorded Additional Comments for Danger to Others Potential: No data recorded Are There Guns or Other Weapons in Your Home? No data recorded Types of Guns/Weapons: No data recorded Are These Weapons Safely Secured?                            No data recorded Who Could Verify You Are Able To Have These Secured: No data recorded Do You Have any Outstanding Charges, Pending Court Dates, Parole/Probation? No data recorded Contacted To Inform of Risk of Harm To Self or Others: No data recorded  [facility] of Assessment[facility] ED   Does Patient Present under Involuntary Commitment? Yes  IVC Papers Initial File Date: 02/13/2021   [place] of Residence: [place]   Patient Currently Receiving the Following Services: Not Receiving Services   Determination of Need: Emergent (2 hours)   Options For Referral: Medication Management; Chemical Dependency Intensive Outpatient Therapy (CDIOP); Outpatient Therapy     CCA Biopsychosocial Intake/Chief Complaint:  Patient is presenting under IVC due to alcohol abuse and reports per mom that patient wanted to hurt herself  Current Symptoms/Problems: Patient is presenting under IVC due to alcohol abuse and reports per mom that patient wanted to hurt herself   Patient Reported Schizophrenia/Schizoaffective Diagnosis in Past: No   Strengths: Patient is able to communicate  Preferences:  Unknown  Abilities: Patient is able to communicate   Type of Services Patient Feels are Needed: None   Initial Clinical Notes/Concerns: None   Mental Health Symptoms Depression:  None   Duration of Depressive symptoms: No data recorded  Mania:  None   Anxiety:   None   Psychosis:  None   Duration of Psychotic symptoms: No data recorded  Trauma:  None   Obsessions:  None   Compulsions:  None   Inattention:  None   Hyperactivity/Impulsivity:  N/A   Oppositional/Defiant Behaviors:  None   Emotional Irregularity:  None   Other Mood/Personality Symptoms:  No data recorded   Mental Status Exam Appearance and self-care  Stature:  Tall   Weight:  Average weight   Clothing:  Casual   Grooming:  Normal   Cosmetic use:  None   Posture/gait:  Normal   Motor activity:  Agitated   Sensorium  Attention:  Normal   Concentration:  Normal   Orientation:  X5   Recall/memory:  Normal   Affect and Mood  Affect:  Appropriate   Mood:  Anxious; Irritable   Relating  Eye contact:  Normal   Facial expression:  Anxious   Attitude toward examiner:  Cooperative  Thought and Language  Speech flow: Clear and Coherent   Thought content:  Appropriate to Mood and Circumstances   Preoccupation:  None   Hallucinations:  None   Organization:  No data recorded  [employer] of Knowledge:  Fair   Intelligence:  Average   Abstraction:  Normal   Judgement:  Fair   Dance movement psychotherapist:  Adequate   Insight:  Fair   Decision Making:  Normal   Social Functioning  Social Maturity:  Responsible   Social Judgement:  Normal   Stress  Stressors:  Family conflict   Coping Ability:  Normal   Skill Deficits:  None   Supports:  Family; Friends/Service system     Religion: Religion/Spirituality Are You A Religious Person?: No  Leisure/Recreation: Leisure / Recreation Do You Have Hobbies?: No  Exercise/Diet: Exercise/Diet Do You  Exercise?: No Have You Gained or Lost A Significant Amount of Weight in the Past Six Months?: No Do You Follow a Special Diet?: No Do You Have Any Trouble Sleeping?: No   CCA Employment/Education Employment/Work Situation: Employment / Work Psychologist, occupational Employment situation: Surveyor, minerals job has been impacted by current illness: No  Education: Education Is Patient Currently Attending School?: Yes School Currently Attending: [employer] Did Garment/textile technologist From [employer]?: Yes Did Theme park manager?: Yes What Type of College Degree Do you Have?: Currently in [employer] Did You Have An Individualized Education Program (IIEP): No Did You Have Any Difficulty At School?: No Patient's Education Has Been Impacted by Current Illness: No   CCA Family/Childhood History Family and Relationship History: Family history Marital status: Single Are you sexually active?:  (Unknown) What is your sexual orientation?: Heterosexual Has your sexual activity been affected by drugs, alcohol, medication, or emotional stress?: Unknown Does patient have children?: No  Childhood History:  Childhood History By whom was/is the patient raised?: Mother Additional childhood history information: None reported Description of patient's relationship with caregiver when they were a child: None reported Patient's description of current relationship with people who raised him/her: None reported How were you disciplined when you got in trouble as a child/adolescent?: None reported Does patient have siblings?:  (Unknown) Did patient suffer any verbal/emotional/physical/sexual abuse as a child?: No Did patient suffer from severe childhood neglect?: No Has patient ever been sexually abused/assaulted/raped as an adolescent or adult?: No Was the patient ever a victim of a crime or a disaster?: No Witnessed domestic violence?: No Has patient been affected by domestic violence as an adult?:  No  Child/Adolescent Assessment:     CCA Substance Use Alcohol/Drug Use: Alcohol / Drug Use Pain Medications: See MAR Prescriptions: See MAR Over the Counter: See MAR History of alcohol / drug use?: Yes Substance #1 Name of Substance 1: Alcohol 1 - Amount (size/oz): "1 pint" 1 - Frequency: "I just drank today" Hx of heavy use                       ASAM's:  Six Dimensions of Multidimensional Assessment  Dimension 1:  Acute Intoxication and/or Withdrawal Potential:      Dimension 2:  Biomedical Conditions and Complications:      Dimension 3:  Emotional, Behavioral, or Cognitive Conditions and Complications:     Dimension 4:  Readiness to Change:     Dimension 5:  Relapse, Continued use, or Continued Problem Potential:     Dimension 6:  Recovery/Living Environment:     ASAM Severity Score:  ASAM Recommended Level of Treatment:     Substance use Disorder (SUD) Substance Use Disorder (SUD)  Checklist Symptoms of Substance Use: Persistent desire or unsuccessful efforts to cut down or control use,Presence of craving or strong urge to use,Continued use despite persistent or recurrent social, interpersonal problems, caused or exacerbated by use,Large amounts of time spent to obtain, use or recover from the substance(s)  Recommendations for Services/Supports/Treatments:  Disposition pending  DSM5 Diagnoses: Patient Active Problem List   Diagnosis Date Noted  . 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 Anxiety and depression 10/14/2018    Patient Centered Plan: Patient is on the following Treatment Plan(s):  Substance Abuse   Referrals to Alternative Service(s): Referred to Alternative Service(s):   Place:   Date:   Time:    Referred to Alternative Service(s):   Place:   Date:   Time:    Referred to Alternative Service(s):   Place:   Date:   Time:    Referred to Alternative Service(s):   Place:   Date:   Time:     Annaya Bangert A Damon Hargrove,  LCAS-A

## 2021-02-14 DIAGNOSIS — F1094 Alcohol use, unspecified with alcohol-induced mood disorder: Secondary | ICD-10-CM | POA: Diagnosis not present

## 2021-02-14 DIAGNOSIS — F10129 Alcohol abuse with intoxication, unspecified: Secondary | ICD-10-CM | POA: Diagnosis not present

## 2021-02-14 MED ORDER — CHLORDIAZEPOXIDE HCL 25 MG PO CAPS
25.0000 mg | ORAL_CAPSULE | Freq: Once | ORAL | Status: AC
  Administered 2021-02-14: 25 mg via ORAL
  Filled 2021-02-14: qty 1

## 2021-02-14 NOTE — ED Notes (Signed)
Hourly rounding performed, patient currently asleep in room. Patient has no complaints at this time. Q15 minute rounds and monitoring via Rover and Officer to continue. 

## 2021-02-14 NOTE — ED Provider Notes (Signed)
Cleared by psych dc home.     Concha Se, MD 02/14/21 [phone]

## 2021-02-14 NOTE — ED Notes (Signed)
Hourly rounding performed, patient currently awake in room. Patient has no complaints at this time. Q15 minute rounds and monitoring via Rover and Officer to continue. 

## 2021-02-14 NOTE — Discharge Instructions (Addendum)
Cut down alcohol use

## 2021-02-14 NOTE — Consult Note (Signed)
[facility] Face-to-Face Psychiatry Consult   Reason for Consult: Consult for 25 year old woman brought to the hospital by family and with papers filed for commitment because of alcohol abuse Referring Physician: Fuller Plan Patient Identification: STEFAN KAREN MRN:  [id number] Principal Diagnosis: Alcohol use with alcohol-induced mood disorder (HCC) Diagnosis:  Principal Problem:   Alcohol use with alcohol-induced mood disorder (HCC)   Total Time spent with patient: 1 hour  Subjective:   Sherri Harris is a 25 y.o. female patient admitted with "I am drinking".  HPI: Patient seen chart reviewed.  25 year old woman was brought to the emergency room last night by her mother.  Blood alcohol level was very high almost 400 at the time.  Patient's mother threatened to file commitment papers.  I am not certain whether she did or whether the ER physician did but this was allegedly based on the mother's claim that the patient had made suicidal threats.  Patient appears to have denied that throughout her time in the emergency room.  On interview today the patient does not appear to be obviously intoxicated and is cooperative with the interview.  She says that currently she is drinking either a full bottle of wine or a pint of liquor every day.  She has been doing that for about a week.  Patient says before that she had been laying off the alcohol for about 3 weeks.  She reports this is a typical pattern for her that she will go on binges that can last for a week to a few weeks and then will stay off of alcohol for similar amounts of time.  Patient is very clear that she sees her alcohol use as a problem and refers to herself as "an alcoholic".  She denies that she is using any other drugs of abuse.  She says she is currently staying with her mother here locally although her plan is to move to [place] soon to get back into school to start studying nursing.  Patient feels that living with her mother is stressful for  her.  Patient absolutely denies any suicidal thought at all.  Denies homicidal ideation.  Denies hallucinations or psychotic symptoms.  Says that her mood is not great but denies being severely depressed or hopeless.  She is not currently seeing anyone for therapy although she is prescribed Lexapro and bupropion by her primary care doctor.  Past Psychiatric History: Patient reports that she has had problems with drinking since she was a teenager.  She has been to inpatient rehab facilities at least twice for up to a couple months at a time.  She has struggled to maintain sobriety outside of a setting like that because she typically does not participate willingly in AA meetings or go to see substance abuse counselors.  Patient denies any history of seizures or delirium tremens.  Denies any past suicide attempts.  Denies any history of psychosis.  She states that she has been prescribed either naltrexone or acamprosate or possibly both in the past but that she did not stay on them long enough to know if they would make any difference.  Risk to Self:   Risk to Others:   Prior Inpatient Therapy:   Prior Outpatient Therapy:    Past Medical History:  Past Medical History:  Diagnosis Date  . Anxiety   . Hypertension     Past Surgical History:  Procedure Laterality Date  . TYMPANOSTOMY TUBE PLACEMENT    . WISDOM TOOTH EXTRACTION  Family History:  Family History  Problem Relation Age of Onset  . Hypertension Mother   . Thyroid disease Mother   . Hypertension Maternal Aunt   . Hypertension Maternal Uncle   . Alcohol abuse Maternal Uncle   . Heart disease Maternal Grandmother   . Cancer Maternal Grandmother        Breast  . Hypertension Father   . Cardiomyopathy Father   . Alcohol abuse Maternal Grandfather   . Alcohol abuse Paternal Uncle    Family Psychiatric  History: Patient reports several members of her family with alcohol abuse problems on both sides of the family Social History:   Social History   Substance and Sexual Activity  Alcohol Use Yes   Comment: bottle of wine/day      Social History   Substance and Sexual Activity  Drug Use No    Social History   Socioeconomic History  . Marital status: Single    Spouse name: Not on file  . Number of children: Not on file  . Years of education: Not on file  . Highest education level: Not on file  Occupational History  . Not on file  Tobacco Use  . Smoking status: Never Smoker  . Smokeless tobacco: Never Used  Vaping Use  . Vaping Use: Never used  Substance and Sexual Activity  . Alcohol use: Yes    Comment: bottle of wine/day   . Drug use: No  . Sexual activity: Yes    Birth control/protection: Pill  Other Topics Concern  . Not on file  Social History Narrative  . Not on file   Social Determinants of Health   Financial Resource Strain: Not on file  Food Insecurity: Not on file  Transportation Needs: Not on file  Physical Activity: Not on file  Stress: Not on file  Social Connections: Not on file   Additional Social History:    Allergies:   Allergies  Allergen Reactions  . Amoxicillin Rash    REACTION: rash  . Penicillins Rash    Labs:  Results for orders placed or performed during the hospital encounter of 02/13/21 (from the past 48 hour(s))  Comprehensive metabolic panel     Status: Abnormal   Collection Time: 02/13/21  4:13 PM  Result Value Ref Range   Sodium 140 135 - 145 mmol/L   Potassium 3.8 3.5 - 5.1 mmol/L   Chloride 102 98 - 111 mmol/L   CO2 22 22 - 32 mmol/L   Glucose, Bld 104 (H) 70 - 99 mg/dL    Comment: Glucose reference range applies only to samples taken after fasting for at least 8 hours.   BUN 13 6 - 20 mg/dL   Creatinine, Ser 1.61 (H) 0.44 - 1.00 mg/dL   Calcium 8.9 8.9 - 09.6 mg/dL   Total Protein 8.0 6.5 - 8.1 g/dL   Albumin 4.7 3.5 - 5.0 g/dL   AST 31 15 - 41 U/L   ALT 26 0 - 44 U/L   Alkaline Phosphatase 61 38 - 126 U/L   Total Bilirubin 0.7 0.3 - 1.2  mg/dL   GFR, Estimated >04 >54 mL/min    Comment: (NOTE) Calculated using the CKD-EPI Creatinine Equation (2021)    Anion gap 16 (H) 5 - 15    Comment: Performed at [facility], [address], [place], [place] [address]  Ethanol     Status: Abnormal   Collection Time: 02/13/21  4:13 PM  Result Value Ref Range  Alcohol, Ethyl (B) 395 (HH) <10 mg/dL    Comment: CRITICAL RESULT CALLED TO, READ BACK BY AND VERIFIED WITH JESSICA COLTRANE AT 1700 ON 02/13/2021 [facility]. (NOTE) Lowest detectable limit for serum alcohol is 10 mg/dL.  For medical purposes only. Performed at [facility], [address] [place], [place] [address]   cbc     Status: Abnormal   Collection Time: 02/13/21  4:13 PM  Result Value Ref Range   WBC 8.7 4.0 - 10.5 K/uL   RBC 5.08 3.87 - 5.11 MIL/uL   Hemoglobin 16.0 (H) 12.0 - 15.0 g/dL   HCT 60.4 54.0 - 98.1 %   MCV 90.4 80.0 - 100.0 fL   MCH 31.5 26.0 - 34.0 pg   MCHC 34.9 30.0 - 36.0 g/dL   RDW 19.1 47.8 - 29.5 %   Platelets 497 (H) 150 - 400 K/uL   nRBC 0.0 0.0 - 0.2 %    Comment: Performed at [facility], [address], [place], [place] [address]  Urine Drug Screen, Qualitative     Status: None   Collection Time: 02/13/21  4:13 PM  Result Value Ref Range   Tricyclic, Ur Screen NONE DETECTED NONE DETECTED   Amphetamines, Ur Screen NONE DETECTED NONE DETECTED   MDMA (Ecstasy)Ur Screen NONE DETECTED NONE DETECTED   Cocaine Metabolite,Ur Niland NONE DETECTED NONE DETECTED   Opiate, Ur Screen NONE DETECTED NONE DETECTED   Phencyclidine (PCP) Ur S NONE DETECTED NONE DETECTED   Cannabinoid 50 Ng, Ur Willits NONE DETECTED NONE DETECTED   Barbiturates, Ur Screen NONE DETECTED NONE DETECTED   Benzodiazepine, Ur Scrn NONE DETECTED NONE DETECTED   Methadone Scn, Ur NONE DETECTED NONE DETECTED    Comment: (NOTE) Tricyclics + metabolites, urine    Cutoff 1000 ng/mL Amphetamines + metabolites, urine  Cutoff 1000 ng/mL MDMA (Ecstasy), urine               Cutoff 500 ng/mL Cocaine Metabolite, urine          Cutoff 300 ng/mL Opiate + metabolites, urine        Cutoff 300 ng/mL Phencyclidine (PCP), urine         Cutoff 25 ng/mL Cannabinoid, urine                 Cutoff 50 ng/mL Barbiturates + metabolites, urine  Cutoff 200 ng/mL Benzodiazepine, urine              Cutoff 200 ng/mL Methadone, urine                   Cutoff 300 ng/mL  The urine drug screen provides only a preliminary, unconfirmed analytical test result and should not be used for non-medical purposes. Clinical consideration and professional judgment should be applied to any positive drug screen result due to possible interfering substances. A more specific alternate chemical method must be used in order to obtain a confirmed analytical result. Gas chromatography / mass spectrometry (GC/MS) is the preferred confirm atory method. Performed at [facility], [address] [place], [place] [address]   POC urine preg, ED     Status: None   Collection Time: 02/13/21  4:16 PM  Result Value Ref Range   Preg Test, Ur NEGATIVE NEGATIVE    Comment:        THE SENSITIVITY OF THIS METHODOLOGY IS >24 mIU/mL   Lipase, blood     Status: None   Collection Time: 02/13/21  4:19 PM  Result  Value Ref Range   Lipase 36 11 - 51 U/L    Comment: Performed at [facility], [address] [place], [place] [address]  Resp Panel by RT-PCR (Flu A&B, Covid) Nasopharyngeal Swab     Status: None   Collection Time: 02/13/21  8:07 PM   Specimen: Nasopharyngeal Swab; Nasopharyngeal(NP) swabs in vial transport medium  Result Value Ref Range   SARS Coronavirus 2 by RT PCR NEGATIVE NEGATIVE    Comment: (NOTE) SARS-CoV-2 target nucleic acids are NOT DETECTED.  The SARS-CoV-2 RNA is generally detectable in upper respiratory specimens during the acute phase of infection. The lowest concentration of SARS-CoV-2 viral copies this assay can detect is 138 copies/mL. A negative  result does not preclude SARS-Cov-2 infection and should not be used as the sole basis for treatment or other patient management decisions. A negative result may occur with  improper specimen collection/handling, submission of specimen other than nasopharyngeal swab, presence of viral mutation(s) within the areas targeted by this assay, and inadequate number of viral copies(<138 copies/mL). A negative result must be combined with clinical observations, patient history, and epidemiological information. The expected result is Negative.  Fact Sheet for Patients:  BloggerCourse.com  Fact Sheet for Healthcare Providers:  SeriousBroker.it  This test is no t yet approved or cleared by the [place] FDA and  has been authorized for detection and/or diagnosis of SARS-CoV-2 by FDA under an Emergency Use Authorization (EUA). This EUA will remain  in effect (meaning this test can be used) for the duration of the COVID-19 declaration under Section 564(b)(1) of the Act, 21 U.S.C.section 360bbb-3(b)(1), unless the authorization is terminated  or revoked sooner.       Influenza A by PCR NEGATIVE NEGATIVE   Influenza B by PCR NEGATIVE NEGATIVE    Comment: (NOTE) The Xpert Xpress SARS-CoV-2/FLU/RSV plus assay is intended as an aid in the diagnosis of influenza from Nasopharyngeal swab specimens and should not be used as a sole basis for treatment. Nasal washings and aspirates are unacceptable for Xpert Xpress SARS-CoV-2/FLU/RSV testing.  Fact Sheet for Patients: BloggerCourse.com  Fact Sheet for Healthcare Providers: SeriousBroker.it  This test is not yet approved or cleared by the [place] FDA and has been authorized for detection and/or diagnosis of SARS-CoV-2 by FDA under an Emergency Use Authorization (EUA). This EUA will remain in effect (meaning this test can be used) for the  duration of the COVID-19 declaration under Section 564(b)(1) of the Act, 21 U.S.C. section 360bbb-3(b)(1), unless the authorization is terminated or revoked.  Performed at [facility], [address] [place], [place] [address]     Current Facility-Administered Medications  Medication Dose Route Frequency Provider Last Rate Last Admin  . LORazepam (ATIVAN) injection 0-4 mg  0-4 mg Intravenous Q6H Minna Antis, MD       Or  . LORazepam (ATIVAN) tablet 0-4 mg  0-4 mg Oral Q6H Minna Antis, MD   2 mg at 02/14/21 0925  . [START ON 02/16/2021] LORazepam (ATIVAN) injection 0-4 mg  0-4 mg Intravenous Q12H Minna Antis, MD       Or  . Melene Muller ON 02/16/2021] LORazepam (ATIVAN) tablet 0-4 mg  0-4 mg Oral Q12H Paduchowski, Caryn Bee, MD      . nicotine (NICODERM CQ - dosed in mg/24 hours) patch 21 mg  21 mg Transdermal Once Minna Antis, MD   21 mg at 02/13/21 2031  . thiamine tablet 100 mg  100 mg Oral Daily Minna Antis, MD  100 mg at 02/14/21 6503   Or  . thiamine (B-1) injection 100 mg  100 mg Intravenous Daily Minna Antis, MD       Current Outpatient Medications  Medication Sig Dispense Refill  . buPROPion (WELLBUTRIN XL) 150 MG 24 hr tablet Take 1 tablet (150 mg total) by mouth daily. 30 tablet 1  . escitalopram (LEXAPRO) 20 MG tablet Take 1 tablet (20 mg total) by mouth daily. 90 tablet 1  . Norethindrone-Ethinyl Estradiol-Fe Biphas (LO LOESTRIN FE) 1 MG-10 MCG / 10 MCG tablet TAKE 1 TABLET BY MOUTH DAILY. 84 tablet 2    Musculoskeletal: Strength & Muscle Tone: within normal limits Gait & Station: normal Patient leans: N/A            Psychiatric Specialty Exam:  Presentation  General Appearance: Appropriate for Environment; Casual  Eye Contact:Good  Speech:Clear and Coherent; Normal Rate  Speech Volume:Normal  Handedness:Right   Mood and Affect  Mood:Euthymic  Affect:Congruent; Appropriate   Thought Process  Thought  Processes:Coherent  Descriptions of Associations:Intact  Orientation:Full (Time, Place and Person)  Thought Content:WDL  History of Schizophrenia/Schizoaffective disorder:No  Duration of Psychotic Symptoms:No data recorded Hallucinations:No data recorded Ideas of Reference:None  Suicidal Thoughts:No data recorded Homicidal Thoughts:No data recorded  Sensorium  Memory:Immediate Good; Recent Good; Remote Good  Judgment:Good  Insight:Good   Executive Functions  Concentration:Good  Attention Span:Good  Recall:Good  Fund of Knowledge:Good  Language:Good   Psychomotor Activity  Psychomotor Activity:No data recorded  Assets  Assets:Communication Skills; Desire for Improvement; Financial Resources/Insurance; Housing; Social Support; Physical Health; Transportation   Sleep  Sleep:No data recorded  Physical Exam: Physical Exam Vitals and nursing note reviewed.  Constitutional:      Appearance: Normal appearance.  HENT:     Head: Normocephalic and atraumatic.     Mouth/Throat:     Pharynx: Oropharynx is clear.  Eyes:     Pupils: Pupils are equal, round, and reactive to light.  Cardiovascular:     Rate and Rhythm: Normal rate and regular rhythm.  Pulmonary:     Effort: Pulmonary effort is normal.     Breath sounds: Normal breath sounds.  Abdominal:     General: Abdomen is flat.     Palpations: Abdomen is soft.  Musculoskeletal:        General: Normal range of motion.  Skin:    General: Skin is warm and dry.  Neurological:     General: No focal deficit present.     Mental Status: She is alert. Mental status is at baseline.  Psychiatric:        Attention and Perception: Attention normal.        Mood and Affect: Mood normal. Affect is blunt.        Speech: Speech is delayed.        Behavior: Behavior is slowed.        Thought Content: Thought content normal. Thought content is not paranoid or delusional. Thought content does not include homicidal or  suicidal ideation.        Cognition and Memory: Memory is impaired.    Review of Systems  Constitutional: Negative.   HENT: Negative.   Eyes: Negative.   Respiratory: Negative.   Cardiovascular: Negative.   Gastrointestinal: Negative.   Musculoskeletal: Negative.   Skin: Negative.   Neurological: Negative.   Psychiatric/Behavioral: Positive for memory loss and substance abuse. Negative for depression, hallucinations and suicidal ideas. The patient is not nervous/anxious and does not have insomnia.  Blood pressure (!) 137/102, pulse (!) 111, temperature 97.6 F (36.4 C), temperature source Oral, resp. rate 16, height 5\' 10"  (1.778 m), weight 68 kg, SpO2 96 %. Body mass index is 21.52 kg/m.  Treatment Plan Summary: Plan 25 year old woman with alcohol abuse with a binge pattern.  Intoxicated last night but currently clinically sobered up.  No sign of delirium.  No past history reported of seizures or delirium that would require inpatient detox.  Patient is completely denying suicidal ideation.  She does not meet commitment criteria.  Spent some time with her talking about alcohol abuse treatment acknowledging that she has a lot of her own experience with this.  Tried to do a little bit of motivational interviewing.  The patient claims that she wants to make efforts to change and that she feels that moving out of her mother's house will help with that.  Challenged her to think about what she needs to do to really make a change with drinking.  Also mention to her that she should make sure she is taking multivitamins with thiamine regularly.  At this point the patient does not meet commitment criteria and does not require inpatient treatment.  Case reviewed with emergency room physician.  Discontinued IVC.  Disposition: No evidence of imminent risk to self or others at present.   Patient does not meet criteria for psychiatric inpatient admission. Supportive therapy provided about ongoing  stressors.  Mordecai RasmussenJohn Rosie Torrez, MD 02/14/2021 1:50 PM

## 2021-02-14 NOTE — ED Notes (Signed)
VOLUNTARY as IVC overturned by Dr Toni Amend to d/c

## 2021-02-14 NOTE — ED Notes (Signed)
Pt asleep at this time, unable to collect vitals. Will collect pt vitals once awake. 

## 2021-02-14 NOTE — ED Notes (Signed)
Pt requesting ativan due to feelings of alcohol withdrawal increasing. Pt states she has tremors, nausea, headache, light sensitivity. Dr. Katrinka Blazing notified.

## 2021-02-14 NOTE — ED Notes (Signed)
Pt given a cup of ice water

## 2021-02-14 NOTE — ED Provider Notes (Addendum)
Emergency Medicine Observation Re-evaluation Note  Sherri Harris is a 25 y.o. female, seen on rounds today.  Pt initially presented to the ED for complaints of Psychiatric Evaluation and Alcohol Problem Currently, the patient is sleeping comfortably.  Physical Exam  BP (!) 137/102 (BP Location: Right Arm)   Pulse (!) 112   Temp 97.6 F (36.4 C) (Oral)   Resp 16   Ht 5\' 10"  (1.778 m)   Wt 68 kg   SpO2 96%   BMI 21.52 kg/m  Physical Exam Constitutional:      Appearance: She is not ill-appearing or toxic-appearing.  HENT:     Head: Atraumatic.  Cardiovascular:     Comments: Well perfused Pulmonary:     Effort: Pulmonary effort is normal.  Abdominal:     General: There is no distension.  Musculoskeletal:        General: No deformity.  Skin:    Findings: No rash.  Neurological:     General: No focal deficit present.     Cranial Nerves: No cranial nerve deficit.      ED Course / MDM  EKG:   I have reviewed the labs performed to date as well as medications administered while in observation.  Recent changes in the last 24 hours include none, awaiting psychiatric assessment. Pt remains on CIWA protocol with minimal usage. Provided one tablet of Librium.  Plan  Current plan is for psych assessment for possible rehab placement. Patient is under full IVC at this time.   , MD 02/14/21 04/16/21    [id number], MD 02/14/21 [phone]

## 2021-03-13 DIAGNOSIS — F1020 Alcohol dependence, uncomplicated: Secondary | ICD-10-CM | POA: Diagnosis not present

## 2021-04-01 ENCOUNTER — Encounter: Payer: Self-pay | Admitting: Internal Medicine

## 2021-04-08 MED ORDER — ESCITALOPRAM OXALATE 20 MG PO TABS: 20.0000 mg | ORAL_TABLET | Freq: Every day | ORAL | 1 refills | Status: DC

## 2021-04-08 MED ORDER — NORETHIN-ETH ESTRAD-FE BIPHAS 1 MG-10 MCG / 10 MCG PO TABS: 1.0000 | ORAL_TABLET | Freq: Every day | ORAL | 2 refills | Status: DC

## 2021-04-09 DIAGNOSIS — E876 Hypokalemia: Secondary | ICD-10-CM | POA: Diagnosis not present

## 2021-04-09 DIAGNOSIS — Y908 Blood alcohol level of 240 mg/100 ml or more: Secondary | ICD-10-CM | POA: Diagnosis not present

## 2021-04-09 DIAGNOSIS — R748 Abnormal levels of other serum enzymes: Secondary | ICD-10-CM | POA: Diagnosis not present

## 2021-04-09 DIAGNOSIS — F17210 Nicotine dependence, cigarettes, uncomplicated: Secondary | ICD-10-CM | POA: Diagnosis not present

## 2021-04-09 DIAGNOSIS — F10129 Alcohol abuse with intoxication, unspecified: Secondary | ICD-10-CM | POA: Diagnosis not present

## 2021-04-09 DIAGNOSIS — K7010 Alcoholic hepatitis without ascites: Secondary | ICD-10-CM | POA: Diagnosis not present

## 2021-04-09 DIAGNOSIS — K7689 Other specified diseases of liver: Secondary | ICD-10-CM | POA: Diagnosis not present

## 2021-04-09 DIAGNOSIS — Z20822 Contact with and (suspected) exposure to covid-19: Secondary | ICD-10-CM | POA: Diagnosis not present

## 2021-04-09 DIAGNOSIS — R945 Abnormal results of liver function studies: Secondary | ICD-10-CM | POA: Diagnosis not present

## 2021-04-09 DIAGNOSIS — F1010 Alcohol abuse, uncomplicated: Secondary | ICD-10-CM | POA: Diagnosis not present

## 2021-04-09 DIAGNOSIS — K760 Fatty (change of) liver, not elsewhere classified: Secondary | ICD-10-CM | POA: Diagnosis not present

## 2021-04-09 DIAGNOSIS — S0990XA Unspecified injury of head, initial encounter: Secondary | ICD-10-CM | POA: Diagnosis not present

## 2021-04-09 DIAGNOSIS — R42 Dizziness and giddiness: Secondary | ICD-10-CM | POA: Diagnosis not present

## 2021-06-09 DIAGNOSIS — F419 Anxiety disorder, unspecified: Secondary | ICD-10-CM | POA: Diagnosis not present

## 2021-06-09 DIAGNOSIS — F1020 Alcohol dependence, uncomplicated: Secondary | ICD-10-CM | POA: Diagnosis not present

## 2021-06-09 DIAGNOSIS — F10230 Alcohol dependence with withdrawal, uncomplicated: Secondary | ICD-10-CM | POA: Diagnosis not present

## 2021-06-12 ENCOUNTER — Telehealth: Payer: Self-pay

## 2021-06-12 NOTE — Telephone Encounter (Signed)
Called patient but could not leave a message.  Called [name], patient's mom, was advised patient is currently in the hospital but that patient is planning on following Nicki Reaper to her office in May.

## 2021-06-18 ENCOUNTER — Encounter: Payer: Self-pay | Admitting: Internal Medicine

## 2021-06-19 MED ORDER — NALTREXONE 380 MG IM SUSR: 380.0000 mg | INTRAMUSCULAR | 2 refills | Status: DC

## 2021-06-19 MED ORDER — CITALOPRAM HYDROBROMIDE 40 MG PO TABS: 40.0000 mg | ORAL_TABLET | Freq: Every day | ORAL | 0 refills | Status: DC

## 2021-06-19 MED ORDER — NORETHIN-ETH ESTRAD-FE BIPHAS 1 MG-10 MCG / 10 MCG PO TABS: 1.0000 | ORAL_TABLET | Freq: Every day | ORAL | 2 refills | Status: DC

## 2021-06-19 NOTE — Addendum Note (Signed)
Addended by: Lorre Munroe on: 06/19/2021 04:53 PM   Modules accepted: Orders

## 2021-06-20 ENCOUNTER — Telehealth (INDEPENDENT_AMBULATORY_CARE_PROVIDER_SITE_OTHER): Payer: BC Managed Care – PPO | Admitting: Internal Medicine

## 2021-06-20 ENCOUNTER — Encounter: Payer: Self-pay | Admitting: Internal Medicine

## 2021-06-20 DIAGNOSIS — F32A Depression, unspecified: Secondary | ICD-10-CM | POA: Diagnosis not present

## 2021-06-20 DIAGNOSIS — F1094 Alcohol use, unspecified with alcohol-induced mood disorder: Secondary | ICD-10-CM | POA: Diagnosis not present

## 2021-06-20 DIAGNOSIS — F419 Anxiety disorder, unspecified: Secondary | ICD-10-CM | POA: Diagnosis not present

## 2021-06-20 MED ORDER — ONDANSETRON 4 MG PO TBDP: 4.0000 mg | ORAL_TABLET | Freq: Three times a day (TID) | ORAL | 0 refills | Status: DC | PRN

## 2021-06-20 NOTE — Progress Notes (Signed)
Virtual Visit via Video Note  I connected with Sherri Harris on 06/20/21 at 11:20 AM EDT by a video enabled telemedicine application and verified that I am speaking with the correct person using two identifiers.  Location: Patient: Home Provider: Office  Person's participating in this video call: Sherri Reaper, NP, [name] and Sherri Harris (patients mother)   I discussed the limitations of evaluation and management by telemedicine and the availability of in person appointments. The patient expressed understanding and agreed to proceed.  History of Present Illness:  Patient needs to follow up chronic conditions  Alcohol Use Disorder: Recent rehab stay, now currently living at a sober house in [place].  She had a recent relapse and is currently staying at her mother's home.  She is not feeling well at all today as she has tried to stop drinking again.  She is taking Naltrexone injections monthly and is due for her next dose.   Anxiety and Depression: Currently managed on Citalopram.  She is not currently seeing a therapist.  She denies SI/HI.    Past Medical History:  Diagnosis Date   Anxiety    Hypertension     Current Outpatient Medications  Medication Sig Dispense Refill   ondansetron (ZOFRAN ODT) 4 MG disintegrating tablet Take 1 tablet (4 mg total) by mouth every 8 (eight) hours as needed for nausea or vomiting. 30 tablet 0   citalopram (CELEXA) 40 MG tablet Take 1 tablet (40 mg total) by mouth daily. 90 tablet 0   Naltrexone 380 MG SUSR Inject 380 mg into the muscle every 30 (thirty) days. 1.2 each 2   Norethindrone-Ethinyl Estradiol-Fe Biphas (LO LOESTRIN FE) 1 MG-10 MCG / 10 MCG tablet TAKE 1 TABLET BY MOUTH DAILY. 84 tablet 2   No current facility-administered medications for this visit.    Allergies  Allergen Reactions   Amoxicillin Rash    REACTION: rash   Penicillins Rash    Family History  Problem Relation Age of Onset   Hypertension Mother    Thyroid  disease Mother    Hypertension Maternal Aunt    Hypertension Maternal Uncle    Alcohol abuse Maternal Uncle    Heart disease Maternal Grandmother    Cancer Maternal Grandmother        Breast   Hypertension Father    Cardiomyopathy Father    Alcohol abuse Maternal Grandfather    Alcohol abuse Paternal Uncle     Social History   Socioeconomic History   Marital status: Single    Spouse name: Not on file   Number of children: Not on file   Years of education: Not on file   Highest education level: Not on file  Occupational History   Not on file  Tobacco Use   Smoking status: Never   Smokeless tobacco: Never  Vaping Use   Vaping Use: Never used  Substance and Sexual Activity   Alcohol use: Yes    Comment: bottle of wine/day    Drug use: No   Sexual activity: Yes    Birth control/protection: Pill  Other Topics Concern   Not on file  Social History Narrative   Not on file   Social Determinants of Health   Financial Resource Strain: Not on file  Food Insecurity: Not on file  Transportation Needs: Not on file  Physical Activity: Not on file  Stress: Not on file  Social Connections: Not on file  Intimate Partner Violence: Not on file  Constitutional: Patient reports malaise.  Denies fever, fatigue, headache or abrupt weight changes.  Respiratory: Denies difficulty breathing, shortness of breath, cough or sputum production.   Cardiovascular: Denies chest pain, chest tightness, palpitations or swelling in the hands or feet.  Gastrointestinal: Patient reports nausea and vomiting.  Denies abdominal pain, bloating, constipation, diarrhea or blood in the stool.  GNeurological: Denies dizziness, difficulty with memory, difficulty with speech or problems with balance and coordination.  Psych: Patient has a history of anxiety and depression.  Denies SI/HI.  No other specific complaints in a complete review of systems (except as listed in HPI  above).  Observations/Objective:  General: Appears her stated age, laying in bed but in NAD. Pulmonary/Chest: Normal effort. No respiratory distress.  Neurological: Alert and oriented.   Assessment and Plan:   Follow Up Instructions:    I discussed the assessment and treatment plan with the patient. The patient was provided an opportunity to ask questions and all were answered. The patient agreed with the plan and demonstrated an understanding of the instructions.   The patient was advised to call back or seek an in-person evaluation if the symptoms worsen or if the condition fails to improve as anticipated.    Sherri Reaper, NP

## 2021-06-20 NOTE — Assessment & Plan Note (Signed)
Persistent RX for Naltrexone 380 mg montly Currently detoxing RX for Zofran 4mg  PO TID prn

## 2021-06-20 NOTE — Patient Instructions (Signed)
Alcohol Use Disorder Alcohol use disorder is a condition in which drinking disrupts daily life. People with this condition drink too much alcohol and cannot control their drinking. Alcohol use disorder can cause serious problems with physical health. It can affect the brain, heart, and other internal organs. This disorder can raise the risk for certain cancers and cause problems with mental health, such as depression or anxiety. What are the causes? This condition is caused by drinking too much alcohol over time. Some people with this condition drink to cope with or escape from negative life events. Others drink to relieve pain or symptoms of mental illness. What increases the risk? You are more likely to develop this condition if: You have a family history of alcohol use disorder. Your culture encourages drinking to the point of becoming drunk (intoxication). You had a mood or conduct disorder in childhood. You have been abused. You are an adolescent and you: Have poor performance in school. Have poor supervision or guidance. Act on impulse and like taking risks. What are the signs or symptoms? Symptoms of this condition include: Drinking more than you want to. Trying several times without success to drink less. Spending a lot of time thinking about alcohol, getting alcohol, drinking, or recovering from drinking. Continuing to drink even when it is causing serious problems in your daily life. Drinking when it is dangerous to drink, such as before driving a car. Needing more and more alcohol to get the same effect you want (building up tolerance). Having symptoms of withdrawal when you stop drinking. Withdrawal symptoms may include: Trouble sleeping, leading to tiredness (fatigue). Mood swings of depression and anxiety. Physical symptoms, such as a fast heart rate, rapid breathing, high blood pressure (hypertension), fever, cold sweats, or nausea. Seizures. Severe confusion. Feeling or  seeing things that are not there (hallucinations). Shaking movements that you cannot control (tremors). How is this diagnosed? This condition is diagnosed with an assessment. Your health care provider may start by asking three or four questions about your drinking, or he or she may give you a simple test to take. This helps to get clear information from you. You may also have a physical exam or lab tests. You may be referred to a substance abuse counselor. How is this treated? With education, some people with alcohol use disorder are able to reduce their drinking. Many with this disorder cannot change their drinking behavior on their own and need help from substance use specialists. These specialists are counselors who can help diagnose how severe your disorder is and what type of treatment you need. Treatments may include: Detoxification. Detoxification involves quitting drinking with supervision and direction of health care providers. Your health care provider may prescribe prescription medicines within the first week to help lessen withdrawal symptoms. Alcohol withdrawal can be dangerous and life-threatening. Detoxification may be provided in a home, community, or primary care setting, or in a hospital or substance use treatment facility. Counseling. This may involve motivational interviewing (MI), family therapy, or cognitive behavioral therapy (CBT). It is provided by substance use treatment counselors or professional therapists. A counselor can address the things you can do to change your drinking behavior and how to maintain the changes. Talk therapy aims to: Identify your positive motivations to change. Identify and avoid the things that trigger your drinking. Help you learn how to plan your behavior change. Develop support systems that can help you sustain the change. Medicines. Medicines can help treat this disorder by: Decreasing cravings. Decreasing the   positive feeling you have when you  drink. Causing an uncomfortable physical reaction when you drink (aversion therapy). Mutual help groups such as Alcoholics Anonymous (AA). These groups are led by people who have quit drinking. The groups provide emotional support, advice, and guidance. Some people with this condition benefit from a combination of treatments provided by specialized substance use treatment centers. Follow these instructions at home: Medicines Take over-the-counter and prescription medicines only as told by your health care provider. Ask before starting any new medicines, herbs, or supplements. General instructions Ask friends and family members to support your choice to stay sober. Avoid situations where alcohol is served. Create a plan to deal with tempting situations. Attend support groups regularly. Practice hobbies or activities you enjoy. Do not drink and drive. Keep all follow-up visits as told by your health care provider. This is important. How is this prevented? If you drink alcohol: Limit how much you use to: 0-1 drink a day for nonpregnant women. 0-2 drinks a day for men. Be aware of how much alcohol is in your drink. In the [place] one drink equals one 12 oz bottle of beer (355 mL), one 5 oz glass of wine (148 mL), or one 1 oz glass of hard liquor (44 mL). If you have a mental health condition, seek treatment. Develop a healthy lifestyle through: Meditation or deep breathing. Exercise. Spending time in nature. Listening to music. Talking with a trusted friend or family member. If you are an adolescent: Do not drink alcohol. Avoid gatherings where you might be tempted. Do not be afraid to say no if someone offers you alcohol. Speak up about why you do not want to drink. Set a positive example for others around you by not drinking. Build relationships with friends who do not drink. Where to find more information Substance Abuse and Mental Health Services Administration: RockToxic.pl Alcoholics  Anonymous: CustomizedRugs.fi Contact a health care provider if: You cannot take your medicines as told. Your symptoms get worse or you experience symptoms of withdrawal when you stop drinking. You start drinking again (relapse) and your symptoms get worse. Get help right away if: You have thoughts about hurting yourself or others. If you ever feel like you may hurt yourself or others, or have thoughts about taking your own life, get help right away. Go to your nearest emergency department or: Call your local emergency services (911 in the [place] Call a suicide crisis helpline, such as the National Suicide Prevention Lifeline at 2127841474. This is open 24 hours a day in the [place] Text the Crisis Text Line at 289-169-4632 (in the [place] Summary Alcohol use disorder is a condition in which drinking disrupts daily life. People with this condition drink too much alcohol and cannot control their drinking. Treatment may include detoxification, counseling, medicines, and support groups. Ask friends and family members to support you. Avoid situations where alcohol is served. Get help right away if you have thoughts about hurting yourself or others. This information is not intended to replace advice given to you by your health care provider. Make sure you discuss any questions you have with your health care provider. Document Revised: 08/18/2019 Document Reviewed: 08/18/2019 Elsevier Patient Education  2022 [employer].

## 2021-06-20 NOTE — Assessment & Plan Note (Signed)
Continue Citalopram Support offered Encouraged her to meet with a therapist in [place]

## 2021-07-08 ENCOUNTER — Telehealth: Payer: Self-pay | Admitting: Internal Medicine

## 2021-07-08 NOTE — Telephone Encounter (Signed)
Received Email that patient is disputing no-show fee from 01/04/21. Lvm asking pt to call office so we can discuss. If patient calls back, please let me know.

## 2021-07-18 DIAGNOSIS — F411 Generalized anxiety disorder: Secondary | ICD-10-CM | POA: Diagnosis not present

## 2021-07-18 DIAGNOSIS — F17200 Nicotine dependence, unspecified, uncomplicated: Secondary | ICD-10-CM | POA: Diagnosis not present

## 2021-07-18 DIAGNOSIS — F1020 Alcohol dependence, uncomplicated: Secondary | ICD-10-CM | POA: Diagnosis not present

## 2021-07-18 DIAGNOSIS — Z1331 Encounter for screening for depression: Secondary | ICD-10-CM | POA: Diagnosis not present

## 2021-08-07 DIAGNOSIS — K7010 Alcoholic hepatitis without ascites: Secondary | ICD-10-CM | POA: Diagnosis not present

## 2021-08-08 DIAGNOSIS — R769 Abnormal immunological finding in serum, unspecified: Secondary | ICD-10-CM | POA: Diagnosis not present

## 2021-08-08 DIAGNOSIS — S7002XA Contusion of left hip, initial encounter: Secondary | ICD-10-CM | POA: Diagnosis not present

## 2021-08-08 DIAGNOSIS — G9340 Encephalopathy, unspecified: Secondary | ICD-10-CM | POA: Diagnosis not present

## 2021-08-08 DIAGNOSIS — Z7189 Other specified counseling: Secondary | ICD-10-CM | POA: Diagnosis not present

## 2021-08-08 DIAGNOSIS — F10239 Alcohol dependence with withdrawal, unspecified: Secondary | ICD-10-CM | POA: Diagnosis not present

## 2021-08-08 DIAGNOSIS — E876 Hypokalemia: Secondary | ICD-10-CM | POA: Diagnosis not present

## 2021-08-08 DIAGNOSIS — E871 Hypo-osmolality and hyponatremia: Secondary | ICD-10-CM | POA: Diagnosis not present

## 2021-08-08 DIAGNOSIS — K838 Other specified diseases of biliary tract: Secondary | ICD-10-CM | POA: Diagnosis not present

## 2021-08-08 DIAGNOSIS — F10939 Alcohol use, unspecified with withdrawal, unspecified: Secondary | ICD-10-CM | POA: Diagnosis not present

## 2021-08-08 DIAGNOSIS — F32A Depression, unspecified: Secondary | ICD-10-CM | POA: Diagnosis not present

## 2021-08-08 DIAGNOSIS — K7010 Alcoholic hepatitis without ascites: Secondary | ICD-10-CM | POA: Diagnosis not present

## 2021-08-08 DIAGNOSIS — F1010 Alcohol abuse, uncomplicated: Secondary | ICD-10-CM | POA: Diagnosis not present

## 2021-08-08 DIAGNOSIS — R0602 Shortness of breath: Secondary | ICD-10-CM | POA: Diagnosis not present

## 2021-08-08 DIAGNOSIS — R079 Chest pain, unspecified: Secondary | ICD-10-CM | POA: Diagnosis not present

## 2021-08-08 DIAGNOSIS — R7989 Other specified abnormal findings of blood chemistry: Secondary | ICD-10-CM | POA: Diagnosis not present

## 2021-08-08 DIAGNOSIS — R251 Tremor, unspecified: Secondary | ICD-10-CM | POA: Diagnosis not present

## 2021-08-08 DIAGNOSIS — E860 Dehydration: Secondary | ICD-10-CM | POA: Diagnosis not present

## 2021-08-09 DIAGNOSIS — F10229 Alcohol dependence with intoxication, unspecified: Secondary | ICD-10-CM | POA: Diagnosis not present

## 2021-08-09 DIAGNOSIS — Y908 Blood alcohol level of 240 mg/100 ml or more: Secondary | ICD-10-CM | POA: Diagnosis not present

## 2021-08-09 DIAGNOSIS — F10939 Alcohol use, unspecified with withdrawal, unspecified: Secondary | ICD-10-CM | POA: Diagnosis not present

## 2021-08-09 DIAGNOSIS — E860 Dehydration: Secondary | ICD-10-CM | POA: Diagnosis not present

## 2021-08-09 DIAGNOSIS — Z7189 Other specified counseling: Secondary | ICD-10-CM | POA: Diagnosis not present

## 2021-08-09 DIAGNOSIS — S7002XA Contusion of left hip, initial encounter: Secondary | ICD-10-CM | POA: Diagnosis not present

## 2021-08-09 DIAGNOSIS — R769 Abnormal immunological finding in serum, unspecified: Secondary | ICD-10-CM | POA: Diagnosis not present

## 2021-08-09 DIAGNOSIS — R7989 Other specified abnormal findings of blood chemistry: Secondary | ICD-10-CM | POA: Diagnosis not present

## 2021-08-09 DIAGNOSIS — Z28310 Unvaccinated for covid-19: Secondary | ICD-10-CM | POA: Diagnosis not present

## 2021-08-09 DIAGNOSIS — E861 Hypovolemia: Secondary | ICD-10-CM | POA: Diagnosis not present

## 2021-08-09 DIAGNOSIS — K838 Other specified diseases of biliary tract: Secondary | ICD-10-CM | POA: Diagnosis not present

## 2021-08-09 DIAGNOSIS — K7010 Alcoholic hepatitis without ascites: Secondary | ICD-10-CM | POA: Diagnosis not present

## 2021-08-09 DIAGNOSIS — F32A Depression, unspecified: Secondary | ICD-10-CM | POA: Diagnosis not present

## 2021-08-09 DIAGNOSIS — E876 Hypokalemia: Secondary | ICD-10-CM | POA: Diagnosis not present

## 2021-08-09 DIAGNOSIS — F1010 Alcohol abuse, uncomplicated: Secondary | ICD-10-CM | POA: Diagnosis not present

## 2021-08-09 DIAGNOSIS — R030 Elevated blood-pressure reading, without diagnosis of hypertension: Secondary | ICD-10-CM | POA: Diagnosis not present

## 2021-08-09 DIAGNOSIS — R079 Chest pain, unspecified: Secondary | ICD-10-CM | POA: Diagnosis not present

## 2021-08-09 DIAGNOSIS — E871 Hypo-osmolality and hyponatremia: Secondary | ICD-10-CM | POA: Diagnosis not present

## 2021-08-09 DIAGNOSIS — Z20822 Contact with and (suspected) exposure to covid-19: Secondary | ICD-10-CM | POA: Diagnosis not present

## 2021-08-09 DIAGNOSIS — R251 Tremor, unspecified: Secondary | ICD-10-CM | POA: Diagnosis not present

## 2021-08-09 DIAGNOSIS — F10239 Alcohol dependence with withdrawal, unspecified: Secondary | ICD-10-CM | POA: Diagnosis not present

## 2021-08-09 DIAGNOSIS — R0602 Shortness of breath: Secondary | ICD-10-CM | POA: Diagnosis not present

## 2021-08-09 DIAGNOSIS — G9340 Encephalopathy, unspecified: Secondary | ICD-10-CM | POA: Diagnosis not present

## 2021-08-09 DIAGNOSIS — R000 Tachycardia, unspecified: Secondary | ICD-10-CM | POA: Diagnosis not present

## 2021-08-09 DIAGNOSIS — D6959 Other secondary thrombocytopenia: Secondary | ICD-10-CM | POA: Diagnosis not present

## 2021-08-13 DIAGNOSIS — Y908 Blood alcohol level of 240 mg/100 ml or more: Secondary | ICD-10-CM | POA: Diagnosis not present

## 2021-08-13 DIAGNOSIS — F332 Major depressive disorder, recurrent severe without psychotic features: Secondary | ICD-10-CM | POA: Diagnosis not present

## 2021-08-13 DIAGNOSIS — Z720 Tobacco use: Secondary | ICD-10-CM | POA: Diagnosis not present

## 2021-08-13 DIAGNOSIS — F10239 Alcohol dependence with withdrawal, unspecified: Secondary | ICD-10-CM | POA: Diagnosis not present

## 2021-08-16 DIAGNOSIS — F332 Major depressive disorder, recurrent severe without psychotic features: Secondary | ICD-10-CM | POA: Diagnosis not present

## 2021-08-16 DIAGNOSIS — F10239 Alcohol dependence with withdrawal, unspecified: Secondary | ICD-10-CM | POA: Diagnosis not present

## 2021-08-19 DIAGNOSIS — Z7189 Other specified counseling: Secondary | ICD-10-CM | POA: Diagnosis not present

## 2021-08-19 DIAGNOSIS — Z131 Encounter for screening for diabetes mellitus: Secondary | ICD-10-CM | POA: Diagnosis not present

## 2021-08-19 DIAGNOSIS — F1020 Alcohol dependence, uncomplicated: Secondary | ICD-10-CM | POA: Diagnosis not present

## 2021-08-19 DIAGNOSIS — Z3202 Encounter for pregnancy test, result negative: Secondary | ICD-10-CM | POA: Diagnosis not present

## 2021-08-19 DIAGNOSIS — F411 Generalized anxiety disorder: Secondary | ICD-10-CM | POA: Diagnosis not present

## 2021-08-19 DIAGNOSIS — Z7141 Alcohol abuse counseling and surveillance of alcoholic: Secondary | ICD-10-CM | POA: Diagnosis not present

## 2021-08-19 DIAGNOSIS — F1010 Alcohol abuse, uncomplicated: Secondary | ICD-10-CM | POA: Diagnosis not present

## 2021-08-28 ENCOUNTER — Encounter: Payer: Self-pay | Admitting: Internal Medicine

## 2021-08-28 ENCOUNTER — Telehealth (INDEPENDENT_AMBULATORY_CARE_PROVIDER_SITE_OTHER): Payer: BC Managed Care – PPO | Admitting: Internal Medicine

## 2021-08-28 ENCOUNTER — Other Ambulatory Visit: Payer: Self-pay

## 2021-08-28 DIAGNOSIS — F32A Depression, unspecified: Secondary | ICD-10-CM

## 2021-08-28 DIAGNOSIS — F1094 Alcohol use, unspecified with alcohol-induced mood disorder: Secondary | ICD-10-CM

## 2021-08-28 DIAGNOSIS — F10982 Alcohol use, unspecified with alcohol-induced sleep disorder: Secondary | ICD-10-CM | POA: Diagnosis not present

## 2021-08-28 DIAGNOSIS — F419 Anxiety disorder, unspecified: Secondary | ICD-10-CM | POA: Diagnosis not present

## 2021-08-28 MED ORDER — QUETIAPINE FUMARATE 200 MG PO TABS: 200.0000 mg | ORAL_TABLET | Freq: Every day | ORAL | 0 refills | Status: DC

## 2021-08-28 NOTE — Progress Notes (Signed)
Virtual Visit via Video Note  I connected with Sherri Harris on 08/28/21 at 11:20 AM EST by a video enabled telemedicine application and verified that I am speaking with the correct person using two identifiers.  Location: Patient: Home  Provider: Office  Person's participating in this video call: Nicki Reaper, NP and [name].   I discussed the limitations of evaluation and management by telemedicine and the availability of in person appointments. The patient expressed understanding and agreed to proceed.  History of Present Illness:  Pt reports difficulty sleeping. She was recently discharged for rehab for recurrent alcoholism on 08/22/21. Since that time, she has been having a hard time sleeping. She has difficulty falling and staying asleep. She was getting Seroquel 200 mg while in rehab and would like a refill of that today. She is only taking Sertraline and Naltrexone at this time.   Past Medical History:  Diagnosis Date   Anxiety    Hypertension     Current Outpatient Medications  Medication Sig Dispense Refill   citalopram (CELEXA) 40 MG tablet Take 1 tablet (40 mg total) by mouth daily. 90 tablet 0   Naltrexone 380 MG SUSR Inject 380 mg into the muscle every 30 (thirty) days. 1.2 each 2   Norethindrone-Ethinyl Estradiol-Fe Biphas (LO LOESTRIN FE) 1 MG-10 MCG / 10 MCG tablet TAKE 1 TABLET BY MOUTH DAILY. 84 tablet 2   ondansetron (ZOFRAN ODT) 4 MG disintegrating tablet Take 1 tablet (4 mg total) by mouth every 8 (eight) hours as needed for nausea or vomiting. 30 tablet 0   No current facility-administered medications for this visit.    Allergies  Allergen Reactions   Amoxicillin Rash    REACTION: rash   Penicillins Rash    Family History  Problem Relation Age of Onset   Hypertension Mother    Thyroid disease Mother    Hypertension Maternal Aunt    Hypertension Maternal Uncle    Alcohol abuse Maternal Uncle    Heart disease Maternal Grandmother    Cancer  Maternal Grandmother        Breast   Hypertension Father    Cardiomyopathy Father    Alcohol abuse Maternal Grandfather    Alcohol abuse Paternal Uncle     Social History   Socioeconomic History   Marital status: Single    Spouse name: Not on file   Number of children: Not on file   Years of education: Not on file   Highest education level: Not on file  Occupational History   Not on file  Tobacco Use   Smoking status: Never   Smokeless tobacco: Never  Vaping Use   Vaping Use: Never used  Substance and Sexual Activity   Alcohol use: Yes    Comment: bottle of wine/day    Drug use: No   Sexual activity: Yes    Birth control/protection: Pill  Other Topics Concern   Not on file  Social History Narrative   Not on file   Social Determinants of Health   Financial Resource Strain: Not on file  Food Insecurity: Not on file  Transportation Needs: Not on file  Physical Activity: Not on file  Stress: Not on file  Social Connections: Not on file  Intimate Partner Violence: Not on file     Constitutional: Denies fever, malaise, fatigue, headache or abrupt weight changes.  Respiratory: Denies difficulty breathing, shortness of breath, cough or sputum production.   Cardiovascular: Denies chest pain, chest tightness, palpitations or swelling in  the hands or feet.  Neurological: Pt reports insomnia. Denies dizziness, difficulty with memory, difficulty with speech or problems with balance and coordination.  Psych: Denies anxiety, depression, SI/HI.  No other specific complaints in a complete review of systems (except as listed in HPI above).  Observations/Objective:  Wt Readings from Last 3 Encounters:  02/13/21 150 lb (68 kg)  11/16/20 140 lb (63.5 kg)  07/24/20 163 lb (73.9 kg)    General: Appears her stated age, well developed, well nourished in NAD. Pulmonary/Chest: Normal effort. No respiratory distress. Neurological: Alert and oriented.  Psychiatric: Mood and affect  normal. Behavior is normal. Judgment and thought content normal.     BMET    Component Value Date/Time   NA 140 02/13/2021 1613   NA 138 01/22/2018 1012   K 3.8 02/13/2021 1613   CL 102 02/13/2021 1613   CO2 22 02/13/2021 1613   GLUCOSE 104 (H) 02/13/2021 1613   BUN 13 02/13/2021 1613   BUN 9 01/22/2018 1012   CREATININE 1.06 (H) 02/13/2021 1613   CREATININE 0.79 11/16/2020 1610   CALCIUM 8.9 02/13/2021 1613   GFRNONAA >60 02/13/2021 1613   GFRAA >60 04/20/2020 1216    Lipid Panel     Component Value Date/Time   CHOL 185 11/16/2020 1610   TRIG 164 (H) 11/16/2020 1610   HDL 70 11/16/2020 1610   CHOLHDL 2.6 11/16/2020 1610   VLDL 32.0 07/18/2019 1535   LDLCALC 89 11/16/2020 1610    CBC    Component Value Date/Time   WBC 8.7 02/13/2021 1613   RBC 5.08 02/13/2021 1613   HGB 16.0 (H) 02/13/2021 1613   HGB 14.5 01/22/2018 1012   HCT 45.9 02/13/2021 1613   HCT 42.2 01/22/2018 1012   PLT 497 (H) 02/13/2021 1613   PLT 337 01/22/2018 1012   MCV 90.4 02/13/2021 1613   MCV 90 01/22/2018 1012   MCH 31.5 02/13/2021 1613   MCHC 34.9 02/13/2021 1613   RDW 13.2 02/13/2021 1613   RDW 12.4 01/22/2018 1012   LYMPHSABS 0.8 04/20/2020 1216   MONOABS 0.3 04/20/2020 1216   EOSABS 0.0 04/20/2020 1216   BASOSABS 0.1 04/20/2020 1216    Hgb A1C No results found for: HGBA1C     Assessment and Plan:   Follow Up Instructions:    I discussed the assessment and treatment plan with the patient. The patient was provided an opportunity to ask questions and all were answered. The patient agreed with the plan and demonstrated an understanding of the instructions.   The patient was advised to call back or seek an in-person evaluation if the symptoms worsen or if the condition fails to improve as anticipated.   Nicki Reaper, NP

## 2021-08-28 NOTE — Assessment & Plan Note (Signed)
Continue Sertraline as prescribed Support offered

## 2021-08-28 NOTE — Assessment & Plan Note (Signed)
Will restart Seroquel 200 mg at bedtime

## 2021-08-28 NOTE — Assessment & Plan Note (Addendum)
She is following with a substance abuse counselor in [place] Will be getting monthly Naltrexone injections

## 2021-08-28 NOTE — Patient Instructions (Signed)

## 2021-08-30 DIAGNOSIS — F1920 Other psychoactive substance dependence, uncomplicated: Secondary | ICD-10-CM | POA: Diagnosis not present

## 2021-08-30 DIAGNOSIS — R1110 Vomiting, unspecified: Secondary | ICD-10-CM | POA: Diagnosis not present

## 2021-08-30 DIAGNOSIS — F1020 Alcohol dependence, uncomplicated: Secondary | ICD-10-CM | POA: Diagnosis not present

## 2021-08-30 DIAGNOSIS — R112 Nausea with vomiting, unspecified: Secondary | ICD-10-CM | POA: Diagnosis not present

## 2021-09-16 DIAGNOSIS — F1020 Alcohol dependence, uncomplicated: Secondary | ICD-10-CM | POA: Diagnosis not present

## 2021-09-16 DIAGNOSIS — F411 Generalized anxiety disorder: Secondary | ICD-10-CM | POA: Diagnosis not present

## 2021-09-16 DIAGNOSIS — F1010 Alcohol abuse, uncomplicated: Secondary | ICD-10-CM | POA: Diagnosis not present

## 2021-09-16 DIAGNOSIS — Z7189 Other specified counseling: Secondary | ICD-10-CM | POA: Diagnosis not present

## 2021-09-16 DIAGNOSIS — Z7141 Alcohol abuse counseling and surveillance of alcoholic: Secondary | ICD-10-CM | POA: Diagnosis not present

## 2021-10-03 DIAGNOSIS — S0181XA Laceration without foreign body of other part of head, initial encounter: Secondary | ICD-10-CM | POA: Diagnosis not present

## 2021-10-03 DIAGNOSIS — K852 Alcohol induced acute pancreatitis without necrosis or infection: Secondary | ICD-10-CM | POA: Diagnosis not present

## 2021-10-03 DIAGNOSIS — R748 Abnormal levels of other serum enzymes: Secondary | ICD-10-CM | POA: Diagnosis not present

## 2021-10-03 DIAGNOSIS — F10239 Alcohol dependence with withdrawal, unspecified: Secondary | ICD-10-CM | POA: Diagnosis not present

## 2021-10-03 DIAGNOSIS — S0993XA Unspecified injury of face, initial encounter: Secondary | ICD-10-CM | POA: Diagnosis not present

## 2021-10-03 DIAGNOSIS — K7010 Alcoholic hepatitis without ascites: Secondary | ICD-10-CM | POA: Diagnosis not present

## 2021-10-03 DIAGNOSIS — F17290 Nicotine dependence, other tobacco product, uncomplicated: Secondary | ICD-10-CM | POA: Diagnosis not present

## 2021-10-03 DIAGNOSIS — R112 Nausea with vomiting, unspecified: Secondary | ICD-10-CM | POA: Diagnosis not present

## 2021-10-03 DIAGNOSIS — E876 Hypokalemia: Secondary | ICD-10-CM | POA: Diagnosis not present

## 2021-10-03 DIAGNOSIS — R251 Tremor, unspecified: Secondary | ICD-10-CM | POA: Diagnosis not present

## 2021-10-03 DIAGNOSIS — S0990XA Unspecified injury of head, initial encounter: Secondary | ICD-10-CM | POA: Diagnosis not present

## 2021-10-03 DIAGNOSIS — S199XXA Unspecified injury of neck, initial encounter: Secondary | ICD-10-CM | POA: Diagnosis not present

## 2021-10-03 DIAGNOSIS — E871 Hypo-osmolality and hyponatremia: Secondary | ICD-10-CM | POA: Diagnosis not present

## 2021-10-03 DIAGNOSIS — F10939 Alcohol use, unspecified with withdrawal, unspecified: Secondary | ICD-10-CM | POA: Diagnosis not present

## 2021-10-03 DIAGNOSIS — W19XXXA Unspecified fall, initial encounter: Secondary | ICD-10-CM | POA: Diagnosis not present

## 2021-10-04 DIAGNOSIS — W19XXXA Unspecified fall, initial encounter: Secondary | ICD-10-CM | POA: Diagnosis not present

## 2021-10-04 DIAGNOSIS — R748 Abnormal levels of other serum enzymes: Secondary | ICD-10-CM | POA: Diagnosis not present

## 2021-10-04 DIAGNOSIS — F17290 Nicotine dependence, other tobacco product, uncomplicated: Secondary | ICD-10-CM | POA: Diagnosis not present

## 2021-10-04 DIAGNOSIS — K7010 Alcoholic hepatitis without ascites: Secondary | ICD-10-CM | POA: Diagnosis not present

## 2021-10-04 DIAGNOSIS — F10939 Alcohol use, unspecified with withdrawal, unspecified: Secondary | ICD-10-CM | POA: Diagnosis not present

## 2021-10-04 DIAGNOSIS — F10239 Alcohol dependence with withdrawal, unspecified: Secondary | ICD-10-CM | POA: Diagnosis not present

## 2021-10-04 DIAGNOSIS — S0181XA Laceration without foreign body of other part of head, initial encounter: Secondary | ICD-10-CM | POA: Diagnosis not present

## 2021-10-04 DIAGNOSIS — R112 Nausea with vomiting, unspecified: Secondary | ICD-10-CM | POA: Diagnosis not present

## 2021-10-04 DIAGNOSIS — E876 Hypokalemia: Secondary | ICD-10-CM | POA: Diagnosis not present

## 2021-10-04 DIAGNOSIS — E871 Hypo-osmolality and hyponatremia: Secondary | ICD-10-CM | POA: Diagnosis not present

## 2021-10-09 ENCOUNTER — Other Ambulatory Visit: Payer: Self-pay

## 2021-10-15 ENCOUNTER — Other Ambulatory Visit: Payer: Self-pay

## 2021-10-15 MED ORDER — ONDANSETRON HCL 4 MG PO TABS: 4.0000 mg | ORAL_TABLET | Freq: Three times a day (TID) | ORAL | 0 refills | Status: DC | PRN

## 2021-10-25 DIAGNOSIS — F1020 Alcohol dependence, uncomplicated: Secondary | ICD-10-CM | POA: Diagnosis not present

## 2021-10-25 DIAGNOSIS — Z0489 Encounter for examination and observation for other specified reasons: Secondary | ICD-10-CM | POA: Diagnosis not present

## 2021-10-25 DIAGNOSIS — Y909 Presence of alcohol in blood, level not specified: Secondary | ICD-10-CM | POA: Diagnosis not present

## 2021-10-25 DIAGNOSIS — F17290 Nicotine dependence, other tobacco product, uncomplicated: Secondary | ICD-10-CM | POA: Diagnosis not present

## 2021-10-28 DIAGNOSIS — R0789 Other chest pain: Secondary | ICD-10-CM | POA: Diagnosis not present

## 2021-10-28 DIAGNOSIS — F10139 Alcohol abuse with withdrawal, unspecified: Secondary | ICD-10-CM | POA: Diagnosis not present

## 2021-10-28 DIAGNOSIS — F411 Generalized anxiety disorder: Secondary | ICD-10-CM | POA: Diagnosis not present

## 2021-10-28 DIAGNOSIS — F1020 Alcohol dependence, uncomplicated: Secondary | ICD-10-CM | POA: Diagnosis not present

## 2021-10-28 DIAGNOSIS — Z20822 Contact with and (suspected) exposure to covid-19: Secondary | ICD-10-CM | POA: Diagnosis not present

## 2021-10-28 DIAGNOSIS — J189 Pneumonia, unspecified organism: Secondary | ICD-10-CM | POA: Diagnosis not present

## 2021-10-28 DIAGNOSIS — I10 Essential (primary) hypertension: Secondary | ICD-10-CM | POA: Diagnosis not present

## 2021-10-28 DIAGNOSIS — R7989 Other specified abnormal findings of blood chemistry: Secondary | ICD-10-CM | POA: Diagnosis not present

## 2021-10-28 DIAGNOSIS — Z880 Allergy status to penicillin: Secondary | ICD-10-CM | POA: Diagnosis not present

## 2021-10-28 DIAGNOSIS — R251 Tremor, unspecified: Secondary | ICD-10-CM | POA: Diagnosis not present

## 2021-10-28 DIAGNOSIS — F17210 Nicotine dependence, cigarettes, uncomplicated: Secondary | ICD-10-CM | POA: Diagnosis not present

## 2021-10-28 DIAGNOSIS — F10239 Alcohol dependence with withdrawal, unspecified: Secondary | ICD-10-CM | POA: Diagnosis not present

## 2021-10-28 DIAGNOSIS — Z79899 Other long term (current) drug therapy: Secondary | ICD-10-CM | POA: Diagnosis not present

## 2021-10-28 DIAGNOSIS — Y902 Blood alcohol level of 40-59 mg/100 ml: Secondary | ICD-10-CM | POA: Diagnosis not present

## 2021-10-28 DIAGNOSIS — F332 Major depressive disorder, recurrent severe without psychotic features: Secondary | ICD-10-CM | POA: Diagnosis not present

## 2021-10-28 DIAGNOSIS — R945 Abnormal results of liver function studies: Secondary | ICD-10-CM | POA: Diagnosis not present

## 2021-10-28 DIAGNOSIS — R296 Repeated falls: Secondary | ICD-10-CM | POA: Diagnosis not present

## 2021-10-28 DIAGNOSIS — Z8249 Family history of ischemic heart disease and other diseases of the circulatory system: Secondary | ICD-10-CM | POA: Diagnosis not present

## 2021-11-06 DIAGNOSIS — F1020 Alcohol dependence, uncomplicated: Secondary | ICD-10-CM | POA: Diagnosis not present

## 2021-11-19 DIAGNOSIS — F1020 Alcohol dependence, uncomplicated: Secondary | ICD-10-CM | POA: Diagnosis not present

## 2021-11-19 DIAGNOSIS — F411 Generalized anxiety disorder: Secondary | ICD-10-CM | POA: Diagnosis not present

## 2021-12-04 ENCOUNTER — Other Ambulatory Visit: Payer: Self-pay | Admitting: Internal Medicine

## 2021-12-04 NOTE — Telephone Encounter (Signed)
Requested medication (s) are due for refill today: Yes  Requested medication (s) are on the active medication list: Yes  Last refill:  08/28/21  Future visit scheduled: No  Notes to clinic:  See request.    Requested Prescriptions  Pending Prescriptions Disp Refills   QUEtiapine (SEROQUEL) 200 MG tablet [Pharmacy Med Name: QUETIAPINE 200MG  TABLETS] 90 tablet 0    Sig: TAKE 1 TABLET(200 MG) BY MOUTH AT BEDTIME     Not Delegated - Psychiatry:  Antipsychotics - Second Generation (Atypical) - quetiapine Failed - 12/04/2021 11:06 AM      Failed - This refill cannot be delegated      Failed - TSH in normal range and within 360 days    TSH  Date Value Ref Range Status  07/18/2019 0.63 0.35 - 4.50 uIU/mL Final          Failed - Completed PHQ-2 or PHQ-9 in the last 360 days      Failed - Last BP in normal range    BP Readings from Last 1 Encounters:  08/28/21 140/84          Failed - Last Heart Rate in normal range    Pulse Readings from Last 1 Encounters:  02/14/21 (!) 111          Failed - Lipid Panel in normal range within the last 12 months    Cholesterol  Date Value Ref Range Status  11/16/2020 185 <200 mg/dL Final   LDL Cholesterol (Calc)  Date Value Ref Range Status  11/16/2020 89 mg/dL (calc) Final    Comment:    Reference range: <100 . Desirable range <100 mg/dL for primary prevention;   <70 mg/dL for patients with CHD or diabetic patients  with > or = 2 CHD risk factors. Marland Kitchen LDL-C is now calculated using the Martin-Hopkins  calculation, which is a validated novel method providing  better accuracy than the Friedewald equation in the  estimation of LDL-C.  Cresenciano Genre et al. Annamaria Helling. [id number]): 2061-2068  (http://education.[employer].com/faq/FAQ164)    HDL  Date Value Ref Range Status  11/16/2020 70 > OR = 50 mg/dL Final   Triglycerides  Date Value Ref Range Status  11/16/2020 164 (H) <150 mg/dL Final         Failed - CBC within normal limits  and completed in the last 12 months    WBC  Date Value Ref Range Status  02/13/2021 8.7 4.0 - 10.5 K/uL Final  01/13/2019   Final    Comment:    see scanned report   RBC  Date Value Ref Range Status  02/13/2021 5.08 3.87 - 5.11 MIL/uL Final   Hemoglobin  Date Value Ref Range Status  02/13/2021 16.0 (H) 12.0 - 15.0 g/dL Final  01/22/2018 14.5 11.1 - 15.9 g/dL Final   HCT  Date Value Ref Range Status  02/13/2021 45.9 36.0 - 46.0 % Final   Hematocrit  Date Value Ref Range Status  01/22/2018 42.2 34.0 - 46.6 % Final   MCHC  Date Value Ref Range Status  02/13/2021 34.9 30.0 - 36.0 g/dL Final   [facility]  Date Value Ref Range Status  02/13/2021 31.5 26.0 - 34.0 pg Final   MCV  Date Value Ref Range Status  02/13/2021 90.4 80.0 - 100.0 fL Final  01/22/2018 90 79 - 97 fL Final   No results found for: PLTCOUNTKUC, LABPLAT, POCPLA RDW  Date Value Ref Range Status  02/13/2021 13.2 11.5 - 15.5 % Final  01/22/2018 12.4  12.3 - 15.4 % Final         Passed - Valid encounter within last 6 months    Recent Outpatient Visits           3 months ago 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[place])   [facility], Coralie Keens, NP   5 months ago Alcohol use with alcohol-induced mood disorder [facility])   [facility], Coralie Keens, NP   5 years ago Kyphosis of cervical region, unspecified kyphosis type   [facility], Gwen Her, MD   5 years ago Acute cystitis with hematuria   [facility], Rebekah [place], MD   6 years ago Left knee pain   [facility], Gwen Her, MD              Passed - CMP within normal limits and completed in the last 12 months    Albumin  Date Value Ref Range Status  02/13/2021 4.7 3.5 - 5.0 g/dL Final   Alkaline Phosphatase  Date Value Ref Range Status  02/13/2021 61 38 - 126 U/L Final   Alkaline phosphatase  (APISO)  Date Value Ref Range Status  11/16/2020 67 31 - 125 U/L Final   ALT  Date Value Ref Range Status  02/13/2021 26 0 - 44 U/L Final   AST  Date Value Ref Range Status  02/13/2021 31 15 - 41 U/L Final   BUN  Date Value Ref Range Status  02/13/2021 13 6 - 20 mg/dL Final  01/22/2018 9 6 - 20 mg/dL Final   Calcium  Date Value Ref Range Status  02/13/2021 8.9 8.9 - 10.3 mg/dL Final   CO2  Date Value Ref Range Status  02/13/2021 22 22 - 32 mmol/L Final   Creat  Date Value Ref Range Status  11/16/2020 0.79 0.50 - 1.10 mg/dL Final   Creatinine, Ser  Date Value Ref Range Status  02/13/2021 1.06 (H) 0.44 - 1.00 mg/dL Final   Glucose, Bld  Date Value Ref Range Status  02/13/2021 104 (H) 70 - 99 mg/dL Final    Comment:    Glucose reference range applies only to samples taken after fasting for at least 8 hours.   Potassium  Date Value Ref Range Status  02/13/2021 3.8 3.5 - 5.1 mmol/L Final   Sodium  Date Value Ref Range Status  02/13/2021 140 135 - 145 mmol/L Final  01/22/2018 138 134 - 144 mmol/L Final   Total Bilirubin  Date Value Ref Range Status  02/13/2021 0.7 0.3 - 1.2 mg/dL Final   Protein, ur  Date Value Ref Range Status  08/04/2019 100 (A) NEGATIVE mg/dL Final   Total Protein  Date Value Ref Range Status  02/13/2021 8.0 6.5 - 8.1 g/dL Final   GFR calc Af Amer  Date Value Ref Range Status  04/20/2020 >60 >60 mL/min Final   GFR  Date Value Ref Range Status  07/18/2019 95.76 >60.00 mL/min Final   GFR, Estimated  Date Value Ref Range Status  02/13/2021 >60 >60 mL/min Final    Comment:    (NOTE) Calculated using the CKD-EPI Creatinine Equation (2021)

## 2021-12-19 DIAGNOSIS — R7401 Elevation of levels of liver transaminase levels: Secondary | ICD-10-CM | POA: Diagnosis not present

## 2021-12-19 DIAGNOSIS — R000 Tachycardia, unspecified: Secondary | ICD-10-CM | POA: Diagnosis not present

## 2021-12-19 DIAGNOSIS — F10139 Alcohol abuse with withdrawal, unspecified: Secondary | ICD-10-CM | POA: Diagnosis not present

## 2021-12-19 DIAGNOSIS — F1020 Alcohol dependence, uncomplicated: Secondary | ICD-10-CM | POA: Diagnosis not present

## 2021-12-19 DIAGNOSIS — R030 Elevated blood-pressure reading, without diagnosis of hypertension: Secondary | ICD-10-CM | POA: Diagnosis not present

## 2021-12-20 DIAGNOSIS — R4020 Unspecified coma: Secondary | ICD-10-CM | POA: Diagnosis not present

## 2021-12-20 DIAGNOSIS — F10232 Alcohol dependence with withdrawal with perceptual disturbance: Secondary | ICD-10-CM | POA: Diagnosis not present

## 2021-12-20 DIAGNOSIS — R000 Tachycardia, unspecified: Secondary | ICD-10-CM | POA: Diagnosis not present

## 2021-12-20 DIAGNOSIS — F10139 Alcohol abuse with withdrawal, unspecified: Secondary | ICD-10-CM | POA: Diagnosis not present

## 2021-12-20 DIAGNOSIS — R9431 Abnormal electrocardiogram [ECG] [EKG]: Secondary | ICD-10-CM | POA: Diagnosis not present

## 2021-12-20 DIAGNOSIS — K838 Other specified diseases of biliary tract: Secondary | ICD-10-CM | POA: Diagnosis not present

## 2021-12-20 DIAGNOSIS — R404 Transient alteration of awareness: Secondary | ICD-10-CM | POA: Diagnosis not present

## 2021-12-20 DIAGNOSIS — K7010 Alcoholic hepatitis without ascites: Secondary | ICD-10-CM | POA: Diagnosis not present

## 2021-12-20 DIAGNOSIS — S0990XA Unspecified injury of head, initial encounter: Secondary | ICD-10-CM | POA: Diagnosis not present

## 2021-12-20 DIAGNOSIS — M25512 Pain in left shoulder: Secondary | ICD-10-CM | POA: Diagnosis not present

## 2021-12-20 DIAGNOSIS — T50904A Poisoning by unspecified drugs, medicaments and biological substances, undetermined, initial encounter: Secondary | ICD-10-CM | POA: Diagnosis not present

## 2021-12-20 DIAGNOSIS — F1020 Alcohol dependence, uncomplicated: Secondary | ICD-10-CM | POA: Diagnosis not present

## 2021-12-20 DIAGNOSIS — K760 Fatty (change of) liver, not elsewhere classified: Secondary | ICD-10-CM | POA: Diagnosis not present

## 2021-12-20 DIAGNOSIS — F10220 Alcohol dependence with intoxication, uncomplicated: Secondary | ICD-10-CM | POA: Diagnosis not present

## 2021-12-20 DIAGNOSIS — T5191XA Toxic effect of unspecified alcohol, accidental (unintentional), initial encounter: Secondary | ICD-10-CM | POA: Diagnosis not present

## 2021-12-20 DIAGNOSIS — F10229 Alcohol dependence with intoxication, unspecified: Secondary | ICD-10-CM | POA: Diagnosis not present

## 2021-12-20 DIAGNOSIS — R030 Elevated blood-pressure reading, without diagnosis of hypertension: Secondary | ICD-10-CM | POA: Diagnosis not present

## 2021-12-20 DIAGNOSIS — Z4682 Encounter for fitting and adjustment of non-vascular catheter: Secondary | ICD-10-CM | POA: Diagnosis not present

## 2021-12-20 DIAGNOSIS — F411 Generalized anxiety disorder: Secondary | ICD-10-CM | POA: Diagnosis not present

## 2021-12-20 DIAGNOSIS — G9340 Encephalopathy, unspecified: Secondary | ICD-10-CM | POA: Diagnosis not present

## 2021-12-20 DIAGNOSIS — F1110 Opioid abuse, uncomplicated: Secondary | ICD-10-CM | POA: Diagnosis not present

## 2021-12-20 DIAGNOSIS — K700 Alcoholic fatty liver: Secondary | ICD-10-CM | POA: Diagnosis not present

## 2021-12-20 DIAGNOSIS — E876 Hypokalemia: Secondary | ICD-10-CM | POA: Diagnosis not present

## 2021-12-20 DIAGNOSIS — S8011XA Contusion of right lower leg, initial encounter: Secondary | ICD-10-CM | POA: Diagnosis not present

## 2021-12-20 DIAGNOSIS — S8012XA Contusion of left lower leg, initial encounter: Secondary | ICD-10-CM | POA: Diagnosis not present

## 2021-12-20 DIAGNOSIS — S40011A Contusion of right shoulder, initial encounter: Secondary | ICD-10-CM | POA: Diagnosis not present

## 2021-12-20 DIAGNOSIS — F17290 Nicotine dependence, other tobacco product, uncomplicated: Secondary | ICD-10-CM | POA: Diagnosis not present

## 2021-12-20 DIAGNOSIS — F1310 Sedative, hypnotic or anxiolytic abuse, uncomplicated: Secondary | ICD-10-CM | POA: Diagnosis not present

## 2021-12-20 DIAGNOSIS — F10129 Alcohol abuse with intoxication, unspecified: Secondary | ICD-10-CM | POA: Diagnosis not present

## 2021-12-20 DIAGNOSIS — R7401 Elevation of levels of liver transaminase levels: Secondary | ICD-10-CM | POA: Diagnosis not present

## 2021-12-20 DIAGNOSIS — R0689 Other abnormalities of breathing: Secondary | ICD-10-CM | POA: Diagnosis not present

## 2021-12-20 DIAGNOSIS — S40012A Contusion of left shoulder, initial encounter: Secondary | ICD-10-CM | POA: Diagnosis not present

## 2021-12-20 DIAGNOSIS — J9601 Acute respiratory failure with hypoxia: Secondary | ICD-10-CM | POA: Diagnosis not present

## 2021-12-20 DIAGNOSIS — K828 Other specified diseases of gallbladder: Secondary | ICD-10-CM | POA: Diagnosis not present

## 2021-12-20 DIAGNOSIS — S199XXA Unspecified injury of neck, initial encounter: Secondary | ICD-10-CM | POA: Diagnosis not present

## 2021-12-20 DIAGNOSIS — T68XXXA Hypothermia, initial encounter: Secondary | ICD-10-CM | POA: Diagnosis not present

## 2022-01-04 ENCOUNTER — Encounter: Payer: Self-pay | Admitting: Internal Medicine

## 2022-01-06 ENCOUNTER — Ambulatory Visit: Payer: Self-pay | Admitting: *Deleted

## 2022-01-06 ENCOUNTER — Encounter: Payer: Self-pay | Admitting: Physician Assistant

## 2022-01-06 ENCOUNTER — Ambulatory Visit (INDEPENDENT_AMBULATORY_CARE_PROVIDER_SITE_OTHER): Payer: BC Managed Care – PPO | Admitting: Physician Assistant

## 2022-01-06 ENCOUNTER — Other Ambulatory Visit: Payer: Self-pay

## 2022-01-06 VITALS — BP 136/92 | HR 113 | Temp 97.7°F | Wt 134.0 lb

## 2022-01-06 DIAGNOSIS — G4700 Insomnia, unspecified: Secondary | ICD-10-CM | POA: Diagnosis not present

## 2022-01-06 DIAGNOSIS — F1094 Alcohol use, unspecified with alcohol-induced mood disorder: Secondary | ICD-10-CM | POA: Diagnosis not present

## 2022-01-06 DIAGNOSIS — Z3009 Encounter for other general counseling and advice on contraception: Secondary | ICD-10-CM

## 2022-01-06 DIAGNOSIS — Z3202 Encounter for pregnancy test, result negative: Secondary | ICD-10-CM

## 2022-01-06 DIAGNOSIS — F419 Anxiety disorder, unspecified: Secondary | ICD-10-CM

## 2022-01-06 DIAGNOSIS — F32A Depression, unspecified: Secondary | ICD-10-CM

## 2022-01-06 DIAGNOSIS — M25512 Pain in left shoulder: Secondary | ICD-10-CM

## 2022-01-06 DIAGNOSIS — F1010 Alcohol abuse, uncomplicated: Secondary | ICD-10-CM | POA: Diagnosis not present

## 2022-01-06 MED ORDER — SERTRALINE HCL 50 MG PO TABS: 50.0000 mg | ORAL_TABLET | Freq: Every day | ORAL | 1 refills | Status: DC

## 2022-01-06 MED ORDER — GABAPENTIN 300 MG PO CAPS: 300.0000 mg | ORAL_CAPSULE | Freq: Three times a day (TID) | ORAL | 0 refills | Status: DC

## 2022-01-06 MED ORDER — THIAMINE HCL 100 MG PO TABS: 100.0000 mg | ORAL_TABLET | Freq: Every morning | ORAL | 0 refills | Status: DC

## 2022-01-06 MED ORDER — QUETIAPINE FUMARATE 100 MG PO TABS: 100.0000 mg | ORAL_TABLET | Freq: Every day | ORAL | 0 refills | Status: DC

## 2022-01-06 MED ORDER — NORETHIN-ETH ESTRAD-FE BIPHAS 1 MG-10 MCG / 10 MCG PO TABS: 1.0000 | ORAL_TABLET | Freq: Every day | ORAL | 0 refills | Status: DC

## 2022-01-06 NOTE — Progress Notes (Signed)
? ?Established Patient Office Visit ? ?Subjective:  ?Patient ID: Sherri Harris, female    DOB: Feb 26, 1996  Age: 26 y.o. MRN: [id number] ? ?Today's Provider: Talitha Givens, MHS, PA-C ?Introduced myself to the patient as a Journalist, newspaper and provided education on APPs in clinical practice.  ? ? ? ?CC:  ?Chief Complaint  ?Patient presents with  ? Shoulder Pain  ?  Left shoulder pain ?  ? Insomnia  ? ? ?HPI ?Sherri Harris presents for left shoulder pain  ? ?She is here with her Mother who is providing most of HPI ?Mother states that patient was found 2 weeks ago unconscious from overdose and was placed on ventilator ?Was hospitalized for approx 6 days total  ? ?States she does not have anything for anxiety management other than Buspar ?They are interested in trying to get on Naltrexone and establishing with a psychiatry provider  ? ?They would like a pregnancy test today and refill for birth control if appropriate ? ? ?She is taking Ibuprofen for shoulder pain but states this is not helping  ? ? ? ? ?Past Medical History:  ?Diagnosis Date  ? Anxiety   ? Hypertension   ? ? ?Past Surgical History:  ?Procedure Laterality Date  ? TYMPANOSTOMY TUBE PLACEMENT    ? WISDOM TOOTH EXTRACTION    ? ? ?Family History  ?Problem Relation Age of Onset  ? Hypertension Mother   ? Thyroid disease Mother   ? Hypertension Maternal Aunt   ? Hypertension Maternal Uncle   ? Alcohol abuse Maternal Uncle   ? Heart disease Maternal Grandmother   ? Cancer Maternal Grandmother   ?     Breast  ? Hypertension Father   ? Cardiomyopathy Father   ? Alcohol abuse Maternal Grandfather   ? Alcohol abuse Paternal Uncle   ? ? ?Social History  ? ?Socioeconomic History  ? Marital status: Single  ?  Spouse name: Not on file  ? Number of children: Not on file  ? Years of education: Not on file  ? Highest education level: Not on file  ?Occupational History  ? Not on file  ?Tobacco Use  ? Smoking status: Never  ? Smokeless tobacco: Never  ?Vaping Use  ? Vaping Use: Never  used  ?Substance and Sexual Activity  ? Alcohol use: Yes  ?  Comment: bottle of wine/day   ? Drug use: No  ? Sexual activity: Yes  ?  Birth control/protection: Pill  ?Other Topics Concern  ? Not on file  ?Social History Narrative  ? Not on file  ? ?Social Determinants of Health  ? ?Financial Resource Strain: Not on file  ?Food Insecurity: Not on file  ?Transportation Needs: Not on file  ?Physical Activity: Not on file  ?Stress: Not on file  ?Social Connections: Not on file  ?Intimate Partner Violence: Not on file  ? ? ?Outpatient Medications Prior to Visit  ?Medication Sig Dispense Refill  ? busPIRone (BUSPAR) 5 MG tablet Take 5 mg by mouth 3 (three) times daily.    ? Norethindrone-Ethinyl Estradiol-Fe Biphas (LO LOESTRIN FE) 1 MG-10 MCG / 10 MCG tablet TAKE 1 TABLET BY MOUTH DAILY. (Patient not taking: Reported on 01/06/2022) 84 tablet 2  ? ondansetron (ZOFRAN) 4 MG tablet Take 1 tablet (4 mg total) by mouth every 8 (eight) hours as needed for nausea or vomiting. (Patient not taking: Reported on 01/06/2022) 20 tablet 0  ? QUEtiapine (SEROQUEL) 200 MG tablet Take 1 tablet (200 mg total) by  mouth at bedtime. (Patient not taking: Reported on 01/06/2022) 90 tablet 0  ? sertraline (ZOLOFT) 25 MG tablet Take 25 mg by mouth daily. (Patient not taking: Reported on 01/06/2022)    ? thiamine 100 MG tablet Take 100 mg by mouth every morning. (Patient not taking: Reported on 01/06/2022)    ? ?No facility-administered medications prior to visit.  ? ? ?Allergies  ?Allergen Reactions  ? Amoxicillin Rash  ?  REACTION: rash  ? Penicillins Rash  ? ? ?ROS ?Review of Systems ? ?  ?Objective:  ?  ?Physical Exam ?Vitals reviewed.  ?Constitutional:   ?   Appearance: Normal appearance.  ?HENT:  ?   Head: Normocephalic and atraumatic.  ?Eyes:  ?   Conjunctiva/sclera: Conjunctivae normal.  ?Pulmonary:  ?   Effort: Pulmonary effort is normal.  ?   Breath sounds: Normal breath sounds.  ?Musculoskeletal:  ?   Right shoulder: No swelling, deformity,  tenderness, bony tenderness or crepitus. Normal range of motion.  ?   Left shoulder: Swelling and tenderness present. No deformity, bony tenderness or crepitus. Decreased range of motion.  ?   Comments: 2 cm diameter nodule on lateral aspect of deltoid/shoulder from IO IV admin  ?Shoulder ROM is painful but intact on exam for internal and external rotation, abduction  ?Neurological:  ?   General: No focal deficit present.  ?   Mental Status: She is alert.  ?   Motor: No weakness.  ?   Coordination: Coordination normal.  ?Psychiatric:     ?   Mood and Affect: Mood normal.     ?   Behavior: Behavior normal.     ?   Thought Content: Thought content normal.     ?   Judgment: Judgment normal.  ? ? ?BP (!) 136/92 (BP Location: Right Arm, Patient Position: Sitting, Cuff Size: Normal)   Pulse (!) 113   Temp 97.7 ?F (36.5 ?C) (Temporal)   Wt 134 lb (60.8 kg)   SpO2 99%   BMI 19.23 kg/m?  ?Wt Readings from Last 3 Encounters:  ?01/06/22 134 lb (60.8 kg)  ?02/13/21 150 lb (68 kg)  ?11/16/20 140 lb (63.5 kg)  ? ? ? ?Health Maintenance Due  ?Topic Date Due  ? COVID-19 Vaccine (2 - Pfizer series) 07/07/2020  ? ? ?There are no preventive care reminders to display for this patient. ? ?Lab Results  ?Component Value Date  ? TSH 0.63 07/18/2019  ? ?Lab Results  ?Component Value Date  ? WBC 8.7 02/13/2021  ? HGB 16.0 (H) 02/13/2021  ? HCT 45.9 02/13/2021  ? MCV 90.4 02/13/2021  ? PLT 497 (H) 02/13/2021  ? ?Lab Results  ?Component Value Date  ? NA 140 02/13/2021  ? K 3.8 02/13/2021  ? CO2 22 02/13/2021  ? GLUCOSE 104 (H) 02/13/2021  ? BUN 13 02/13/2021  ? CREATININE 1.06 (H) 02/13/2021  ? BILITOT 0.7 02/13/2021  ? ALKPHOS 61 02/13/2021  ? AST 31 02/13/2021  ? ALT 26 02/13/2021  ? PROT 8.0 02/13/2021  ? ALBUMIN 4.7 02/13/2021  ? CALCIUM 8.9 02/13/2021  ? ANIONGAP 16 (H) 02/13/2021  ? GFR 95.76 07/18/2019  ? ?Lab Results  ?Component Value Date  ? CHOL 185 11/16/2020  ? ?Lab Results  ?Component Value Date  ? HDL 70 11/16/2020  ? ?Lab  Results  ?Component Value Date  ? [place] 89 11/16/2020  ? ?Lab Results  ?Component Value Date  ? TRIG 164 (H) 11/16/2020  ? ?Lab Results  ?Component  Value Date  ? CHOLHDL 2.6 11/16/2020  ? ?No results found for: HGBA1C ? ?  ?Assessment & Plan:  ? ?Problem List Items Addressed This Visit   ? ?  ? Nervous and Auditory  ? Alcohol use with alcohol-induced mood disorder ([place]) - Primary  ?  Chronic, ongoing,  ?Patient presents with her mother and they state she has been sober for almost 10 days following recent hospitalization ?Will provide resources for psychiatry as well as urgent referral to assist with continued sobriety efforts ?Will provide Sertraline 50 mg PO QD to assist with anxiety and depression,  ?Seroquel 100mg  PO QHS to provide further insomnia, anxiety and mood regulation ?Close Follow up recommended to monitor progress ?Patient would like to reestablish care here and receive injections to assist with sobriety ? ?  ?  ? Relevant Orders  ? Ambulatory referral to Psychiatry  ?  ? Other  ? Insomnia  ?  Appears chronic and likely associated with withdrawal and anxiety ?Discussed role of Gabapentin to assist with anxiety and insomnia but patient was adamant that Seroquel 100mg  was more preferable at this time ?Will provide script for Seroquel 100mg  PO QHS  with future fill date of 01-10-2022 so patient can try recommended Gabapentin taper first  ?Discussed options for anxiety and insomnia with patient and her mother- I am not comfortable nor do I recommend benzos or hypnotics given previous addictive behaviors and LOC  ?Recommend close follow up and Psychiatry eval/ management to assist with insomnia, anxiety, depression and substance abuse ? ?  ?  ? Anxiety and depression  ?  Chronic, appears unstable and currently unmanaged ?Will attempt to restart Sertraline at 50 mg PO QD along with gabapentin 300mg  per taper to assist with acute anxiety ?Provided future fill script for Seroquel 100 mg PO QHS to assist with  augmentation of Sertraline and aid in sleep  ?Recommend psychiatry eval and further management to assist with mental health concerns and substance abuse  ?Follow up every 4-6 weeks would likely be beneficial

## 2022-01-06 NOTE — Assessment & Plan Note (Signed)
Chronic, ongoing,  ?Patient presents with her mother and they state she has been sober for almost 10 days following recent hospitalization ?Will provide resources for psychiatry as well as urgent referral to assist with continued sobriety efforts ?Will provide Sertraline 50 mg PO QD to assist with anxiety and depression,  ?Seroquel 100mg  PO QHS to provide further insomnia, anxiety and mood regulation ?Close Follow up recommended to monitor progress ?Patient would like to reestablish care here and receive injections to assist with sobriety ? ?

## 2022-01-06 NOTE — Assessment & Plan Note (Signed)
Chronic, likely unstable ?Patient was recently found unconscious, was intubated and hospitalized 12-20-21 to 12-26-2021, appears to have undergone alcohol detox in hospital and states today that she is sober and living with mother ?Will assist with detox and associated anxiety as follows  ?Gabapentin 300 mg PO per these guidelines  ?Day 1 - 300 mg every 6 hours ?Day 2 - 300 mg every 8 hours ?Day 3 - 300 mg every 12 hours ?Day 4-7 - 300 mg one dose  ?May continue with one tablet QHS after this taper as needed to assist with sleep ?Will restart thiamine supplementation and recheck thiamine, b12, folate, cmp, cbc today ?Provided psych referral along with community resources to assist with substance abuse care and management ?Patient was encouraged to keep her upcoming apt with PCP on 4-3 to discuss progress and getting on further maintenance medications- Naltrexone and Vivitrol per patient report ? ? ?

## 2022-01-06 NOTE — Assessment & Plan Note (Signed)
Acute, secondary to IO IV used when she was found on 12/20/2021 by EMS ?PE was reassuring today - mild swelling over IV site, no erythema, no joint swelling  ?Offered DG imaging to provide peace of mind and rule out infection or bony injury- patient declined, more interested in something for pain ?Will provide Gabapentin to assist with acute discomfort, also recommend Ibuprofen for further pain management as well as warm compresses  ?Follow up as needed for persistent symptoms ?

## 2022-01-06 NOTE — Assessment & Plan Note (Signed)
Chronic, appears unstable and currently unmanaged ?Will attempt to restart Sertraline at 50 mg PO QD along with gabapentin 300mg  per taper to assist with acute anxiety ?Provided future fill script for Seroquel 100 mg PO QHS to assist with augmentation of Sertraline and aid in sleep  ?Recommend psychiatry eval and further management to assist with mental health concerns and substance abuse  ?Follow up every 4-6 weeks would likely be beneficial to assist with sobriety and stability.  ?

## 2022-01-06 NOTE — Telephone Encounter (Signed)
?  Chief Complaint: question ?Symptoms: about daughter ?Frequency: na ?Pertinent Negatives: Patient denies na ?Disposition: [] ED /[] Urgent Care (no appt availability in office) / [] Appointment(In office/virtual)/ []  [facility]/ [] Home Care/ [] Refused Recommended Disposition /[] [facility]/ []  Follow-up with PCP ?Additional Notes: has appt this afternoon. No longer needs to discuss. ? ?Reason for Disposition ? [1] Follow-up call to recent contact AND [2] information only call, no triage required ? ?Answer Assessment - Initial Assessment Questions ?1. REASON FOR CALL or QUESTION: "What is your reason for calling today?" or "How can I best help you?" or "What question do you have that I can help answer?" ?    Since pt's mother called this morning she has been able to obtain an appt this afternoon. Therefore she did not want to discuss now, she will just keep appt. ? ?Protocols used: Information Only Call - No Triage-A-AH ? ?

## 2022-01-06 NOTE — Assessment & Plan Note (Signed)
Appears chronic and likely associated with withdrawal and anxiety ?Discussed role of Gabapentin to assist with anxiety and insomnia but patient was adamant that Seroquel 100mg  was more preferable at this time ?Will provide script for Seroquel 100mg  PO QHS  with future fill date of 01-10-2022 so patient can try recommended Gabapentin taper first  ?Discussed options for anxiety and insomnia with patient and her mother- I am not comfortable nor do I recommend benzos or hypnotics given previous addictive behaviors and LOC  ?Recommend close follow up and Psychiatry eval/ management to assist with insomnia, anxiety, depression and substance abuse ? ?

## 2022-01-06 NOTE — Patient Instructions (Addendum)
? ?  I am going to provide the follow medications to assist with your anxiety and alcohol withdrawal symptoms ?I would like you to take the Gabapentin 300 mg according to the following schedule  ? ?Day 1 - 300 mg every 6 hours ?Day 2 - 300 mg every 8 hours ?Day 3 - 300 mg every 12 hours ?Day 4-7 - 300 mg one dose ? ?I am restarting your Sertraline at 50 mg to be taken by mouth every day to assist with anxiety and depression ?Please resume your thiamine supplement to help reduce deficiency ? ?I will provide a script for Seroquel 100 mg that will be future filled at the end of the week to assist with sleep if the Gabapentin is not working.  ? ?I have sent in a urgent referral to Psychiatry so the referral team should call you soon to set up an apt.  ? ? ?These offices have both PSYCHIATRY doctors and THERAPISTS ? ? ?[facility] (Virtual Available) ?[place] ?[address] ?[address] ?[place], [place] [address] ?Phone: [phone] ? ?[facility] ?Address: [address] [place], [place] [address] ?[email or web address] ?Phone: [phone] ? ?[facility]) ?Address: [address], [place], [place] [address] ?Hours: 8:30AM-5PM ?Phone: [phone] ? ?[facility] ?[address] ?[place],  [place]  [address] ?Phone: [phone] ?Fax: [phone] ? ?[facility] ?[address] ?Ground Floor ?[place], [place] [address] ?Phone: [phone] ? ?[facility] (All [facility]) ?[address], [address] ?[place], [id number] ?Phone: [phone] (Option 1) ?[email or web address] ? ? ? ? ?----------------------------------------------------------------- ?THERAPIST ONLY  (No Psychiatry) ? ?[facility] ?[address] ?[place], [place] ?[address] [place] ?P: [phone] ? ?[facility], LCSW ?[phone] ?[place], [place] [address] ?[place]: [phone] ? ?[employer] [facility] [employer]   ?Address: [address] [place], [place], [place] [address] ?Hours: Open today ? 9AM-7PM ?Phone: [phone] ? ?[facility] ?Address: [address], [place], [place] [address] ?Phone: [phone] ? ? ? ?[facility] ? ?[facility] [place] ?[address], [place], [place] [address] ?Phone: [phone] ? ?[facility], available walk-in 9am-4pm M-F ?[address] ?[place], [place] [address] ?Hours: 9am - 4pm (M-F, walk in available) ?[phone] ? ?

## 2022-01-07 LAB — POCT URINALYSIS DIPSTICK
Bilirubin, UA: NEGATIVE
Blood, UA: NEGATIVE
Glucose, UA: NEGATIVE
Ketones, UA: NEGATIVE
Leukocytes, UA: NEGATIVE
Nitrite, UA: NEGATIVE
Protein, UA: NEGATIVE
Spec Grav, UA: <=1.005 — AB (ref 1.010–1.025)
Urobilinogen, UA: 0.2 E.U./dL
pH, UA: 6 (ref 5.0–8.0)

## 2022-01-07 LAB — POCT URINE PREGNANCY: Preg Test, Ur: NEGATIVE

## 2022-01-09 LAB — CBC WITH DIFFERENTIAL/PLATELET
Absolute Monocytes: 439 cells/uL (ref 200–950)
Basophils Absolute: 163 cells/uL (ref 0–200)
Basophils Relative: 1.9 %
Eosinophils Absolute: 103 cells/uL (ref 15–500)
Eosinophils Relative: 1.2 %
HCT: 39.5 % (ref 35.0–45.0)
Hemoglobin: 13 g/dL (ref 11.7–15.5)
Lymphs Abs: 2047 cells/uL (ref 850–3900)
MCH: 30.8 pg (ref 27.0–33.0)
MCHC: 32.9 g/dL (ref 32.0–36.0)
MCV: 93.6 fL (ref 80.0–100.0)
MPV: 9.9 fL (ref 7.5–12.5)
Monocytes Relative: 5.1 %
Neutro Abs: 5848 cells/uL (ref 1500–7800)
Neutrophils Relative %: 68 %
Platelets: 296 10*3/uL (ref 140–400)
RBC: 4.22 10*6/uL (ref 3.80–5.10)
RDW: 14.2 % (ref 11.0–15.0)
Total Lymphocyte: 23.8 %
WBC: 8.6 10*3/uL (ref 3.8–10.8)

## 2022-01-09 LAB — COMPREHENSIVE METABOLIC PANEL
AG Ratio: 1.3 (calc) (ref 1.0–2.5)
ALT: 167 U/L — ABNORMAL HIGH (ref 6–29)
AST: 151 U/L — ABNORMAL HIGH (ref 10–30)
Albumin: 4.1 g/dL (ref 3.6–5.1)
Alkaline phosphatase (APISO): 162 U/L — ABNORMAL HIGH (ref 31–125)
BUN/Creatinine Ratio: 8 (calc) (ref 6–22)
BUN: 6 mg/dL — ABNORMAL LOW (ref 7–25)
CO2: 26 mmol/L (ref 20–32)
Calcium: 9.7 mg/dL (ref 8.6–10.2)
Chloride: 96 mmol/L — ABNORMAL LOW (ref 98–110)
Creat: 0.78 mg/dL (ref 0.50–0.96)
Globulin: 3.2 g/dL (calc) (ref 1.9–3.7)
Glucose, Bld: 93 mg/dL (ref 65–139)
Potassium: 3.6 mmol/L (ref 3.5–5.3)
Sodium: 135 mmol/L (ref 135–146)
Total Bilirubin: 1.1 mg/dL (ref 0.2–1.2)
Total Protein: 7.3 g/dL (ref 6.1–8.1)

## 2022-01-09 LAB — B12 AND FOLATE PANEL
Folate: 15.6 ng/mL
Vitamin B-12: 847 pg/mL (ref 200–1100)

## 2022-01-09 LAB — VITAMIN B1: Vitamin B1 (Thiamine): 13 nmol/L (ref 8–30)

## 2022-01-13 ENCOUNTER — Encounter: Payer: Self-pay | Admitting: Internal Medicine

## 2022-01-13 ENCOUNTER — Ambulatory Visit (INDEPENDENT_AMBULATORY_CARE_PROVIDER_SITE_OTHER): Payer: BC Managed Care – PPO | Admitting: Internal Medicine

## 2022-01-13 VITALS — BP 130/78 | HR 78 | Temp 97.1°F | Wt 139.0 lb

## 2022-01-13 DIAGNOSIS — K7010 Alcoholic hepatitis without ascites: Secondary | ICD-10-CM

## 2022-01-13 DIAGNOSIS — M7552 Bursitis of left shoulder: Secondary | ICD-10-CM | POA: Diagnosis not present

## 2022-01-13 DIAGNOSIS — F17200 Nicotine dependence, unspecified, uncomplicated: Secondary | ICD-10-CM | POA: Diagnosis not present

## 2022-01-13 DIAGNOSIS — K852 Alcohol induced acute pancreatitis without necrosis or infection: Secondary | ICD-10-CM

## 2022-01-13 MED ORDER — NICOTINE POLACRILEX 4 MG MT LOZG: 4.0000 mg | LOZENGE | OROMUCOSAL | 0 refills | Status: DC | PRN

## 2022-01-13 MED ORDER — PREDNISONE 10 MG PO TABS: ORAL_TABLET | ORAL | 0 refills | Status: DC

## 2022-01-13 MED ORDER — NALTREXONE 380 MG IM SUSR: 380.0000 mg | INTRAMUSCULAR | 5 refills | Status: DC

## 2022-01-13 NOTE — Progress Notes (Signed)
? ?Subjective:  ? ? Patient ID: Sherri Harris, female    DOB: 1995/12/01, 26 y.o.   MRN: [id number] ? ?HPI ? ?Patient presents to clinic today for follow up of left shoulder pain.  This started after she was injected with an IO in her left shoulder by EMS 3/10 after she was found unresponsive after excessive alcohol intake.  She was seen 3/23 for the same by Talitha Givens, PA.  No imaging was obtained at that time.  She was started on Gabapentin and Ibuprofen, which she reports has not provided any relief. She describes the pain as sharp, stabbing and achy. The pain radiates down her arm and into her neck. The pain is worse with movement. She denies numbness, tingling or weakness to her left upper extremity. ? ?She would also like to get started back on Vivetrol injections. She reports this is the only thing that has helped to keep her alcoholism under control. She is currently going to [employer].  ? ?She would also like to follow up on her liver enzymes and lipase. Her lipase was initially 234, AST 668, ALT 291 during initial assessment at the ER. This has improved to 151/167 with an Alk Phos of 162 at recent follow up. RUQ ultrasound showed fatty liver but no concerns for cirrhosis at this time. ? ?She would also like to get something to help her quit smoking. ? ?Review of Systems ? ?   ?Past Medical History:  ?Diagnosis Date  ? Anxiety   ? Hypertension   ? ? ?Current Outpatient Medications  ?Medication Sig Dispense Refill  ? busPIRone (BUSPAR) 5 MG tablet Take 5 mg by mouth 3 (three) times daily.    ? gabapentin (NEURONTIN) 300 MG capsule Take 1 capsule (300 mg total) by mouth 3 (three) times daily. Gabapentin (take every day to reduce withdrawal symptoms at baseline) Day 1 - 300 mg every 6 hours, Day 2 - 300 mg every 8 hours, Day 3 - 300 mg every 12 hours, Day 4-7 - 300 mg one dose, After day 7 may take once per day at bedtime 90 capsule 0  ? Norethindrone-Ethinyl Estradiol-Fe Biphas (LO LOESTRIN FE) 1 MG-10  MCG / 10 MCG tablet TAKE 1 TABLET BY MOUTH DAILY. 84 tablet 0  ? QUEtiapine (SEROQUEL) 100 MG tablet Take 1 tablet (100 mg total) by mouth at bedtime. 30 tablet 0  ? sertraline (ZOLOFT) 50 MG tablet Take 1 tablet (50 mg total) by mouth daily. 30 tablet 1  ? thiamine 100 MG tablet Take 1 tablet (100 mg total) by mouth every morning. 30 tablet 0  ? ?No current facility-administered medications for this visit.  ? ? ?Allergies  ?Allergen Reactions  ? Amoxicillin Rash  ?  REACTION: rash  ? Penicillins Rash  ? ? ?Family History  ?Problem Relation Age of Onset  ? Hypertension Mother   ? Thyroid disease Mother   ? Hypertension Maternal Aunt   ? Hypertension Maternal Uncle   ? Alcohol abuse Maternal Uncle   ? Heart disease Maternal Grandmother   ? Cancer Maternal Grandmother   ?     Breast  ? Hypertension Father   ? Cardiomyopathy Father   ? Alcohol abuse Maternal Grandfather   ? Alcohol abuse Paternal Uncle   ? ? ?Social History  ? ?Socioeconomic History  ? Marital status: Single  ?  Spouse name: Not on file  ? Number of children: Not on file  ? Years of education: Not on file  ?  Highest education level: Not on file  ?Occupational History  ? Not on file  ?Tobacco Use  ? Smoking status: Never  ? Smokeless tobacco: Never  ?Vaping Use  ? Vaping Use: Never used  ?Substance and Sexual Activity  ? Alcohol use: Yes  ?  Comment: bottle of wine/day   ? Drug use: No  ? Sexual activity: Yes  ?  Birth control/protection: Pill  ?Other Topics Concern  ? Not on file  ?Social History Narrative  ? Not on file  ? ?Social Determinants of Health  ? ?Financial Resource Strain: Not on file  ?Food Insecurity: Not on file  ?Transportation Needs: Not on file  ?Physical Activity: Not on file  ?Stress: Not on file  ?Social Connections: Not on file  ?Intimate Partner Violence: Not on file  ? ? ? ?Constitutional: Denies fever, malaise, fatigue, headache or abrupt weight changes.  ?Respiratory: Denies difficulty breathing, shortness of breath, cough or  sputum production.   ?Cardiovascular: Denies chest pain, chest tightness, palpitations or swelling in the hands or feet.  ?Gastrointestinal: Denies abdominal pain, bloating, constipation, diarrhea or blood in the stool.  ?GU: Denies urgency, frequency, pain with urination, burning sensation, blood in urine, odor or discharge. ?Musculoskeletal: Patient reports left shoulder pain.  Denies decrease in range of motion, difficulty with gait, muscle pain or joint swelling.  ?Skin: Denies redness, rashes, lesions or ulcercations.  ?Neurological: Denies numbness, tingling, weakness, or problems with balance and coordination.  ?Psych: Pt reports anxiety. Denies depression, SI/HI. ? ?No other specific complaints in a complete review of systems (except as listed in HPI above). ? ?Objective:  ? Physical Exam ? ?BP 130/78   Pulse 78   Temp (!) 97.1 ?F (36.2 ?C) (Temporal)   Wt 139 lb (63 kg)   SpO2 97%   BMI 19.94 kg/m?  ? ?Wt Readings from Last 3 Encounters:  ?01/06/22 134 lb (60.8 kg)  ?02/13/21 150 lb (68 kg)  ?11/16/20 140 lb (63.5 kg)  ? ? ?General: Appears her stated age, well developed, well nourished in NAD. ?Skin: Warm, dry and intact. Bruising noted in the subacromial area, left shoulder. ?Cardiovascular: Normal rate and rhythm.  ?Pulmonary/Chest: Normal effort and positive vesicular breath sounds.  ?Abdomen: Normal bowel sounds.  ?Musculoskeletal: Decreased external rotation of the left shoulder. Normal internal rotation of the left shoulder. Pain with palpation over the left subacromial bursa. Negative drop can test on the left. Strength 5/5 BUE. Shoulder shrugs equal. Hand grips equal.  ?Neurological: Alert and oriented. Coordination normal.  ?Psychiatric: Mood and affect normal. Behavior is normal. Judgment and thought content normal.  ? ? ?BMET ?   ?Component Value Date/Time  ? NA 135 01/06/2022 1630  ? NA 138 01/22/2018 1012  ? K 3.6 01/06/2022 1630  ? CL 96 (L) 01/06/2022 1630  ? CO2 26 01/06/2022 1630  ?  GLUCOSE 93 01/06/2022 1630  ? BUN 6 (L) 01/06/2022 1630  ? BUN 9 01/22/2018 1012  ? CREATININE 0.78 01/06/2022 1630  ? CALCIUM 9.7 01/06/2022 1630  ? GFRNONAA >60 02/13/2021 1613  ? GFRAA >60 04/20/2020 1216  ? ? ?Lipid Panel  ?   ?Component Value Date/Time  ? CHOL 185 11/16/2020 1610  ? TRIG 164 (H) 11/16/2020 1610  ? HDL 70 11/16/2020 1610  ? CHOLHDL 2.6 11/16/2020 1610  ? VLDL 32.0 07/18/2019 1535  ? [place] 89 11/16/2020 1610  ? ? ?CBC ?   ?Component Value Date/Time  ? WBC 8.6 01/06/2022 1630  ? RBC  4.22 01/06/2022 1630  ? HGB 13.0 01/06/2022 1630  ? HGB 14.5 01/22/2018 1012  ? HCT 39.5 01/06/2022 1630  ? HCT 42.2 01/22/2018 1012  ? PLT 296 01/06/2022 1630  ? PLT 337 01/22/2018 1012  ? MCV 93.6 01/06/2022 1630  ? MCV 90 01/22/2018 1012  ? MCH 30.8 01/06/2022 1630  ? MCHC 32.9 01/06/2022 1630  ? RDW 14.2 01/06/2022 1630  ? RDW 12.4 01/22/2018 1012  ? LYMPHSABS 2,047 01/06/2022 1630  ? MONOABS 0.3 04/20/2020 1216  ? EOSABS 103 01/06/2022 1630  ? BASOSABS 163 01/06/2022 1630  ? ? ?Hgb A1C ?No results found for: HGBA1C ? ? ? ? ? ?   ?Assessment & Plan:  ? ?Subacromial Bursitis, Left Shoulder: ? ?I do not think xray would be of benefit at this time ?Encouraged ice for 10 minutes 2 x day ?RX for Pred taper x 9 days ?Encouraged her to get a sling for immobilization x 1 week ? ?Smoker: ? ?She would like to try lozenges, RX sent to pharmacy ? ?Alcoholic Hepatitis and Pancreatitis: ? ?Will repeat CMET, lipase and check acute Hep Panel in 2 weeks ?RX for Vivetrol injections 380 mg monthly ?Encouraged her to continue going to [place] meetings ? ?RTC in 2 weeks for your annual exam ? ?Webb Silversmith, NP ? ?

## 2022-01-14 ENCOUNTER — Encounter: Payer: Self-pay | Admitting: Internal Medicine

## 2022-01-14 NOTE — Patient Instructions (Signed)
Managing the Challenge of Quitting Smoking ?Quitting smoking is a physical and mental challenge. You will face cravings, withdrawal symptoms, and temptation. Before quitting, work with your health care provider to make a plan that can help you manage quitting. Preparation can help you quit and keep you from giving in. ?How to manage lifestyle changes ?Managing stress ?Stress can make you want to smoke, and wanting to smoke may cause stress. It is important to find ways to manage your stress. You might try some of the following: ?Practice relaxation techniques. ?Breathe slowly and deeply, in through your nose and out through your mouth. ?Listen to music. ?Soak in a bath or take a shower. ?Imagine a peaceful place or vacation. ?Get some support. ?Talk with family or friends about your stress. ?Join a support group. ?Talk with a counselor or therapist. ?Get some physical activity. ?Go for a walk, run, or bike ride. ?Play a favorite sport. ?Practice yoga. ? ?Medicines ?Talk with your health care provider about medicines that might help you deal with cravings and make quitting easier for you. ?Relationships ?Social situations can be difficult when you are quitting smoking. To manage this, you can: ?Avoid parties and other social situations where people might be smoking. ?Avoid alcohol. ?Leave right away if you have the urge to smoke. ?Explain to your family and friends that you are quitting smoking. Ask for support and let them know you might be a bit grumpy. ?Plan activities where smoking is not an option. ?General instructions ?Be aware that many people gain weight after they quit smoking. However, not everyone does. To keep from gaining weight, have a plan in place before you quit and stick to the plan after you quit. Your plan should include: ?Having healthy snacks. When you have a craving, it may help to: ?Eat popcorn, carrots, celery, or other cut vegetables. ?Chew sugar-free gum. ?Changing how you eat. ?Eat small  portion sizes at meals. ?Eat 4-6 small meals throughout the day instead of 1-2 large meals a day. ?Be mindful when you eat. Do not watch television or do other things that might distract you as you eat. ?Exercising regularly. ?Make time to exercise each day. If you do not have time for a long workout, do short bouts of exercise for 5-10 minutes several times a day. ?Do some form of strengthening exercise, such as weight lifting. ?Do some exercise that gets your heart beating and causes you to breathe deeply, such as walking fast, running, swimming, or biking. This is very important. ?Drinking plenty of water or other low-calorie or no-calorie drinks. Drink 6-8 glasses of water daily. ? ?How to recognize withdrawal symptoms ?Your body and mind may experience discomfort as you try to get used to not having nicotine in your system. These effects are called withdrawal symptoms. They may include: ?Feeling hungrier than normal. ?Having trouble concentrating. ?Feeling irritable or restless. ?Having trouble sleeping. ?Feeling depressed. ?Craving a cigarette. ?To manage withdrawal symptoms: ?Avoid places, people, and activities that trigger your cravings. ?Remember why you want to quit. ?Get plenty of sleep. ?Avoid coffee and other caffeinated drinks. These may worsen some of your symptoms. ?These symptoms may surprise you. But be assured that they are normal to have when quitting smoking. ?How to manage cravings ?Come up with a plan for how to deal with your cravings. The plan should include the following: ?A definition of the specific situation you want to deal with. ?An alternative action you will take. ?A clear idea for how this action   will help. ?The name of someone who might help you with this. ?Cravings usually last for 5-10 minutes. Consider taking the following actions to help you with your plan to deal with cravings: ?Keep your mouth busy. ?Chew sugar-free gum. ?Suck on hard candies or a straw. ?Brush your  teeth. ?Keep your hands and body busy. ?Change to a different activity right away. ?Squeeze or play with a ball. ?Do an activity or a hobby, such as making bead jewelry, practicing needlepoint, or working with wood. ?Mix up your normal routine. ?Take a short exercise break. Go for a quick walk or run up and down stairs. ?Focus on doing something kind or helpful for someone else. ?Call a friend or family member to talk during a craving. ?Join a support group. ?Contact a quitline. ?Where to find support ?To get help or find a support group: ?Call the National Cancer Institute's Smoking Quitline: 1-800-QUIT NOW (784-8669) ?Visit the website of the Substance Abuse and Mental Health Services Administration: www.samhsa.gov ?Text QUIT to SmokefreeTXT: 478848 ?Where to find more information ?Visit these websites to find more information on quitting smoking: ?National Cancer Institute: www.smokefree.gov ?American Lung Association: www.lung.org ?American Cancer Society: www.cancer.org ?Centers for Disease Control and Prevention: www.cdc.gov ?American Heart Association: www.heart.org ?Contact a health care provider if: ?You want to change your plan for quitting. ?The medicines you are taking are not helping. ?Your eating feels out of control or you cannot sleep. ?Get help right away if: ?You feel depressed or become very anxious. ?Summary ?Quitting smoking is a physical and mental challenge. You will face cravings, withdrawal symptoms, and temptation to smoke again. Preparation can help you as you go through these challenges. ?Try different techniques to manage stress, handle social situations, and prevent weight gain. ?You can deal with cravings by keeping your mouth busy (such as by chewing gum), keeping your hands and body busy, calling family or friends, or contacting a quitline for people who want to quit smoking. ?You can deal with withdrawal symptoms by avoiding places where people smoke, getting plenty of rest, and  avoiding drinks with caffeine. ?This information is not intended to replace advice given to you by your health care provider. Make sure you discuss any questions you have with your health care provider. ?Document Revised: 06/07/2021 Document Reviewed: 07/19/2019 ?Elsevier Patient Education ? 2022 Elsevier Inc. ? ?

## 2022-01-16 ENCOUNTER — Encounter: Payer: Self-pay | Admitting: Internal Medicine

## 2022-01-16 MED ORDER — NICOTINE 14 MG/24HR TD PT24: 14.0000 mg | MEDICATED_PATCH | Freq: Every day | TRANSDERMAL | 0 refills | Status: DC

## 2022-01-21 DIAGNOSIS — F1020 Alcohol dependence, uncomplicated: Secondary | ICD-10-CM | POA: Diagnosis not present

## 2022-01-23 ENCOUNTER — Ambulatory Visit: Payer: BC Managed Care – PPO | Admitting: Internal Medicine

## 2022-01-24 ENCOUNTER — Telehealth (HOSPITAL_COMMUNITY): Payer: Self-pay | Admitting: Licensed Clinical Social Worker

## 2022-01-24 NOTE — Telephone Encounter (Signed)
The therapist attempts to reach [facility] in relation to a referral from Ms. Denny Peon Mecum, PA-C. The woman answering the phone identifies herself as being Sherri Harris's mother and says that this is not her phone but asks to take a message for her. ? ?The therapist leaves a HIPAA-compliant message with her mother with the therapist's callback number which is [phone]. ? ?Myrna Blazer, MA, LCSW, [facility], LCAS ?01/24/2022 ?

## 2022-01-27 ENCOUNTER — Telehealth: Payer: Self-pay | Admitting: Internal Medicine

## 2022-01-27 NOTE — Telephone Encounter (Signed)
Copied from [place] [id number]. Topic: General - Other ?>> Jan 27, 2022 10:30 AM Leward Quan A wrote: ?Reason for CRM: Andee Poles from [facility] need a call back to schedule a delivery for this patient [facility] call [facility] at Ph# [phone] ?

## 2022-01-28 ENCOUNTER — Ambulatory Visit (INDEPENDENT_AMBULATORY_CARE_PROVIDER_SITE_OTHER): Payer: BC Managed Care – PPO | Admitting: Internal Medicine

## 2022-01-28 ENCOUNTER — Encounter: Payer: Self-pay | Admitting: Internal Medicine

## 2022-01-28 VITALS — BP 132/88 | HR 85 | Temp 97.1°F | Ht 69.5 in | Wt 137.0 lb

## 2022-01-28 DIAGNOSIS — E781 Pure hyperglyceridemia: Secondary | ICD-10-CM

## 2022-01-28 NOTE — Telephone Encounter (Signed)
Confirmed delivery date for 01/29/2022 in the morning.  ? ?Thanks,  ? ?-Mickel Baas  ?

## 2022-01-28 NOTE — Patient Instructions (Signed)

## 2022-01-28 NOTE — Progress Notes (Signed)
? ?Subjective:  ? ? Patient ID: Sherri Harris, female    DOB: Jun 13, 1996, 25 y.o.   MRN: [id number] ? ?HPI ? ?Pt presents to the clinic today for her annual exam. ? ?Flu: 11/2020 ?Tetanus: 07/2019 ?Covid: Pfizer x 2 ?Pap smear: 07/2019 ?Dentist: biannually ? ?Diet: She does not eat meat. She consumes fruits and veggies. She tries to avoid fried foods. She drinks mostly water, coffee ?Exercise: Walking ? ?Review of Systems ? ?   ?Past Medical History:  ?Diagnosis Date  ? Anxiety   ? Hypertension   ? ? ?Current Outpatient Medications  ?Medication Sig Dispense Refill  ? busPIRone (BUSPAR) 5 MG tablet Take 5 mg by mouth 3 (three) times daily.    ? Naltrexone 380 MG SUSR Inject 380 mg into the muscle every 30 (thirty) days. 1.2 each 5  ? nicotine (NICODERM CQ - DOSED IN MG/24 HOURS) 14 mg/24hr patch Place 1 patch (14 mg total) onto the skin daily. 28 patch 0  ? nicotine polacrilex (COMMIT) 4 MG lozenge Take 1 lozenge (4 mg total) by mouth as needed for smoking cessation. 100 tablet 0  ? Norethindrone-Ethinyl Estradiol-Fe Biphas (LO LOESTRIN FE) 1 MG-10 MCG / 10 MCG tablet TAKE 1 TABLET BY MOUTH DAILY. 84 tablet 0  ? predniSONE (DELTASONE) 10 MG tablet Take 3 tabs on days 1-3, 2 tabs on days 4-6, 1 tab on days 7-9 18 tablet 0  ? QUEtiapine (SEROQUEL) 100 MG tablet Take 1 tablet (100 mg total) by mouth at bedtime. 30 tablet 0  ? sertraline (ZOLOFT) 50 MG tablet Take 1 tablet (50 mg total) by mouth daily. 30 tablet 1  ? thiamine 100 MG tablet Take 1 tablet (100 mg total) by mouth every morning. 30 tablet 0  ? ?No current facility-administered medications for this visit.  ? ? ?Allergies  ?Allergen Reactions  ? Amoxicillin Rash  ?  REACTION: rash  ? Penicillins Rash  ? ? ?Family History  ?Problem Relation Age of Onset  ? Hypertension Mother   ? Thyroid disease Mother   ? Hypertension Maternal Aunt   ? Hypertension Maternal Uncle   ? Alcohol abuse Maternal Uncle   ? Heart disease Maternal Grandmother   ? Cancer Maternal  Grandmother   ?     Breast  ? Hypertension Father   ? Cardiomyopathy Father   ? Alcohol abuse Maternal Grandfather   ? Alcohol abuse Paternal Uncle   ? ? ?Social History  ? ?Socioeconomic History  ? Marital status: Single  ?  Spouse name: Not on file  ? Number of children: Not on file  ? Years of education: Not on file  ? Highest education level: Not on file  ?Occupational History  ? Not on file  ?Tobacco Use  ? Smoking status: Never  ? Smokeless tobacco: Never  ?Vaping Use  ? Vaping Use: Never used  ?Substance and Sexual Activity  ? Alcohol use: Yes  ?  Comment: bottle of wine/day   ? Drug use: No  ? Sexual activity: Yes  ?  Birth control/protection: Pill  ?Other Topics Concern  ? Not on file  ?Social History Narrative  ? Not on file  ? ?Social Determinants of Health  ? ?Financial Resource Strain: Not on file  ?Food Insecurity: Not on file  ?Transportation Needs: Not on file  ?Physical Activity: Not on file  ?Stress: Not on file  ?Social Connections: Not on file  ?Intimate Partner Violence: Not on file  ? ? ? ?Constitutional:  Denies fever, malaise, fatigue, headache or abrupt weight changes.  ?HEENT: Denies eye pain, eye redness, ear pain, ringing in the ears, wax buildup, runny nose, nasal congestion, bloody nose, or sore throat. ?Respiratory: Denies difficulty breathing, shortness of breath, cough or sputum production.   ?Cardiovascular: Denies chest pain, chest tightness, palpitations or swelling in the hands or feet.  ?Gastrointestinal: Denies abdominal pain, bloating, constipation, diarrhea or blood in the stool.  ?GU: Denies urgency, frequency, pain with urination, burning sensation, blood in urine, odor or discharge. ?Musculoskeletal: Denies decrease in range of motion, difficulty with gait, muscle pain or joint pain and swelling.  ?Skin: Denies redness, rashes, lesions or ulcercations.  ?Neurological: Pt reports jitteriness, insomnia. Denies dizziness, difficulty with memory, difficulty with speech or  problems with balance and coordination.  ?Psych: Pt has a history of anxiety and depression. Denies SI/HI. ? ?No other specific complaints in a complete review of systems (except as listed in HPI above). ? ?Objective:  ? Physical Exam ? ?BP 132/88 (BP Location: Right Arm, Patient Position: Sitting, Cuff Size: Normal)   Pulse 85   Temp (!) 97.1 ?F (36.2 ?C) (Temporal)   Ht 5' 9.5" (1.765 m)   Wt 137 lb (62.1 kg)   SpO2 100%   BMI 19.94 kg/m?  ? ?Wt Readings from Last 3 Encounters:  ?01/13/22 139 lb (63 kg)  ?01/06/22 134 lb (60.8 kg)  ?02/13/21 150 lb (68 kg)  ? ? ?General: Appears her stated age, well developed, well nourished in NAD. ?Skin: Warm, dry and intact.  ?HEENT: Head: normal shape and size; Eyes: sclera white and EOMs intact;  ?Neck:  Neck supple, trachea midline. No masses, lumps or thyromegaly present.  ?Cardiovascular: Normal rate and rhythm. S1,S2 noted.  No murmur, rubs or gallops noted. No JVD or BLE edema.  ?Pulmonary/Chest: Normal effort and positive vesicular breath sounds. No respiratory distress. No wheezes, rales or ronchi noted.  ?Abdomen: Soft and nontender. Normal bowel sounds.  ?Musculoskeletal: Strength 5/5 BUE/BLE. No difficulty with gait.  ?Neurological: Alert and oriented. Cranial nerves II-XII grossly intact. Coordination normal.  ?Psychiatric: Mood and affect normal. Behavior is normal. Judgment and thought content normal.  ? ? ?BMET ?   ?Component Value Date/Time  ? NA 135 01/06/2022 1630  ? NA 138 01/22/2018 1012  ? K 3.6 01/06/2022 1630  ? CL 96 (L) 01/06/2022 1630  ? CO2 26 01/06/2022 1630  ? GLUCOSE 93 01/06/2022 1630  ? BUN 6 (L) 01/06/2022 1630  ? BUN 9 01/22/2018 1012  ? CREATININE 0.78 01/06/2022 1630  ? CALCIUM 9.7 01/06/2022 1630  ? GFRNONAA >60 02/13/2021 1613  ? GFRAA >60 04/20/2020 1216  ? ? ?Lipid Panel  ?   ?Component Value Date/Time  ? CHOL 185 11/16/2020 1610  ? TRIG 164 (H) 11/16/2020 1610  ? HDL 70 11/16/2020 1610  ? CHOLHDL 2.6 11/16/2020 1610  ? VLDL 32.0  07/18/2019 1535  ? LDLCALC 89 11/16/2020 1610  ? ? ?CBC ?   ?Component Value Date/Time  ? WBC 8.6 01/06/2022 1630  ? RBC 4.22 01/06/2022 1630  ? HGB 13.0 01/06/2022 1630  ? HGB 14.5 01/22/2018 1012  ? HCT 39.5 01/06/2022 1630  ? HCT 42.2 01/22/2018 1012  ? PLT 296 01/06/2022 1630  ? PLT 337 01/22/2018 1012  ? MCV 93.6 01/06/2022 1630  ? MCV 90 01/22/2018 1012  ? MCH 30.8 01/06/2022 1630  ? MCHC 32.9 01/06/2022 1630  ? RDW 14.2 01/06/2022 1630  ? RDW 12.4 01/22/2018 1012  ?  LYMPHSABS 2,047 01/06/2022 1630  ? MONOABS 0.3 04/20/2020 1216  ? EOSABS 103 01/06/2022 1630  ? BASOSABS 163 01/06/2022 1630  ? ? ?Hgb A1C ?No results found for: HGBA1C ? ? ? ? ? ? ?   ?Assessment & Plan:  ? ?Preventative Health Maintenance: ? ?Encouraged her to get a flu shot in the fall ?Tetanus UTD ?Encouraged her to get her COVID-vaccine ?Pap smear due 07/2022 ?Encouraged her to consume a balanced diet and exercise regimen ?Advised her to see a dentist annually ?We will check CMET, lipid and A1c today ? ?RTC in 6 months, follow-up chronic conditions ?Sherri Reaperegina Eugene Isadore, NP ? ? ?

## 2022-01-29 LAB — COMPLETE METABOLIC PANEL WITH GFR
AG Ratio: 1.7 (calc) (ref 1.0–2.5)
ALT: 22 U/L (ref 6–29)
AST: 19 U/L (ref 10–30)
Albumin: 4.4 g/dL (ref 3.6–5.1)
Alkaline phosphatase (APISO): 60 U/L (ref 31–125)
BUN: 7 mg/dL (ref 7–25)
CO2: 23 mmol/L (ref 20–32)
Calcium: 9.7 mg/dL (ref 8.6–10.2)
Chloride: 104 mmol/L (ref 98–110)
Creat: 0.75 mg/dL (ref 0.50–0.96)
Globulin: 2.6 g/dL (calc) (ref 1.9–3.7)
Glucose, Bld: 72 mg/dL (ref 65–139)
Potassium: 4.3 mmol/L (ref 3.5–5.3)
Sodium: 138 mmol/L (ref 135–146)
Total Bilirubin: 0.6 mg/dL (ref 0.2–1.2)
Total Protein: 7 g/dL (ref 6.1–8.1)
eGFR: 112 mL/min/{1.73_m2} (ref >=60)

## 2022-01-29 LAB — LIPID PANEL
Cholesterol: 189 mg/dL (ref <200)
HDL: 81 mg/dL (ref >=50)
LDL Cholesterol (Calc): 87 mg/dL (calc)
Non-HDL Cholesterol (Calc): 108 mg/dL (calc) (ref <130)
Total CHOL/HDL Ratio: 2.3 (calc) (ref <5.0)
Triglycerides: 118 mg/dL (ref <150)

## 2022-01-29 LAB — HEMOGLOBIN A1C
Hgb A1c MFr Bld: 4.8 % of total Hgb (ref <5.7)
Mean Plasma Glucose: 91 mg/dL
eAG (mmol/L): 5 mmol/L

## 2022-01-29 NOTE — Telephone Encounter (Signed)
No, has to be given by clinical staff ?

## 2022-01-29 NOTE — Telephone Encounter (Signed)
Advised pt's mom Lattie Haw (On DPR) that the RX has bee delivered.   ? ?Is it okay for her to give it to herself at home?  Pt just started a new job and will have a hard time getting time off.  ? ? ?Thanks,  ? ?-Mickel Baas  ?

## 2022-01-29 NOTE — Telephone Encounter (Signed)
Pts mother called in to confirm the pts Rx was delivered to the office today 4/19, caller wanted to know if the pt could administer the Rx at home, please advise.  ?

## 2022-01-30 ENCOUNTER — Ambulatory Visit: Payer: BC Managed Care – PPO

## 2022-01-30 NOTE — Telephone Encounter (Signed)
Apt 01/31/2022 at 12:15 ? ?Thanks,  ? ?-Vernona Rieger  ?

## 2022-01-31 ENCOUNTER — Ambulatory Visit (INDEPENDENT_AMBULATORY_CARE_PROVIDER_SITE_OTHER): Payer: BC Managed Care – PPO

## 2022-01-31 DIAGNOSIS — F1094 Alcohol use, unspecified with alcohol-induced mood disorder: Secondary | ICD-10-CM

## 2022-01-31 MED ORDER — NALTREXONE 380 MG IM SUSR
380.0000 mg | Freq: Once | INTRAMUSCULAR | Status: AC
  Administered 2022-01-31: 380 mg via INTRAMUSCULAR

## 2022-02-07 ENCOUNTER — Other Ambulatory Visit: Payer: Self-pay

## 2022-02-07 DIAGNOSIS — F419 Anxiety disorder, unspecified: Secondary | ICD-10-CM

## 2022-02-08 MED ORDER — QUETIAPINE FUMARATE 100 MG PO TABS: 100.0000 mg | ORAL_TABLET | Freq: Every day | ORAL | 0 refills | Status: DC

## 2022-02-24 DIAGNOSIS — F1020 Alcohol dependence, uncomplicated: Secondary | ICD-10-CM | POA: Diagnosis not present

## 2022-02-26 ENCOUNTER — Encounter: Payer: Self-pay | Admitting: Internal Medicine

## 2022-03-05 ENCOUNTER — Ambulatory Visit (INDEPENDENT_AMBULATORY_CARE_PROVIDER_SITE_OTHER): Payer: BC Managed Care – PPO

## 2022-03-05 DIAGNOSIS — F1094 Alcohol use, unspecified with alcohol-induced mood disorder: Secondary | ICD-10-CM | POA: Diagnosis not present

## 2022-03-05 MED ORDER — NALTREXONE 380 MG IM SUSR
380.0000 mg | INTRAMUSCULAR | Status: DC
  Administered 2022-03-05: 380 mg via INTRAMUSCULAR

## 2022-03-16 ENCOUNTER — Encounter: Payer: Self-pay | Admitting: Internal Medicine

## 2022-03-27 DIAGNOSIS — F1020 Alcohol dependence, uncomplicated: Secondary | ICD-10-CM | POA: Diagnosis not present

## 2022-04-02 ENCOUNTER — Ambulatory Visit (INDEPENDENT_AMBULATORY_CARE_PROVIDER_SITE_OTHER): Payer: BC Managed Care – PPO

## 2022-04-02 DIAGNOSIS — F1094 Alcohol use, unspecified with alcohol-induced mood disorder: Secondary | ICD-10-CM | POA: Diagnosis not present

## 2022-04-02 MED ORDER — NALTREXONE 380 MG IM SUSR
380.0000 mg | INTRAMUSCULAR | Status: DC
  Administered 2022-04-02: 380 mg via INTRAMUSCULAR

## 2022-04-25 ENCOUNTER — Telehealth: Payer: Self-pay

## 2022-04-25 DIAGNOSIS — F1020 Alcohol dependence, uncomplicated: Secondary | ICD-10-CM | POA: Diagnosis not present

## 2022-04-25 NOTE — Telephone Encounter (Signed)
Copied from CRM [id number]. Topic: General - Other >> Apr 25, 2022  4:15 PM Franchot Heidelberg wrote: Reason for CRM: [facility]ri [facility] to send injection Naltrexone SUSR 380 mg Tuesday July 18th for the patient, confirmed address. Pt is scheduled Wednesday   [phone]

## 2022-04-25 NOTE — Telephone Encounter (Signed)
[facility] and [facility]18  Thanks,   -Vernona Rieger

## 2022-04-30 ENCOUNTER — Ambulatory Visit: Payer: BC Managed Care – PPO

## 2022-05-02 MED ORDER — NALTREXONE 380 MG IM SUSR: 380.0000 mg | Freq: Once | INTRAMUSCULAR | Status: DC

## 2022-05-26 DIAGNOSIS — F1020 Alcohol dependence, uncomplicated: Secondary | ICD-10-CM | POA: Diagnosis not present

## 2022-05-28 ENCOUNTER — Telehealth: Payer: Self-pay

## 2022-05-28 NOTE — Telephone Encounter (Signed)
Copied from CRM [id number]. Topic: General - Other >> May 26, 2022  5:12 PM Macon Large wrote: Reason for CRM: Casimiro Needle with [facility] requests call back with the date of delivery for medications. Cb# [phone]  [facility] called and wanted to confirm before shipment an order of vivitrol injections, please call [phone] team member  I am not familiar with this patient so I just wanted to get the okay before I called them. Please advise.

## 2022-05-29 NOTE — Telephone Encounter (Signed)
Confirmed. It should arrive here at the office tomorrow.

## 2022-05-29 NOTE — Telephone Encounter (Signed)
Yes, she gets monthly Vivitrol.  Please call and confirm delivery date.

## 2022-06-04 ENCOUNTER — Ambulatory Visit (INDEPENDENT_AMBULATORY_CARE_PROVIDER_SITE_OTHER): Payer: BC Managed Care – PPO

## 2022-06-04 DIAGNOSIS — F1094 Alcohol use, unspecified with alcohol-induced mood disorder: Secondary | ICD-10-CM | POA: Diagnosis not present

## 2022-06-04 MED ORDER — NALTREXONE 380 MG IM SUSR
380.0000 mg | Freq: Once | INTRAMUSCULAR | Status: AC
  Administered 2022-06-04: 380 mg via INTRAMUSCULAR

## 2022-06-13 ENCOUNTER — Ambulatory Visit: Payer: Self-pay

## 2022-06-13 ENCOUNTER — Ambulatory Visit
Admission: EM | Admit: 2022-06-13 | Discharge: 2022-06-13 | Disposition: A | Discharge status: Home / Self Care | Payer: BC Managed Care – PPO | Attending: Family Medicine | Admitting: Family Medicine

## 2022-06-13 DIAGNOSIS — Z20822 Contact with and (suspected) exposure to covid-19: Secondary | ICD-10-CM | POA: Insufficient documentation

## 2022-06-13 DIAGNOSIS — B349 Viral infection, unspecified: Secondary | ICD-10-CM | POA: Diagnosis not present

## 2022-06-13 LAB — SARS CORONAVIRUS 2 BY RT PCR: SARS Coronavirus 2 by RT PCR: NEGATIVE

## 2022-06-13 MED ORDER — ONDANSETRON HCL 4 MG PO TABS: 4.0000 mg | ORAL_TABLET | Freq: Four times a day (QID) | ORAL | 0 refills | Status: DC

## 2022-06-13 NOTE — ED Triage Notes (Addendum)
Patient presents to UC for diarrhea, headache, chills, and nausea since yesterday night. She states she took ibuprofen and zofran today. Unsure if she has had a fever. Requesting covid and flu testing.

## 2022-06-13 NOTE — Telephone Encounter (Signed)
FYI... Pt is going to Urgent Care

## 2022-06-13 NOTE — Telephone Encounter (Signed)
noted 

## 2022-06-13 NOTE — Telephone Encounter (Signed)
Chief Complaint: Diarrhea, sweating, nausea, head congestion - thinks she may have COVID Symptoms: ibid Frequency: this morning Pertinent Negatives: Patient denies exposure to COVID/FLU Disposition: [] ED /[x] Urgent Care (no appt availability in office) / [] Appointment(In office/virtual)/ []  [facility]/ [] Home Care/ [] Refused Recommended Disposition /[] [facility]/ []  Follow-up with PCP Additional Notes: PT woke up feeling poorly. She had 4 bouts of diarrhea last night and 2 more this morning. PT felt hot took Advil, is does not have fever. PT suspected fever this morning. PT is nauseous, but has not vomited. Pt is able to drink fluids, but has not eaten. Pt feel this may be COVID or Flu, but is unsure of any exposure. PT has abdominal pain as well.  Summary: possible covid / nausea   Nausea, diarrhea, congestion, sweating / pt started feeling this way this morning / please advise / pt thinks she may have covid      Reason for Disposition  [1] MODERATE diarrhea (e.g., 4-6 times / day more than normal) AND [2] present > 48 hours (2 days)  Answer Assessment - Initial Assessment Questions 1. DIARRHEA SEVERITY: "How bad is the diarrhea?" "How many more stools have you had in the past 24 hours than normal?"    - NO DIARRHEA (SCALE 0)   - MILD (SCALE 1-3): Few loose or mushy BMs; increase of 1-3 stools over normal daily number of stools; mild increase in ostomy output.   -  MODERATE (SCALE 4-7): Increase of 4-6 stools daily over normal; moderate increase in ostomy output.   -  SEVERE (SCALE 8-10; OR "WORST POSSIBLE"): Increase of 7 or more stools daily over normal; moderate increase in ostomy output; incontinence.     2 today 4 more last night 2. ONSET: "When did the diarrhea begin?"      This morning 3. BM CONSISTENCY: "How loose or watery is the diarrhea?"      Watery 4. VOMITING: "Are you also vomiting?" If Yes, ask: "How many times in the past 24 hours?"       Nausea 5. ABDOMEN PAIN: "Are you having any abdomen pain?" If Yes, ask: "What does it feel like?" (e.g., crampy, dull, intermittent, constant)      Acid 6. ABDOMEN PAIN SEVERITY: If present, ask: "How bad is the pain?"  (e.g., Scale 1-10; mild, moderate, or severe)   - MILD (1-3): doesn't interfere with normal activities, abdomen soft and not tender to touch    - MODERATE (4-7): interferes with normal activities or awakens from sleep, abdomen tender to touch    - SEVERE (8-10): excruciating pain, doubled over, unable to do any normal activities       6/10 7. ORAL INTAKE: If vomiting, "Have you been able to drink liquids?" "How much liquids have you had in the past 24 hours?"     Drinking yes 8. HYDRATION: "Any signs of dehydration?" (e.g., dry mouth [not just dry lips], too weak to stand, dizziness, new weight loss) "When did you last urinate?"     dizziness 9. EXPOSURE: "Have you traveled to a foreign country recently?" "Have you been exposed to anyone with diarrhea?" "Could you have eaten any food that was spoiled?"     no 10. ANTIBIOTIC USE: "Are you taking antibiotics now or have you taken antibiotics in the past 2 months?"       no 11. OTHER SYMPTOMS: "Do you have any other symptoms?" (e.g., fever, blood in stool)       Dizziness,  Congestion 12. PREGNANCY: "Is there any chance you are pregnant?" "When was your last menstrual period?"       no  Protocols used: [facility]

## 2022-06-13 NOTE — Discharge Instructions (Signed)
Recommend taking over-the-counter Imodium A-D for management of diarrhea symptoms.  I have refilled her Zofran to take as needed for nausea.  Force fluids to limit dehydration.  Resume normal diet as tolerated.

## 2022-06-13 NOTE — ED Provider Notes (Signed)
Sherri Harris    CSN: [id number] Arrival date & time: 06/13/22  6073      History   Chief Complaint Chief Complaint  Patient presents with  . Diarrhea  . Nausea  . Headache  . Chills    HPI Sherri Harris is a 26 y.o. female.   HPI Patient presents today with one day history of diarrhea, headache, nausea, and chills. She is uncertain if she had fever. Denies any URI symptoms. Requesting both COVID and Flu testing. She as taken Zofran and Ibuprofen for symptom management today. Denies any known sick contacts. Past Medical History:  Diagnosis Date  . Anxiety   . Hypertension     Patient Active Problem List   Diagnosis Date Noted  . Alcohol abuse 02/22/2020  . 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 Insomnia 05/30/2014    Past Surgical History:  Procedure Laterality Date  . TYMPANOSTOMY TUBE PLACEMENT    . WISDOM TOOTH EXTRACTION      OB History   No obstetric history on file.      Home Medications    Prior to Admission medications   Medication Sig Start Date End Date Taking? Authorizing Provider  busPIRone (BUSPAR) 5 MG tablet Take 5 mg by mouth 3 (three) times daily. 12/26/21   [provider]  Naltrexone 380 MG SUSR Inject 380 mg into the muscle every 30 (thirty) days. Patient not taking: Reported on 01/28/2022 01/13/22   Lorre Munroe, NP  Norethindrone-Ethinyl Estradiol-Fe Biphas (LO LOESTRIN FE) 1 MG-10 MCG / 10 MCG tablet TAKE 1 TABLET BY MOUTH DAILY. 01/06/22 01/06/23  Mecum, Erin E, PA-C  QUEtiapine (SEROQUEL) 100 MG tablet Take 1 tablet (100 mg total) by mouth at bedtime. 02/08/22   Lorre Munroe, NP  sertraline (ZOLOFT) 50 MG tablet Take 1 tablet (50 mg total) by mouth daily. 01/06/22   Mecum, Erin E, PA-C  thiamine 100 MG tablet Take 1 tablet (100 mg total) by mouth every morning. 01/06/22   Mecum, Oswaldo Conroy, PA-C    Family History Family History  Problem Relation Age of Onset  . Hypertension Mother   .  Thyroid disease Mother   . Hypertension Maternal Aunt   . Hypertension Maternal Uncle   . Alcohol abuse Maternal Uncle   . Heart disease Maternal Grandmother   . Cancer Maternal Grandmother        Breast  . Hypertension Father   . Cardiomyopathy Father   . Alcohol abuse Maternal Grandfather   . Alcohol abuse Paternal Uncle     Social History Social History   Tobacco Use  . Smoking status: Never  . Smokeless tobacco: Never  Vaping Use  . Vaping Use: Never used  Substance Use Topics  . Alcohol use: Yes    Comment: bottle of wine/day   . Drug use: No     Allergies   Amoxicillin and Penicillins   Review of Systems Review of Systems   Physical Exam Triage Vital Signs ED Triage Vitals  Enc Vitals Group     BP 06/13/22 1024 121/81     Pulse Rate 06/13/22 1024 71     Resp 06/13/22 1024 16     Temp 06/13/22 1024 97.7 F (36.5 C)     Temp Source 06/13/22 1024 Temporal     SpO2 06/13/22 1024 98 %     Weight --      Height --      Head Circumference --  Peak Flow --      Pain Score 06/13/22 1022 0     Pain Loc --      Pain Edu? --      Excl. in GC? --    No data found.  Updated Vital Signs BP 121/81 (BP Location: Left Arm)   Pulse 71   Temp 97.7 F (36.5 C) (Temporal)   Resp 16   SpO2 98%   Visual Acuity Right Eye Distance:   Left Eye Distance:   Bilateral Distance:    Right Eye Near:   Left Eye Near:    Bilateral Near:     Physical Exam   UC Treatments / Results  Labs (all labs ordered are listed, but only abnormal results are displayed) Labs Reviewed  POCT INFLUENZA A/B    EKG   Radiology No results found.  Procedures Procedures (including critical care time)  Medications Ordered in UC Medications - No data to display  Initial Impression / Assessment and Plan / UC Course  I have reviewed the triage vital signs and the nursing notes.  Pertinent labs & imaging results that were available during my care of the patient were  reviewed by me and considered in my medical decision making (see chart for details).     *** Final Clinical Impressions(s) / UC Diagnoses   Final diagnoses:  None   Discharge Instructions   None    ED Prescriptions   None    PDMP not reviewed this encounter.

## 2022-06-20 DIAGNOSIS — F10920 Alcohol use, unspecified with intoxication, uncomplicated: Secondary | ICD-10-CM | POA: Diagnosis not present

## 2022-06-20 DIAGNOSIS — R4689 Other symptoms and signs involving appearance and behavior: Secondary | ICD-10-CM | POA: Diagnosis not present

## 2022-06-21 DIAGNOSIS — F10920 Alcohol use, unspecified with intoxication, uncomplicated: Secondary | ICD-10-CM | POA: Diagnosis not present

## 2022-06-22 DIAGNOSIS — T50901A Poisoning by unspecified drugs, medicaments and biological substances, accidental (unintentional), initial encounter: Secondary | ICD-10-CM | POA: Diagnosis not present

## 2022-06-26 ENCOUNTER — Inpatient Hospital Stay (HOSPITAL_COMMUNITY)
Admission: EM | Admit: 2022-06-26 | Discharge: 2022-06-28 | DRG: 641 | Discharge status: Against Medical Advice | Payer: BC Managed Care – PPO | Attending: Family Medicine | Admitting: Family Medicine

## 2022-06-26 ENCOUNTER — Encounter (HOSPITAL_COMMUNITY): Payer: Self-pay | Admitting: *Deleted

## 2022-06-26 ENCOUNTER — Emergency Department (HOSPITAL_COMMUNITY): Payer: BC Managed Care – PPO

## 2022-06-26 ENCOUNTER — Other Ambulatory Visit: Payer: Self-pay

## 2022-06-26 DIAGNOSIS — E860 Dehydration: Secondary | ICD-10-CM | POA: Diagnosis not present

## 2022-06-26 DIAGNOSIS — Z880 Allergy status to penicillin: Secondary | ICD-10-CM

## 2022-06-26 DIAGNOSIS — I10 Essential (primary) hypertension: Secondary | ICD-10-CM | POA: Diagnosis not present

## 2022-06-26 DIAGNOSIS — F10239 Alcohol dependence with withdrawal, unspecified: Secondary | ICD-10-CM | POA: Diagnosis not present

## 2022-06-26 DIAGNOSIS — Z5329 Procedure and treatment not carried out because of patient's decision for other reasons: Secondary | ICD-10-CM | POA: Diagnosis not present

## 2022-06-26 DIAGNOSIS — Z79899 Other long term (current) drug therapy: Secondary | ICD-10-CM

## 2022-06-26 DIAGNOSIS — R000 Tachycardia, unspecified: Secondary | ICD-10-CM | POA: Diagnosis present

## 2022-06-26 DIAGNOSIS — R7401 Elevation of levels of liver transaminase levels: Secondary | ICD-10-CM | POA: Diagnosis not present

## 2022-06-26 DIAGNOSIS — F10229 Alcohol dependence with intoxication, unspecified: Secondary | ICD-10-CM | POA: Diagnosis present

## 2022-06-26 DIAGNOSIS — Z8249 Family history of ischemic heart disease and other diseases of the circulatory system: Secondary | ICD-10-CM

## 2022-06-26 DIAGNOSIS — Z8349 Family history of other endocrine, nutritional and metabolic diseases: Secondary | ICD-10-CM

## 2022-06-26 DIAGNOSIS — E872 Acidosis, unspecified: Secondary | ICD-10-CM | POA: Diagnosis not present

## 2022-06-26 DIAGNOSIS — Y908 Blood alcohol level of 240 mg/100 ml or more: Secondary | ICD-10-CM | POA: Diagnosis not present

## 2022-06-26 DIAGNOSIS — F10939 Alcohol use, unspecified with withdrawal, unspecified: Secondary | ICD-10-CM

## 2022-06-26 DIAGNOSIS — F10929 Alcohol use, unspecified with intoxication, unspecified: Secondary | ICD-10-CM | POA: Diagnosis not present

## 2022-06-26 DIAGNOSIS — F10129 Alcohol abuse with intoxication, unspecified: Secondary | ICD-10-CM | POA: Diagnosis not present

## 2022-06-26 DIAGNOSIS — E8729 Other acidosis: Secondary | ICD-10-CM

## 2022-06-26 DIAGNOSIS — Z803 Family history of malignant neoplasm of breast: Secondary | ICD-10-CM | POA: Diagnosis not present

## 2022-06-26 DIAGNOSIS — R918 Other nonspecific abnormal finding of lung field: Secondary | ICD-10-CM | POA: Diagnosis not present

## 2022-06-26 DIAGNOSIS — R4182 Altered mental status, unspecified: Secondary | ICD-10-CM | POA: Diagnosis not present

## 2022-06-26 LAB — COMPREHENSIVE METABOLIC PANEL
ALT: 76 U/L — ABNORMAL HIGH (ref 0–44)
AST: 138 U/L — ABNORMAL HIGH (ref 15–41)
Albumin: 4.3 g/dL (ref 3.5–5.0)
Alkaline Phosphatase: 61 U/L (ref 38–126)
Anion gap: 27 — ABNORMAL HIGH (ref 5–15)
BUN: 14 mg/dL (ref 6–20)
CO2: 17 mmol/L — ABNORMAL LOW (ref 22–32)
Calcium: 8.6 mg/dL — ABNORMAL LOW (ref 8.9–10.3)
Chloride: 91 mmol/L — ABNORMAL LOW (ref 98–111)
Creatinine, Ser: 0.72 mg/dL (ref 0.44–1.00)
GFR, Estimated: >60 mL/min (ref >60)
Glucose, Bld: 81 mg/dL (ref 70–99)
Potassium: 3.8 mmol/L (ref 3.5–5.1)
Sodium: 135 mmol/L (ref 135–145)
Total Bilirubin: 1.9 mg/dL — ABNORMAL HIGH (ref 0.3–1.2)
Total Protein: 8 g/dL (ref 6.5–8.1)

## 2022-06-26 LAB — CBC
HCT: 46.1 % — ABNORMAL HIGH (ref 36.0–46.0)
Hemoglobin: 15.9 g/dL — ABNORMAL HIGH (ref 12.0–15.0)
MCH: 29.6 pg (ref 26.0–34.0)
MCHC: 34.5 g/dL (ref 30.0–36.0)
MCV: 85.8 fL (ref 80.0–100.0)
Platelets: 212 10*3/uL (ref 150–400)
RBC: 5.37 MIL/uL — ABNORMAL HIGH (ref 3.87–5.11)
RDW: 13 % (ref 11.5–15.5)
WBC: 12.6 10*3/uL — ABNORMAL HIGH (ref 4.0–10.5)
nRBC: 0 % (ref 0.0–0.2)

## 2022-06-26 LAB — RAPID URINE DRUG SCREEN, HOSP PERFORMED
Amphetamines: NOT DETECTED
Barbiturates: NOT DETECTED
Benzodiazepines: NOT DETECTED
Cocaine: NOT DETECTED
Opiates: NOT DETECTED
Tetrahydrocannabinol: NOT DETECTED

## 2022-06-26 LAB — BASIC METABOLIC PANEL
Anion gap: 18 — ABNORMAL HIGH (ref 5–15)
BUN: 10 mg/dL (ref 6–20)
CO2: 18 mmol/L — ABNORMAL LOW (ref 22–32)
Calcium: 7.9 mg/dL — ABNORMAL LOW (ref 8.9–10.3)
Chloride: 97 mmol/L — ABNORMAL LOW (ref 98–111)
Creatinine, Ser: 0.58 mg/dL (ref 0.44–1.00)
GFR, Estimated: >60 mL/min (ref >60)
Glucose, Bld: 190 mg/dL — ABNORMAL HIGH (ref 70–99)
Potassium: 3.4 mmol/L — ABNORMAL LOW (ref 3.5–5.1)
Sodium: 133 mmol/L — ABNORMAL LOW (ref 135–145)

## 2022-06-26 LAB — MAGNESIUM: Magnesium: 2.1 mg/dL (ref 1.7–2.4)

## 2022-06-26 LAB — LACTIC ACID, PLASMA
Lactic Acid, Venous: 4.9 mmol/L (ref 0.5–1.9)
Lactic Acid, Venous: 3.8 mmol/L (ref 0.5–1.9)
Lactic Acid, Venous: 4.5 mmol/L (ref 0.5–1.9)

## 2022-06-26 LAB — HCG, SERUM, QUALITATIVE: Preg, Serum: NEGATIVE

## 2022-06-26 LAB — ETHANOL
Alcohol, Ethyl (B): 578 mg/dL (ref <10)
Alcohol, Ethyl (B): 316 mg/dL (ref <10)

## 2022-06-26 LAB — PHOSPHORUS: Phosphorus: 3.5 mg/dL (ref 2.5–4.6)

## 2022-06-26 MED ORDER — THIAMINE MONONITRATE 100 MG PO TABS
100.0000 mg | ORAL_TABLET | Freq: Every day | ORAL | Status: DC
  Administered 2022-06-27 – 2022-06-28 (×2): 100 mg via ORAL
  Filled 2022-06-26 (×2): qty 1

## 2022-06-26 MED ORDER — LORAZEPAM 1 MG PO TABS
1.0000 mg | ORAL_TABLET | ORAL | Status: DC | PRN
  Administered 2022-06-27: 3 mg via ORAL
  Administered 2022-06-27 – 2022-06-28 (×3): 2 mg via ORAL
  Administered 2022-06-28: 1 mg via ORAL
  Filled 2022-06-26: qty 2
  Filled 2022-06-26: qty 1
  Filled 2022-06-26: qty 2
  Filled 2022-06-26: qty 3
  Filled 2022-06-26: qty 2

## 2022-06-26 MED ORDER — ONDANSETRON HCL 4 MG PO TABS: 4.0000 mg | ORAL_TABLET | Freq: Four times a day (QID) | ORAL | Status: DC | PRN

## 2022-06-26 MED ORDER — ONDANSETRON HCL 4 MG/2ML IJ SOLN: 4.0000 mg | Freq: Once | INTRAMUSCULAR | Status: AC

## 2022-06-26 MED ORDER — SODIUM CHLORIDE 0.9 % IV BOLUS
1000.0000 mL | Freq: Once | INTRAVENOUS | Status: AC
  Administered 2022-06-26: 1000 mL via INTRAVENOUS

## 2022-06-26 MED ORDER — THIAMINE HCL 100 MG/ML IJ SOLN
100.0000 mg | Freq: Once | INTRAMUSCULAR | Status: AC
  Administered 2022-06-26: 100 mg via INTRAVENOUS
  Filled 2022-06-26: qty 2

## 2022-06-26 MED ORDER — ENOXAPARIN SODIUM 40 MG/0.4ML IJ SOSY
40.0000 mg | PREFILLED_SYRINGE | INTRAMUSCULAR | Status: DC
  Administered 2022-06-27 – 2022-06-28 (×2): 40 mg via SUBCUTANEOUS
  Filled 2022-06-26 (×2): qty 0.4

## 2022-06-26 MED ORDER — ONDANSETRON HCL 4 MG/2ML IJ SOLN
4.0000 mg | Freq: Once | INTRAMUSCULAR | Status: AC
  Administered 2022-06-26: 4 mg via INTRAVENOUS
  Filled 2022-06-26: qty 2

## 2022-06-26 MED ORDER — DEXTROSE-NACL 5-0.9 % IV SOLN: INTRAVENOUS | Status: DC

## 2022-06-26 MED ORDER — ONDANSETRON HCL 4 MG/2ML IJ SOLN
INTRAMUSCULAR | Status: AC
  Administered 2022-06-26: 4 mg via INTRAVENOUS
  Filled 2022-06-26: qty 2

## 2022-06-26 MED ORDER — ADULT MULTIVITAMIN W/MINERALS CH
1.0000 | ORAL_TABLET | Freq: Every day | ORAL | Status: DC
  Administered 2022-06-27 – 2022-06-28 (×2): 1 via ORAL
  Filled 2022-06-26 (×2): qty 1

## 2022-06-26 MED ORDER — ACETAMINOPHEN 650 MG RE SUPP: 650.0000 mg | Freq: Four times a day (QID) | RECTAL | Status: DC | PRN

## 2022-06-26 MED ORDER — FOLIC ACID 1 MG PO TABS
1.0000 mg | ORAL_TABLET | Freq: Every day | ORAL | Status: DC
  Filled 2022-06-26: qty 1

## 2022-06-26 MED ORDER — LACTATED RINGERS IV BOLUS
1000.0000 mL | Freq: Once | INTRAVENOUS | Status: DC
  Administered 2022-06-26: 1000 mL via INTRAVENOUS

## 2022-06-26 MED ORDER — FOLIC ACID 1 MG PO TABS
1.0000 mg | ORAL_TABLET | Freq: Every day | ORAL | Status: DC
  Administered 2022-06-26 – 2022-06-28 (×3): 1 mg via ORAL
  Filled 2022-06-26 (×3): qty 1

## 2022-06-26 MED ORDER — ONDANSETRON HCL 4 MG/2ML IJ SOLN
4.0000 mg | Freq: Four times a day (QID) | INTRAMUSCULAR | Status: DC | PRN
  Administered 2022-06-27 (×2): 4 mg via INTRAVENOUS
  Filled 2022-06-26 (×2): qty 2

## 2022-06-26 MED ORDER — ACETAMINOPHEN 325 MG PO TABS
650.0000 mg | ORAL_TABLET | Freq: Four times a day (QID) | ORAL | Status: DC | PRN
  Administered 2022-06-27: 650 mg via ORAL
  Filled 2022-06-26: qty 2

## 2022-06-26 MED ORDER — THIAMINE HCL 100 MG/ML IJ SOLN: 100.0000 mg | Freq: Every day | INTRAMUSCULAR | Status: DC

## 2022-06-26 MED ORDER — LACTATED RINGERS IV BOLUS
1000.0000 mL | Freq: Once | INTRAVENOUS | Status: AC
  Administered 2022-06-26: 1000 mL via INTRAVENOUS

## 2022-06-26 MED ORDER — LORAZEPAM 2 MG/ML IJ SOLN
2.0000 mg | Freq: Once | INTRAMUSCULAR | Status: AC
  Administered 2022-06-26: 2 mg via INTRAVENOUS
  Filled 2022-06-26: qty 1

## 2022-06-26 MED ORDER — POTASSIUM CHLORIDE 10 MEQ/100ML IV SOLN
10.0000 meq | Freq: Once | INTRAVENOUS | Status: AC
  Administered 2022-06-27: 10 meq via INTRAVENOUS
  Filled 2022-06-26: qty 100

## 2022-06-26 MED ORDER — LORAZEPAM 2 MG/ML IJ SOLN
1.0000 mg | INTRAMUSCULAR | Status: DC | PRN
  Administered 2022-06-26: 2 mg via INTRAVENOUS
  Administered 2022-06-27: 1 mg via INTRAVENOUS
  Administered 2022-06-27 (×4): 2 mg via INTRAVENOUS
  Administered 2022-06-27: 1 mg via INTRAVENOUS
  Administered 2022-06-27: 2 mg via INTRAVENOUS
  Administered 2022-06-27: 4 mg via INTRAVENOUS
  Administered 2022-06-27 – 2022-06-28 (×2): 2 mg via INTRAVENOUS
  Administered 2022-06-28: 3 mg via INTRAVENOUS
  Administered 2022-06-28: 1 mg via INTRAVENOUS
  Filled 2022-06-26 (×2): qty 1
  Filled 2022-06-26: qty 2
  Filled 2022-06-26 (×4): qty 1
  Filled 2022-06-26: qty 2
  Filled 2022-06-26 (×5): qty 1

## 2022-06-26 NOTE — ED Provider Notes (Signed)
[facility]-EMERGENCY DEPT Provider Note   CSN: [id number] Arrival date & time: 06/26/22  1139     History  Chief Complaint  Patient presents with   Alcohol Intoxication    Sherri Harris is a 26 y.o. female with alcohol abuse, alcohol-induced mood disorder presents BIB [facility] from home for intoxication.  Patient's boyfriend, who is also a patient in the ED, reportedly have been drinking for 3 days.  Boyfriend told EMS on scene that they had been laying on the couch and drinking for 3 days, not eating anything.  They have been sharing bottles of vodka.   On arrival, is talking and calm but confused in triage. BG 85 mg/dL. Unable to obtain any significant history from patient, though she is able to state her name, the year, and that she does not have any pain. Per chart review, patient has been seen in the past for alcohol intoxication and been through rehab as well.     Alcohol Intoxication       Home Medications Prior to Admission medications   Medication Sig Start Date End Date Taking? Authorizing Provider  busPIRone (BUSPAR) 5 MG tablet Take 5 mg by mouth 3 (three) times daily. 12/26/21   [provider]  Naltrexone 380 MG SUSR Inject 380 mg into the muscle every 30 (thirty) days. Patient not taking: Reported on 01/28/2022 01/13/22   Lorre Munroe, NP  Norethindrone-Ethinyl Estradiol-Fe Biphas (LO LOESTRIN FE) 1 MG-10 MCG / 10 MCG tablet TAKE 1 TABLET BY MOUTH DAILY. 01/06/22 01/06/23  Mecum, Erin E, PA-C  ondansetron (ZOFRAN) 4 MG tablet Take 1 tablet (4 mg total) by mouth every 6 (six) hours. 06/13/22   Bing Neighbors, FNP  QUEtiapine (SEROQUEL) 100 MG tablet Take 1 tablet (100 mg total) by mouth at bedtime. 02/08/22   Lorre Munroe, NP  sertraline (ZOLOFT) 50 MG tablet Take 1 tablet (50 mg total) by mouth daily. 01/06/22   Mecum, Erin E, PA-C  thiamine 100 MG tablet Take 1 tablet (100 mg total) by mouth every morning. 01/06/22   Mecum, Erin E,  PA-C      Allergies    Amoxicillin and Penicillins    Review of Systems   Review of Systems  Unable to perform ROS: Mental status change    Physical Exam Updated Vital Signs BP 129/88 (BP Location: Left Arm)   Pulse 88   Temp 98.3 F (36.8 C) (Oral)   Resp 13   Wt 62.1 kg   SpO2 100%   BMI 19.94 kg/m  Physical Exam General: sleepy, intoxicated-appearing female, lying in bed.  HEENT: NCAT. No signs of head trauma. PERRLA, Sclera anicteric, MMM, trachea midline. Cardiology: RRR, no murmurs/rubs/gallops. BL radial and DP pulses equal bilaterally.  Resp: Normal respiratory rate and effort. CTAB, no wheezes, rhonchi, crackles.  Abd: Soft, non-tender, non-distended. No rebound tenderness or guarding.  GU: Deferred. MSK: Bruise noted on Left buttock, left humerus. No apparent TTP, no deformity. No peripheral edema or signs of trauma. No cyanosis or clubbing. Skin: warm, dry. No rashes or lesions. Back: No CVA tenderness Neuro: A&Ox2 (self and year), CNs II-XII grossly intact. MAEs. Sensation grossly intact.  Psych: Normal mood and affect.   ED Results / Procedures / Treatments   Labs (all labs ordered are listed, but only abnormal results are displayed) Labs Reviewed  COMPREHENSIVE METABOLIC PANEL - Abnormal; Notable for the following components:      Result Value   Chloride 91 (*)  CO2 17 (*)    Calcium 8.6 (*)    AST 138 (*)    ALT 76 (*)    Total Bilirubin 1.9 (*)    Anion gap 27 (*)    All other components within normal limits  ETHANOL - Abnormal; Notable for the following components:   Alcohol, Ethyl (B) 578 (*)    All other components within normal limits  CBC - Abnormal; Notable for the following components:   WBC 12.6 (*)    RBC 5.37 (*)    Hemoglobin 15.9 (*)    HCT 46.1 (*)    All other components within normal limits  RAPID URINE DRUG SCREEN, HOSP PERFORMED  MAGNESIUM  PHOSPHORUS  HCG, SERUM, QUALITATIVE  LACTIC ACID, PLASMA  LACTIC ACID, PLASMA   URINALYSIS, ROUTINE W REFLEX MICROSCOPIC    EKG None  Radiology No results found.  Procedures .Critical Care  Performed by: Loetta Rough, MD Authorized by: Loetta Rough, MD   Critical care provider statement:    Critical care time (minutes):  30   Critical care was necessary to treat or prevent imminent or life-threatening deterioration of the following conditions:  Metabolic crisis, dehydration and toxidrome   Critical care was time spent personally by me on the following activities:  Discussions with consultants, evaluation of patient's response to treatment, examination of patient, ordering and review of laboratory studies, ordering and review of radiographic studies, ordering and performing treatments and interventions, pulse oximetry, re-evaluation of patient's condition, review of old charts and obtaining history from patient or surrogate   Care discussed with: admitting provider       Medications Ordered in ED Medications  folic acid (FOLVITE) tablet 1 mg (1 mg Oral Not Given 06/26/22 1400)  dextrose 5 %-0.9 % sodium chloride infusion ( Intravenous New Bag/Given 06/26/22 1359)  ondansetron (ZOFRAN) injection 4 mg (4 mg Intravenous Given 06/26/22 1225)  thiamine (VITAMIN B1) injection 100 mg (100 mg Intravenous Given 06/26/22 1349)  sodium chloride 0.9 % bolus 1,000 mL (1,000 mLs Intravenous New Bag/Given 06/26/22 1400)    ED Course/ Medical Decision Making/ A&P                          Medical Decision Making Amount and/or Complexity of Data Reviewed Labs: ordered. Decision-making details documented in ED Course.  Risk Prescription drug management.  Patient HDS, afebrile, sleepy/intoxicated appearing. In clinical s/o known EtOH intoxication endorsed by boyfriend on scene, this is the most likely cause. Considered ICH but no signs of head trauma on exam, considered infection but afebrile and no report of infectious symptoms, considered hypo/hyperglycemia but blood  glucose normal, Will obtain EKG as well but cardiac monitor shows no signs of arrhythmia.   Patient initially received 500 cc of LR via IV and was subsequently given a 1 L normal saline bolus.  Then fluid resuscitated further with D5 normal saline 75 cc/h in order to treat possible starvation ketosis.  Patient given thiamine and folate as well.  Labs demonstrate significant EtOH 578, anion gap 27 indicating possible alcohol ketoacidosis or starvation ketoacidosis, will evaluate UA.  Metabolic acidosis CO2 17, chloride 91.  Mild leukocytosis 12.6.  Hemoglobin 15.9 and he is volume contraction.  No significant renal injury.  Sodium potassium magnesium phosphorus within normal limits.  Transaminitis AST 138, ALT 76, total bilirubin slightly elevated at 1.9, likely result of alcohol abuse.   I have personally reviewed and interpreted all labs.  Clinical Course as of 06/26/22 1515  Thu Jun 26, 2022  1339 Alcohol, Ethyl (B)(!!): 578 [HN]  1342 Anion gap(!): 27 Consider alcohol ketoacidosis, starvation ketoacidosis [HN]  1342 CO2(!): 17 [HN]  1342 Chloride(!): 91 [HN]  1343 WBC(!): 12.6 [HN]  1343 Hemoglobin(!): 15.9 [HN]  1346 Will bolus 1L NS and then start a D5NS gtt to treat potential starvation ketosis [HN]    Clinical Course User Index [HN] Loetta Rough, MD   Patient is signed out to the oncoming ED physician who is made aware of her history, presentation, exam, workup, and plan. Plan is to allow patient to metabolize for a few hours, give IV hydration, and reassess. EKG, UA, beta hcg, lactate pending.          Final Clinical Impression(s) / ED Diagnoses Final diagnoses:  Acute alcoholic intoxication with complication (HCC)  High anion gap metabolic acidosis    Rx / DC Orders ED Discharge Orders     None        This note was created using dictation software, which may contain spelling or grammatical errors.    Loetta Rough, MD 06/26/22 [phone]

## 2022-06-26 NOTE — Assessment & Plan Note (Signed)
Secondary to EtOH, but no N/V, no RUQ tenderness nor abd pain.  Doesn't seem to clinically have significant EtOH hepatitis at this point. 1. Repeat CMP in AM

## 2022-06-26 NOTE — Assessment & Plan Note (Addendum)
BAL 316 which is down from 578 when she presented to ED earlier today. Pt still intoxicated clinically, dont think she is yet withdrawing. Still intoxication complicated by lactic acidosis, S.Tach, and transaminitis. 1. CIWA 2. IVF

## 2022-06-26 NOTE — ED Notes (Addendum)
Pt restless, agitated, apologetic, frustrated. Given snack per request. IV restarted and IVF infusing. Given phone per request, call did not go thru. Labs to be drawn when bolus complete.

## 2022-06-26 NOTE — ED Notes (Signed)
Sitter arrives to [employer]

## 2022-06-26 NOTE — ED Triage Notes (Signed)
BIB [facility] from home for intoxication and AMS after 3 day binge on ETOH, incontinent of urine, no report or obvious fall or injury, however bruises present, no signs of emesis. No signs of emesis. Reportedly: Was drinking with boyfriend, has been on couch for several days, and boyfriend called 911. Arrives alert, NAD, calm, talking to self, confused, semi-cooperative, semi purposeful. Follows some commands. Interactive. Speech intelligible. CBG 85. Oriented to self.

## 2022-06-26 NOTE — Assessment & Plan Note (Addendum)
Seems to be secondary to EtOH intoxication. ? Underlying EtOH ketoacidosis 1. BHB pending 2. IVF with D5-NS 3. Repeat lactate ordered 4. CMP repeat in AM

## 2022-06-26 NOTE — ED Notes (Signed)
MD notified of LA of 4.9

## 2022-06-26 NOTE — ED Provider Notes (Addendum)
Patient very intoxicated with history of alcohol abuse.  Rehydration initiated.  Plan for observation for potential discharge if returns to baseline mental status. Physical Exam  BP 129/88 (BP Location: Left Arm)   Pulse 88   Temp 98.3 F (36.8 C) (Oral)   Resp 13   Wt 62.1 kg   SpO2 100%   BMI 19.94 kg/m   Physical Exam  Procedures  Procedures  ED Course / MDM   Clinical Course as of 06/26/22 1503  Thu Jun 26, 2022  1339 Alcohol, Ethyl (B)(!!): 578 [HN]  1342 Anion gap(!): 27 Consider alcohol ketoacidosis, starvation ketoacidosis [HN]  1342 CO2(!): 17 [HN]  1342 Chloride(!): 91 [HN]  1343 WBC(!): 12.6 [HN]  1343 Hemoglobin(!): 15.9 [HN]  1346 Will bolus 1L NS and then start a D5NS gtt to treat potential starvation ketosis [HN]    Clinical Course User Index [HN] Loetta Rough, MD   Medical Decision Making Amount and/or Complexity of Data Reviewed Labs: ordered. Decision-making details documented in ED Course.  Risk OTC drugs. Prescription drug management.   16: 59 recheck pressure remained stable.  Heart rate is narrow complex on the monitor rates in the 90s.  Patient awakens to light voice.  She has not had any vomiting.  Patient has been tolerating water.  She will try some crackers.  We will plan on rechecking labs after additional liter of fluids.  21: 15 patient remains mildly tachycardic.  She is more tremulous.  CIWA score is 16.  Ativan 2 mg IV administered. 21: 35 despite fluid resuscitation, thiamine, folate and glucose replacement for ketoacidosis, patient's lactic has increased back up to 4.5.  Anion gap is improving but still elevated at 18.  The alcohol has gone from 578-316.  At this time patient is not adequately stabilized for discharge.  She has high risk for alcohol withdrawal complications and continued alcohol induced ketoacidosis.  CRITICAL CARE Performed by: Arby Barrette   Total critical care time: 60 minutes  Critical care time was  exclusive of separately billable procedures and treating other patients.  Critical care was necessary to treat or prevent imminent or life-threatening deterioration.  Critical care was time spent personally by me on the following activities: development of treatment plan with patient and/or surrogate as well as nursing, discussions with consultants, evaluation of patient's response to treatment, examination of patient, obtaining history from patient or surrogate, ordering and performing treatments and interventions, ordering and review of laboratory studies, ordering and review of radiographic studies, pulse oximetry and re-evaluation of patient's condition.      Arby Barrette, MD 06/26/22 1700    Arby Barrette, MD 06/26/22 2151

## 2022-06-26 NOTE — ED Notes (Signed)
Sz pads applied for safety, bed lowered, SR up x2, CBIR, door opened to nearby nurses station, fall risk bracelet applied, bed alarm on.

## 2022-06-26 NOTE — ED Notes (Signed)
Pt pulled IV out. New IV started. Mittens applied.

## 2022-06-26 NOTE — ED Notes (Signed)
Bed alarm and seizure pads placed for pt's safety.

## 2022-06-26 NOTE — ED Notes (Addendum)
Pt refusing to have blood drawn. MD made aware.

## 2022-06-26 NOTE — ED Notes (Signed)
Pt removed purewick, states she does not want it as it is uncomfortable. Pt urinated on self, refuses to be cleaned at this time. Pt given more food and drink per request.

## 2022-06-27 ENCOUNTER — Encounter (HOSPITAL_COMMUNITY): Payer: Self-pay | Admitting: Internal Medicine

## 2022-06-27 ENCOUNTER — Other Ambulatory Visit: Payer: Self-pay

## 2022-06-27 ENCOUNTER — Observation Stay (HOSPITAL_COMMUNITY): Payer: BC Managed Care – PPO

## 2022-06-27 DIAGNOSIS — F10929 Alcohol use, unspecified with intoxication, unspecified: Secondary | ICD-10-CM

## 2022-06-27 DIAGNOSIS — R7401 Elevation of levels of liver transaminase levels: Secondary | ICD-10-CM | POA: Diagnosis not present

## 2022-06-27 DIAGNOSIS — F10939 Alcohol use, unspecified with withdrawal, unspecified: Secondary | ICD-10-CM | POA: Diagnosis not present

## 2022-06-27 DIAGNOSIS — F10239 Alcohol dependence with withdrawal, unspecified: Secondary | ICD-10-CM | POA: Diagnosis present

## 2022-06-27 DIAGNOSIS — Z8249 Family history of ischemic heart disease and other diseases of the circulatory system: Secondary | ICD-10-CM | POA: Diagnosis not present

## 2022-06-27 DIAGNOSIS — Z5329 Procedure and treatment not carried out because of patient's decision for other reasons: Secondary | ICD-10-CM | POA: Diagnosis present

## 2022-06-27 DIAGNOSIS — I10 Essential (primary) hypertension: Secondary | ICD-10-CM | POA: Diagnosis present

## 2022-06-27 DIAGNOSIS — E872 Acidosis, unspecified: Secondary | ICD-10-CM | POA: Diagnosis not present

## 2022-06-27 DIAGNOSIS — E860 Dehydration: Secondary | ICD-10-CM | POA: Diagnosis present

## 2022-06-27 DIAGNOSIS — Z79899 Other long term (current) drug therapy: Secondary | ICD-10-CM | POA: Diagnosis not present

## 2022-06-27 DIAGNOSIS — F10229 Alcohol dependence with intoxication, unspecified: Secondary | ICD-10-CM

## 2022-06-27 DIAGNOSIS — Z8349 Family history of other endocrine, nutritional and metabolic diseases: Secondary | ICD-10-CM | POA: Diagnosis not present

## 2022-06-27 DIAGNOSIS — R918 Other nonspecific abnormal finding of lung field: Secondary | ICD-10-CM | POA: Diagnosis not present

## 2022-06-27 DIAGNOSIS — E8729 Other acidosis: Secondary | ICD-10-CM

## 2022-06-27 DIAGNOSIS — Z880 Allergy status to penicillin: Secondary | ICD-10-CM | POA: Diagnosis not present

## 2022-06-27 DIAGNOSIS — Z803 Family history of malignant neoplasm of breast: Secondary | ICD-10-CM | POA: Diagnosis not present

## 2022-06-27 DIAGNOSIS — R000 Tachycardia, unspecified: Secondary | ICD-10-CM | POA: Diagnosis not present

## 2022-06-27 DIAGNOSIS — Y908 Blood alcohol level of 240 mg/100 ml or more: Secondary | ICD-10-CM | POA: Diagnosis present

## 2022-06-27 DIAGNOSIS — R4182 Altered mental status, unspecified: Secondary | ICD-10-CM | POA: Diagnosis not present

## 2022-06-27 LAB — CBC
HCT: 37.6 % (ref 36.0–46.0)
Hemoglobin: 13.2 g/dL (ref 12.0–15.0)
MCH: 30.1 pg (ref 26.0–34.0)
MCHC: 35.1 g/dL (ref 30.0–36.0)
MCV: 85.8 fL (ref 80.0–100.0)
Platelets: 152 10*3/uL (ref 150–400)
RBC: 4.38 MIL/uL (ref 3.87–5.11)
RDW: 12.6 % (ref 11.5–15.5)
WBC: 8.4 10*3/uL (ref 4.0–10.5)
nRBC: 0 % (ref 0.0–0.2)

## 2022-06-27 LAB — COMPREHENSIVE METABOLIC PANEL
ALT: 63 U/L — ABNORMAL HIGH (ref 0–44)
AST: 106 U/L — ABNORMAL HIGH (ref 15–41)
Albumin: 3.2 g/dL — ABNORMAL LOW (ref 3.5–5.0)
Alkaline Phosphatase: 48 U/L (ref 38–126)
Anion gap: 15 (ref 5–15)
BUN: 8 mg/dL (ref 6–20)
CO2: 22 mmol/L (ref 22–32)
Calcium: 8 mg/dL — ABNORMAL LOW (ref 8.9–10.3)
Chloride: 93 mmol/L — ABNORMAL LOW (ref 98–111)
Creatinine, Ser: 0.44 mg/dL (ref 0.44–1.00)
GFR, Estimated: >60 mL/min (ref >60)
Glucose, Bld: 200 mg/dL — ABNORMAL HIGH (ref 70–99)
Potassium: 2.9 mmol/L — ABNORMAL LOW (ref 3.5–5.1)
Sodium: 130 mmol/L — ABNORMAL LOW (ref 135–145)
Total Bilirubin: 1.5 mg/dL — ABNORMAL HIGH (ref 0.3–1.2)
Total Protein: 6 g/dL — ABNORMAL LOW (ref 6.5–8.1)

## 2022-06-27 LAB — LACTIC ACID, PLASMA
Lactic Acid, Venous: 3.8 mmol/L (ref 0.5–1.9)
Lactic Acid, Venous: 2.9 mmol/L (ref 0.5–1.9)

## 2022-06-27 LAB — PHOSPHORUS: Phosphorus: <1 mg/dL — CL (ref 2.5–4.6)

## 2022-06-27 LAB — MAGNESIUM: Magnesium: 1.4 mg/dL — ABNORMAL LOW (ref 1.7–2.4)

## 2022-06-27 LAB — TSH: TSH: 1.91 u[IU]/mL (ref 0.350–4.500)

## 2022-06-27 LAB — URINALYSIS, ROUTINE W REFLEX MICROSCOPIC
Bacteria, UA: NONE SEEN
Bilirubin Urine: NEGATIVE
Glucose, UA: NEGATIVE mg/dL
Ketones, ur: 20 mg/dL — AB
Leukocytes,Ua: NEGATIVE
Nitrite: NEGATIVE
Protein, ur: NEGATIVE mg/dL
Specific Gravity, Urine: 1.003 — ABNORMAL LOW (ref 1.005–1.030)
pH: 5 (ref 5.0–8.0)

## 2022-06-27 LAB — D-DIMER, QUANTITATIVE: D-Dimer, Quant: 13.32 ug/mL-FEU — ABNORMAL HIGH (ref 0.00–0.50)

## 2022-06-27 LAB — HIV ANTIBODY (ROUTINE TESTING W REFLEX): HIV Screen 4th Generation wRfx: NONREACTIVE

## 2022-06-27 LAB — BETA-HYDROXYBUTYRIC ACID: Beta-Hydroxybutyric Acid: 0.42 mmol/L — ABNORMAL HIGH (ref 0.05–0.27)

## 2022-06-27 MED ORDER — MAGNESIUM SULFATE 2 GM/50ML IV SOLN
2.0000 g | Freq: Once | INTRAVENOUS | Status: AC
  Administered 2022-06-27: 2 g via INTRAVENOUS
  Filled 2022-06-27: qty 50

## 2022-06-27 MED ORDER — SODIUM CHLORIDE (PF) 0.9 % IJ SOLN
INTRAMUSCULAR | Status: AC
  Filled 2022-06-27: qty 50

## 2022-06-27 MED ORDER — POTASSIUM PHOSPHATES 15 MMOLE/5ML IV SOLN
30.0000 mmol | Freq: Once | INTRAVENOUS | Status: AC
  Administered 2022-06-27: 30 mmol via INTRAVENOUS
  Filled 2022-06-27: qty 10

## 2022-06-27 MED ORDER — IOHEXOL 350 MG/ML SOLN
100.0000 mL | Freq: Once | INTRAVENOUS | Status: AC | PRN
  Administered 2022-06-27: 80 mL via INTRAVENOUS

## 2022-06-27 MED ORDER — POTASSIUM CHLORIDE CRYS ER 20 MEQ PO TBCR
40.0000 meq | EXTENDED_RELEASE_TABLET | Freq: Once | ORAL | Status: AC
  Administered 2022-06-27: 40 meq via ORAL
  Filled 2022-06-27: qty 2

## 2022-06-27 MED ORDER — K PHOS MONO-SOD PHOS DI & MONO 155-852-130 MG PO TABS
500.0000 mg | ORAL_TABLET | Freq: Four times a day (QID) | ORAL | Status: AC
  Administered 2022-06-27 (×2): 500 mg via ORAL
  Filled 2022-06-27 (×5): qty 2

## 2022-06-27 MED ORDER — SODIUM CHLORIDE 0.9 % IV SOLN: INTRAVENOUS | Status: DC

## 2022-06-27 NOTE — ED Notes (Signed)
Pt pulled out iv and states she wants to go home.

## 2022-06-27 NOTE — Care Plan (Signed)
This 26 years old female with PMH significant for EtOH abuse that is severe and ongoing.  Patient was admitted in March when her BAL was up to 609 and she put herself into alcoholic coma requiring intubation.  Patient and her boyfriend has been drinking for last 3 days and not eating anything.  They have been sharing bottles of vodka.  BAL noted to be 578.  Patient was found to have tremulousness, remain under the effect. Patient is admitted for EtOH withdrawal,  continued on IV hydration, CIWA protocol.  Patient was seen and examined , she is alert, oriented x3, arousable but has tremulousness.

## 2022-06-27 NOTE — Progress Notes (Signed)
IV Ativan now given for ETOH CIWA protocol. Patient with loss of IV Site and IV Team has now restarted Peripheral IV and Medication given

## 2022-06-27 NOTE — ED Notes (Signed)
Per MD to send pt upstair and will come up and talk to her there.

## 2022-06-27 NOTE — ED Notes (Signed)
Pt changed into clean gown, iv started.

## 2022-06-27 NOTE — H&P (Signed)
History and Physical    Patient: Sherri Harris YTK:[id number] DOB: 1995-12-19 DOA: 06/26/2022 DOS: the patient was seen and examined on 06/27/2022 PCP: Lorre Munroe, NP  Patient coming from: Home  Chief Complaint:  Chief Complaint  Patient presents with   Alcohol Intoxication   HPI: Sherri Harris is a 26 y.o. female with medical history significant of EtOH abuse that is severe and ongoing.  Unfortunately she has history of extreme drinking without stopping: In March of this year for example she got her BAL up to 609 and put herself into an alcoholic coma requiring intubation.  Per patient's boyfriend, who is also a patient in the ED, reportedly have been drinking for 3 days.  Boyfriend told EMS on scene that they had been laying on the couch and drinking for 3 days, not eating anything.  They have been sharing bottles of vodka.  Initially unable to get any history from patient per EDP (secondary to patients initial BAL being 578 no doubt), however over 8h or so she has become more alert as she has metabolized down to 316 by 8pm.  Pt denies any other substance abuse (other than drinking way too much).  Denies abd pain, chest pain, N/V, leg swelling, or other symptoms.  Pt eating some in ED.  Lactate however remains persistently elevated and pt persistently tachycardic despite IVF.   Review of Systems: As mentioned in the history of present illness. All other systems reviewed and are negative. Past Medical History:  Diagnosis Date   Anxiety    Hypertension    Past Surgical History:  Procedure Laterality Date   TYMPANOSTOMY TUBE PLACEMENT     WISDOM TOOTH EXTRACTION     Social History:  reports that she has never smoked. She has never used smokeless tobacco. She reports current alcohol use. She reports that she does not use drugs.  Allergies  Allergen Reactions   Amoxicillin Rash    REACTION: rash   Penicillins Rash    Family History  Problem Relation Age of Onset    Hypertension Mother    Thyroid disease Mother    Hypertension Maternal Aunt    Hypertension Maternal Uncle    Alcohol abuse Maternal Uncle    Heart disease Maternal Grandmother    Cancer Maternal Grandmother        Breast   Hypertension Father    Cardiomyopathy Father    Alcohol abuse Maternal Grandfather    Alcohol abuse Paternal Uncle     Prior to Admission medications   Medication Sig Start Date End Date Taking? Authorizing Provider  busPIRone (BUSPAR) 5 MG tablet Take 5 mg by mouth 3 (three) times daily. 12/26/21   [provider]  Naltrexone 380 MG SUSR Inject 380 mg into the muscle every 30 (thirty) days. Patient not taking: Reported on 01/28/2022 01/13/22   Lorre Munroe, NP  Norethindrone-Ethinyl Estradiol-Fe Biphas (LO LOESTRIN FE) 1 MG-10 MCG / 10 MCG tablet TAKE 1 TABLET BY MOUTH DAILY. Patient not taking: Reported on 06/26/2022 01/06/22 01/06/23  Mecum, Erin E, PA-C  ondansetron (ZOFRAN) 4 MG tablet Take 1 tablet (4 mg total) by mouth every 6 (six) hours. Patient not taking: Reported on 06/26/2022 06/13/22   Bing Neighbors, FNP  QUEtiapine (SEROQUEL) 100 MG tablet Take 1 tablet (100 mg total) by mouth at bedtime. 02/08/22   Lorre Munroe, NP  sertraline (ZOLOFT) 50 MG tablet Take 1 tablet (50 mg total) by mouth daily. 01/06/22   Mecum, Denny Peon  E, PA-C  thiamine 100 MG tablet Take 1 tablet (100 mg total) by mouth every morning. 01/06/22   Mecum, Oswaldo Conroy, PA-C    Physical Exam: Vitals:   06/26/22 2200 06/26/22 2215 06/26/22 2300 06/26/22 2338  BP: 136/85  131/76   Pulse: (!) 111 (!) 129 (!) 111   Resp: 16 18 16    Temp:    97.9 F (36.6 C)  TempSrc:    Oral  SpO2: 98% 99% 93%   Weight:       Constitutional: NAD, calm, Sedated Eyes: PERRL, lids and conjunctivae normal ENMT: Mucous membranes are moist. Posterior pharynx clear of any exudate or lesions.Normal dentition.  Neck: normal, supple, no masses, no thyromegaly Respiratory: clear to auscultation  bilaterally, no wheezing, no crackles. Normal respiratory effort. No accessory muscle use.  Cardiovascular: Tachycardic Abdomen: no tenderness, no masses palpated. No hepatosplenomegaly. Bowel sounds positive.  Musculoskeletal: no clubbing / cyanosis. No joint deformity upper and lower extremities. Good ROM, no contractures. Normal muscle tone.  Skin: no rashes, lesions, ulcers. No induration Neurologic: CN 2-12 grossly intact. Sensation intact, DTR normal. Strength 5/5 in all 4.  Psychiatric: Intoxicated, sedated  Data Reviewed:    EKG shows S.Tach with diffuse T wave abnormalities; however, this appears chronic and baseline and was present on 2019 EKG as well.     Latest Ref Rng & Units 06/26/2022   12:16 PM 01/06/2022    4:30 PM 02/13/2021    4:13 PM  CBC  WBC 4.0 - 10.5 K/uL 12.6  8.6  8.7   Hemoglobin 12.0 - 15.0 g/dL 04/15/2021  19.1  47.8   Hematocrit 36.0 - 46.0 % 46.1  39.5  45.9   Platelets 150 - 400 K/uL 212  296  497       Latest Ref Rng & Units 06/26/2022    8:20 PM 06/26/2022   12:16 PM 01/28/2022   11:08 AM  CMP  Glucose 70 - 99 mg/dL 01/30/2022  81  72   BUN 6 - 20 mg/dL 10  14  7    Creatinine 0.44 - 1.00 mg/dL 621   3.08   Sodium 135 - 145 mmol/L 133  135  138   Potassium 3.5 - 5.1 mmol/L 3.4  3.8  4.3   Chloride 98 - 111 mmol/L 97  91  104   CO2 22 - 32 mmol/L 18  17  23    Calcium 8.9 - 10.3 mg/dL 7.9  8.6  9.7   Total Protein 6.5 - 8.1 g/dL  8.0  7.0   Total Bilirubin 0.3 - 1.2 mg/dL  1.9  0.6   Alkaline Phos 38 - 126 U/L  61    AST 15 - 41 U/L  138  19   ALT 0 - 44 U/L  76  22      Lactate: 4.9 -> 3.8 -> 4.5    Assessment and Plan: * Lactic acidosis Seems to be secondary to EtOH intoxication. ? Underlying EtOH ketoacidosis BHB pending IVF with D5-NS Repeat lactate ordered CMP repeat in AM  Alcohol intoxication in active alcoholic with complication (HCC) BAL 316 which is down from 578 when she presented to ED earlier today. Pt still intoxicated  clinically, dont think she is yet withdrawing. Still intoxication complicated by lactic acidosis, S.Tach, and transaminitis. CIWA IVF  Sinus tachycardia Likely primarily dehydration from EtOH, however still somewhat persistent despite IVF and ativan. Cont IVF Tele monitor Check TSH Check D.Dimer Check CXR  Transaminitis Secondary  to EtOH, but no N/V, no RUQ tenderness nor abd pain.  Doesn't seem to clinically have significant EtOH hepatitis at this point. Repeat CMP in AM      Advance Care Planning:   Code Status: Full Code  Consults: None  Family Communication: No family in room, boyfriend is also being admitted for EtOH poisoning per EDP.  Severity of Illness: The appropriate patient status for this patient is OBSERVATION. Observation status is judged to be reasonable and necessary in order to provide the required intensity of service to ensure the patient's safety. The patient's presenting symptoms, physical exam findings, and initial radiographic and laboratory data in the context of their medical condition is felt to place them at decreased risk for further clinical deterioration. Furthermore, it is anticipated that the patient will be medically stable for discharge from the hospital within 2 midnights of admission.   Author: Hillary Bow., DO 06/27/2022 12:01 AM  For on call review [email or web address].

## 2022-06-27 NOTE — Assessment & Plan Note (Signed)
Likely primarily dehydration from EtOH, however still somewhat persistent despite IVF and ativan. 1. Cont IVF 2. Tele monitor 3. Check TSH 4. Check D.Dimer 5. Check CXR

## 2022-06-27 NOTE — Progress Notes (Signed)
Lab work this AM concerning for refeeding syndrome: Phos < 1.0 down from 3.5, Mg 1.6 down from 2.1, K 2.9.  K phos 500mg  (16 mmol) PO QID x1 day first dose now 2g Mg sulfate IV x1 KCl 40 meq PO x1 (also getting 1 run IV already). Consult to registered dietitian re: Refeeding syndrome Stop D5 NS and instead use just NS without D5 for the moment. Anion gap appears closed anyhow  Lactate 3.8.  Also pt on OCP with persistent tachycardia and lactic acidosis and now d.dimer is very elevated at 13.3!  Will order CTA chest to r/o PE.

## 2022-06-27 NOTE — ED Notes (Signed)
Pt agreed to brief change and pericare. Purewick replaced. Pt tolerated well.

## 2022-06-27 NOTE — ED Notes (Signed)
Explained to pt that the plan is to dc her tomorrow and that another iv is necessary but pt refuses another iv. Pt refuses to let this RN change soiled linen.

## 2022-06-27 NOTE — ED Notes (Signed)
Pt returned from CT scan.

## 2022-06-27 NOTE — H&P (Incomplete)
History and Physical    Patient: Sherri Harris:[id number] DOB: [birth year] DOA: 06/26/2022 DOS: the patient was seen and examined on 06/27/2022 PCP: Lorre Munroe, NP  Patient coming from: Home  Chief Complaint:  Chief Complaint  Patient presents with  . Alcohol Intoxication   HPI: Sherri Harris is a 26 y.o. female with medical history significant of ***  Review of Systems: As mentioned in the history of present illness. All other systems reviewed and are negative. Past Medical History:  Diagnosis Date  . Anxiety   . Hypertension    Past Surgical History:  Procedure Laterality Date  . TYMPANOSTOMY TUBE PLACEMENT    . WISDOM TOOTH EXTRACTION     Social History:  reports that she has never smoked. She has never used smokeless tobacco. She reports current alcohol use. She reports that she does not use drugs.  Allergies  Allergen Reactions  . Amoxicillin Rash    REACTION: rash  . Penicillins Rash    Family History  Problem Relation Age of Onset  . Hypertension Mother   . Thyroid disease Mother   . Hypertension Maternal Aunt   . Hypertension Maternal Uncle   . Alcohol abuse Maternal Uncle   . Heart disease Maternal Grandmother   . Cancer Maternal Grandmother        Breast  . Hypertension Father   . Cardiomyopathy Father   . Alcohol abuse Maternal Grandfather   . Alcohol abuse Paternal Uncle     Prior to Admission medications   Medication Sig Start Date End Date Taking? Authorizing Provider  busPIRone (BUSPAR) 5 MG tablet Take 5 mg by mouth 3 (three) times daily. 12/26/21   [provider]  Naltrexone 380 MG SUSR Inject 380 mg into the muscle every 30 (thirty) days. Patient not taking: Reported on 01/28/2022 01/13/22   Lorre Munroe, NP  Norethindrone-Ethinyl Estradiol-Fe Biphas (LO LOESTRIN FE) 1 MG-10 MCG / 10 MCG tablet TAKE 1 TABLET BY MOUTH DAILY. Patient not taking: Reported on 06/26/2022 01/06/22 01/06/23  Mecum, Erin E, PA-C  ondansetron (ZOFRAN)  4 MG tablet Take 1 tablet (4 mg total) by mouth every 6 (six) hours. Patient not taking: Reported on 06/26/2022 06/13/22   Bing Neighbors, FNP  QUEtiapine (SEROQUEL) 100 MG tablet Take 1 tablet (100 mg total) by mouth at bedtime. 02/08/22   Lorre Munroe, NP  sertraline (ZOLOFT) 50 MG tablet Take 1 tablet (50 mg total) by mouth daily. 01/06/22   Mecum, Erin E, PA-C  thiamine 100 MG tablet Take 1 tablet (100 mg total) by mouth every morning. 01/06/22   Mecum, Oswaldo Conroy, PA-C    Physical Exam: Vitals:   06/26/22 2200 06/26/22 2215 06/26/22 2300 06/26/22 2338  BP: 136/85  131/76   Pulse: (!) 111 (!) 129 (!) 111   Resp: 16 18 16    Temp:    97.9 F (36.6 C)  TempSrc:    Oral  SpO2: 98% 99% 93%   Weight:       Constitutional: NAD, calm, Sedated Eyes: PERRL, lids and conjunctivae normal ENMT: Mucous membranes are moist. Posterior pharynx clear of any exudate or lesions.Normal dentition.  Neck: normal, supple, no masses, no thyromegaly Respiratory: clear to auscultation bilaterally, no wheezing, no crackles. Normal respiratory effort. No accessory muscle use.  Cardiovascular: Tachycardic Abdomen: no tenderness, no masses palpated. No hepatosplenomegaly. Bowel sounds positive.  Musculoskeletal: no clubbing / cyanosis. No joint deformity upper and lower extremities. Good ROM, no contractures. Normal muscle  tone.  Skin: no rashes, lesions, ulcers. No induration Neurologic: CN 2-12 grossly intact. Sensation intact, DTR normal. Strength 5/5 in all 4.  Psychiatric: In  Data Reviewed: {Tip this will not be part of the note when signed- Document your independent interpretation of telemetry tracing, EKG, lab, Radiology test or any other diagnostic tests. Add any new diagnostic test ordered today. (Optional):[id number]}   EKG shows S.Tach with diffuse T wave abnormalities; however, this appears chronic and baseline and was present on 2019 EKG as well.     Latest Ref Rng & Units 06/26/2022   12:16 PM  01/06/2022    4:30 PM 02/13/2021    4:13 PM  CBC  WBC 4.0 - 10.5 K/uL 12.6  8.6  8.7   Hemoglobin 12.0 - 15.0 g/dL 69.6  29.5  28.4   Hematocrit 36.0 - 46.0 % 46.1  39.5  45.9   Platelets 150 - 400 K/uL 212  296  497       Latest Ref Rng & Units 06/26/2022    8:20 PM 06/26/2022   12:16 PM 01/28/2022   11:08 AM  CMP  Glucose 70 - 99 mg/dL 132  81  72   BUN 6 - 20 mg/dL 10  14  7    Creatinine 0.44 - 1.00 mg/dL  4.40  1.02   Sodium 135 - 145 mmol/L 133  135  138   Potassium 3.5 - 5.1 mmol/L 3.4  3.8  4.3   Chloride 98 - 111 mmol/L 97  91  104   CO2 22 - 32 mmol/L 18  17  23    Calcium 8.9 - 10.3 mg/dL 7.9  8.6  9.7   Total Protein 6.5 - 8.1 g/dL  8.0  7.0   Total Bilirubin 0.3 - 1.2 mg/dL  1.9  0.6   Alkaline Phos 38 - 126 U/L  61    AST 15 - 41 U/L  138  19   ALT 0 - 44 U/L  76  22      Lactate: 4.9 -> 3.8 -> 4.5    Assessment and Plan: * Lactic acidosis Seems to be secondary to EtOH intoxication. ? Underlying EtOH ketoacidosis BHB pending IVF with D5-NS Repeat lactate ordered CMP repeat in AM  Alcohol intoxication in active alcoholic with complication (HCC) BAL 316 which is down from 578 when she presented to ED earlier today. Pt still intoxicated clinically, dont think she is yet withdrawing. Still intoxication complicated by lactic acidosis, S.Tach, and transaminitis. CIWA IVF  Sinus tachycardia Likely primarily dehydration from EtOH, however still somewhat persistent despite IVF and ativan. Cont IVF Tele monitor Check TSH Check D.Dimer Check CXR  Transaminitis Secondary to EtOH, but no N/V, no RUQ tenderness nor abd pain.  Doesn't seem to clinically have significant EtOH hepatitis at this point. Repeat CMP in AM      Advance Care Planning:   Code Status: Full Code  Consults: None  Family Communication: No family in room, boyfriend is also being admitted for EtOH poisoning per EDP.  Severity of Illness: The appropriate patient status for this  patient is OBSERVATION. Observation status is judged to be reasonable and necessary in order to provide the required intensity of service to ensure the patient's safety. The patient's presenting symptoms, physical exam findings, and initial radiographic and laboratory data in the context of their medical condition is felt to place them at decreased risk for further clinical deterioration. Furthermore, it is anticipated that the patient will be  medically stable for discharge from the hospital within 2 midnights of admission.   Author: Hillary Bow., DO 06/27/2022 12:01 AM  For on call review [email or web address].

## 2022-06-28 DIAGNOSIS — E872 Acidosis, unspecified: Secondary | ICD-10-CM | POA: Diagnosis not present

## 2022-06-28 LAB — CBC
HCT: 38.4 % (ref 36.0–46.0)
Hemoglobin: 13.2 g/dL (ref 12.0–15.0)
MCH: 29.5 pg (ref 26.0–34.0)
MCHC: 34.4 g/dL (ref 30.0–36.0)
MCV: 85.9 fL (ref 80.0–100.0)
Platelets: 132 10*3/uL — ABNORMAL LOW (ref 150–400)
RBC: 4.47 MIL/uL (ref 3.87–5.11)
RDW: 12.4 % (ref 11.5–15.5)
WBC: 6.7 10*3/uL (ref 4.0–10.5)
nRBC: 0 % (ref 0.0–0.2)

## 2022-06-28 LAB — PHOSPHORUS: Phosphorus: 2.6 mg/dL (ref 2.5–4.6)

## 2022-06-28 LAB — COMPREHENSIVE METABOLIC PANEL
ALT: 55 U/L — ABNORMAL HIGH (ref 0–44)
AST: 69 U/L — ABNORMAL HIGH (ref 15–41)
Albumin: 3.5 g/dL (ref 3.5–5.0)
Alkaline Phosphatase: 51 U/L (ref 38–126)
Anion gap: 7 (ref 5–15)
BUN: <5 mg/dL — ABNORMAL LOW (ref 6–20)
CO2: 30 mmol/L (ref 22–32)
Calcium: 9.1 mg/dL (ref 8.9–10.3)
Chloride: 96 mmol/L — ABNORMAL LOW (ref 98–111)
Creatinine, Ser: 0.5 mg/dL (ref 0.44–1.00)
GFR, Estimated: >60 mL/min (ref >60)
Glucose, Bld: 110 mg/dL — ABNORMAL HIGH (ref 70–99)
Potassium: 2.9 mmol/L — ABNORMAL LOW (ref 3.5–5.1)
Sodium: 133 mmol/L — ABNORMAL LOW (ref 135–145)
Total Bilirubin: 1.6 mg/dL — ABNORMAL HIGH (ref 0.3–1.2)
Total Protein: 6.2 g/dL — ABNORMAL LOW (ref 6.5–8.1)

## 2022-06-28 LAB — LACTIC ACID, PLASMA: Lactic Acid, Venous: 1.5 mmol/L (ref 0.5–1.9)

## 2022-06-28 LAB — MAGNESIUM: Magnesium: 1.8 mg/dL (ref 1.7–2.4)

## 2022-06-28 MED ORDER — POTASSIUM CHLORIDE 20 MEQ PO PACK
40.0000 meq | PACK | Freq: Once | ORAL | Status: AC
  Administered 2022-06-28: 40 meq via ORAL
  Filled 2022-06-28: qty 2

## 2022-06-28 MED ORDER — ENSURE ENLIVE PO LIQD: 237.0000 mL | Freq: Two times a day (BID) | ORAL | Status: DC

## 2022-06-28 NOTE — Discharge Summary (Signed)
Physician Discharge Summary  Sherri Harris QAS:[id number] DOB: 1996/07/28 DOA: 06/26/2022  PCP: Sherri Fenton, NP  Admit date: 06/26/2022  Discharge date: 06/28/2022  Admitted From: Home Disposition:  Left against medical advice  Recommendations for Outpatient Follow-up:  Follow up with PCP in 1-2 weeks Please obtain BMP/CBC in one week Patient left [place]: None Equipment/Devices: None  Discharge Condition: Stable CODE STATUS:Full code Diet recommendation: Heart Healthy   Brief [facility] Course: This 26 years old female with PMH significant for EtOH abuse that is severe and ongoing.  Patient was admitted in March when her BAL was up to 609 and she put herself into alcoholic coma requiring intubation.  Patient and her boyfriend has been drinking for last 3 days and not eating anything.  They have been sharing bottles of vodka.  BAL noted to be 578.  Patient was found to have tremulousness, remain under the effect. Patient was admitted for EtOH intoxication /withdrawal,  continued on IV hydration, CIWA protocol.  Patient was managed closely.  Electrolytes were replaced.  Continued on IV hydration and CIWA protocol.  She continued to remain tremulous, states she wants to be discharged.  Patient was started on diet, She has tolerated breakfast in the morning, she was fully awake, oriented x3, following commands, denies any hallucinations. She was still tremulous,  advised to stay 1 more day to get IV hydration and will be discharged tomorrow.  Patient wants to leave and signed AMA.  She understood the risk of leaving.  Discharge Diagnoses:  Principal Problem:   Lactic acidosis Active Problems:   Alcohol intoxication in active alcoholic with complication [facility])   Sinus tachycardia   Transaminitis  Discharge Instructions Patient left AGAINST MEDICAL ADVICE   Allergies  Allergen Reactions   Amoxicillin Rash    REACTION: rash   Penicillins Rash     Consultations: None   Procedures/Studies: CT Angio Chest Pulmonary Embolism (PE) W or WO Contrast  Result Date: 06/27/2022 CLINICAL DATA:  Altered mental status. EXAM: CT ANGIOGRAPHY CHEST WITH CONTRAST TECHNIQUE: Multidetector CT imaging of the chest was performed using the standard protocol during bolus administration of intravenous contrast. Multiplanar CT image reconstructions and MIPs were obtained to evaluate the vascular anatomy. RADIATION DOSE REDUCTION: This exam was performed according to the departmental dose-optimization program which includes automated exposure control, adjustment of the mA and/or kV according to patient size and/or use of iterative reconstruction technique. CONTRAST:  80mL OMNIPAQUE IOHEXOL 350 MG/ML SOLN COMPARISON:  None Available. FINDINGS: Cardiovascular: Satisfactory opacification of the pulmonary arteries to the segmental level. No evidence of pulmonary embolism. Normal heart size. No pericardial effusion. Mediastinum/Nodes: No enlarged mediastinal, hilar, or axillary lymph nodes. Thyroid gland, trachea, and esophagus demonstrate no significant findings. Lungs/Pleura: Lungs are clear. No pleural effusion or pneumothorax. Upper Abdomen: There is diffuse fatty infiltration of the liver parenchyma. Musculoskeletal: No chest wall abnormality. No acute or significant osseous findings. Review of the MIP images confirms the above findings. IMPRESSION: 1. No CT evidence of pulmonary embolism or other acute intrathoracic pathology. 2. Hepatic steatosis. Electronically Signed   By: Virgina Norfolk M.D.   On: 06/27/2022 02:26   DG CHEST PORT 1 VIEW  Result Date: 06/26/2022 CLINICAL DATA:  Lactic acidosis EXAM: PORTABLE CHEST 1 VIEW COMPARISON:  11/09/2019 FINDINGS: The heart size and mediastinal contours are within normal limits. Both lungs are clear. The visualized skeletal structures are unremarkable. IMPRESSION: No active disease. Electronically Signed   By: Maudie Mercury  [name] M.D.   On: 06/26/2022 23:36     Subjective: Patient was seen and examined at bedside.  Overnight events noted. Patient remains fully awake and oriented following commands but she remains tremulous wants to leave AGAINST MEDICAL ADVICE. Patient left AGAINST MEDICAL ADVICE.  Discharge Exam: Vitals:   06/28/22 0641 06/28/22 0940  BP: (!) 118/94 123/80  Pulse: (!) 114 93  Resp:    Temp:    SpO2:  98%   Vitals:   06/28/22 0522 06/28/22 0526 06/28/22 0641 06/28/22 0940  BP: 132/89 (!) 125/95 (!) 118/94 123/80  Pulse: (!) 107 (!) 104 (!) 114 93  Resp:  20    Temp:  98.8 F (37.1 C)    TempSrc:  Oral    SpO2: 99% 95%  98%  Weight:      Height:        General: Pt is alert, awake, not in acute distress Cardiovascular: RRR, S1/S2 +, no rubs, no gallops Respiratory: CTA bilaterally, no wheezing, no rhonchi Abdominal: Soft, NT, ND, bowel sounds + Extremities: no edema, no cyanosis    The results of significant diagnostics from this hospitalization (including imaging, microbiology, ancillary and laboratory) are listed below for reference.     Microbiology: No results found for this or any previous visit (from the past 240 hour(s)).   Labs: BNP (last 3 results) No results for input(s): "BNP" in the last 8760 hours. Basic Metabolic Panel: Recent Labs  Lab 06/26/22 1216 06/26/22 1346 06/26/22 2020 06/27/22 0036 06/28/22 0542  NA 135  --  133* 130* 133*  K 3.8  --  3.4* 2.9* 2.9*  CL 91*  --  97* 93* 96*  CO2 17*  --  18* 22 30  GLUCOSE 81  --  190* 200* 110*  BUN 14  --  10 8 <5*  CREATININE 0.72  --  0.58 0.44 0.50  CALCIUM 8.6*  --  7.9* 8.0* 9.1  MG  --  2.1  --  1.4* 1.8  PHOS  --  3.5  --  <1.0* 2.6   Liver Function Tests: Recent Labs  Lab 06/26/22 1216 06/27/22 0036 06/28/22 0542  AST 138* 106* 69*  ALT 76* 63* 55*  ALKPHOS 61 48 51  BILITOT 1.9* 1.5* 1.6*  PROT 8.0 6.0* 6.2*  ALBUMIN 4.3 3.2* 3.5   No results for input(s): "LIPASE",  "AMYLASE" in the last 168 hours. No results for input(s): "AMMONIA" in the last 168 hours. CBC: Recent Labs  Lab 06/26/22 1216 06/27/22 0036 06/28/22 0542  WBC 12.6* 8.4 6.7  HGB 15.9* 13.2 13.2  HCT 46.1* 37.6 38.4  MCV 85.8 85.8 85.9  PLT 212 152 132*   Cardiac Enzymes: No results for input(s): "CKTOTAL", "CKMB", "CKMBINDEX", "TROPONINI" in the last 168 hours. BNP: Invalid input(s): "POCBNP" CBG: No results for input(s): "GLUCAP" in the last 168 hours. D-Dimer Recent Labs    06/27/22 0036  DDIMER 13.32*   Hgb A1c No results for input(s): "HGBA1C" in the last 72 hours. Lipid Profile No results for input(s): "CHOL", "HDL", "LDLCALC", "TRIG", "CHOLHDL", "LDLDIRECT" in the last 72 hours. Thyroid function studies Recent Labs    06/27/22 0036  TSH 1.910   Anemia work up No results for input(s): "VITAMINB12", "FOLATE", "FERRITIN", "TIBC", "IRON", "RETICCTPCT" in the last 72 hours. Urinalysis    Component Value Date/Time   COLORURINE COLORLESS (A) 06/26/2022 1240   APPEARANCEUR CLEAR 06/26/2022 1240   APPEARANCEUR Clear 02/24/2018 1009   LABSPEC 1.003 (L) 06/26/2022 1240  PHURINE 5.0 06/26/2022 1240   GLUCOSEU NEGATIVE 06/26/2022 1240   HGBUR MODERATE (A) 06/26/2022 1240   BILIRUBINUR NEGATIVE 06/26/2022 1240   BILIRUBINUR Negative 01/07/2022 0826   BILIRUBINUR Negative 02/24/2018 1009   KETONESUR 20 (A) 06/26/2022 1240   PROTEINUR NEGATIVE 06/26/2022 1240   UROBILINOGEN 0.2 01/07/2022 0826   NITRITE NEGATIVE 06/26/2022 1240   LEUKOCYTESUR NEGATIVE 06/26/2022 1240   Sepsis Labs Recent Labs  Lab 06/26/22 1216 06/27/22 0036 06/28/22 0542  WBC 12.6* 8.4 6.7   Microbiology No results found for this or any previous visit (from the past 240 hour(s)).   Time coordinating discharge: Over 30 minutes  SIGNED:   Cipriano Bunker, MD  [facility] 06/28/2022, 3:22 PM Pager   If 7PM-7AM, please contact night-coverage

## 2022-06-28 NOTE — Progress Notes (Signed)
Initial Nutrition Assessment  DOCUMENTATION CODES:   Not applicable  INTERVENTION:  Continue diet as ordered Ensure Enlive po BID, each supplement provides 350 kcal and 20 grams of protein. MVI with minerals daily Continue to monitor magnesium, potassium, and phosphorus BID for at least 3 days, MD to replete as needed, as pt is at risk for refeeding syndrome  NUTRITION DIAGNOSIS:   Inadequate oral intake related to other (see comment) (EtOH use) as evidenced by per patient/family report.  GOAL:   Patient will meet greater than or equal to 90% of their needs  MONITOR:   PO intake, Supplement acceptance, Labs, Weight trends  REASON FOR ASSESSMENT:     Assessment of nutrition requirement/status  ASSESSMENT:   Pt admitted with alcohol intoxication. PMH significant for EtOH use that is severe and ongoing  Unsuccessful attempt to reach pt via phone call to room. Unable to obtain detailed nutrition related history at this time.   Per history in chart review, pt had been drinking vodka for 3 days without food intake.   Meal completions: 9/16: 25% breakfast  Admit weight likely to be stated or pulled forward from prior weight in April. There is limited documentation of weight history to review within the last year although over the last 2 years her weight has trended down.   Unable to assess for malnutrition at this time given inability to perform nutrition focused physical exam however it is likely that pt has a degree of malnutrition.   Medications: folvite, MVI, klor con, thiamine  Labs: sodium 133, potassium 2.9, BUN <5, AST 69, ALT 55, tbili 1.6   NUTRITION - FOCUSED PHYSICAL EXAM: RD working remotely. Deferred to follow up.   Diet Order:   Diet Order             Diet regular Room service appropriate? Yes; Fluid consistency: Thin  Diet effective now                   EDUCATION NEEDS:   No education needs have been identified at this time  Skin:  Skin  Assessment: Reviewed RN Assessment  Last BM:  PTA  Height:   Ht Readings from Last 1 Encounters:  06/27/22 5\' 5"  (1.651 m)    Weight:   Wt Readings from Last 1 Encounters:  06/26/22 62.1 kg   BMI:  Body mass index is 22.8 kg/m.  Estimated Nutritional Needs:   Kcal:  1700-1900  Protein:  85-95g  Fluid:  >/=1.7L  Clayborne Dana, RDN, LDN Clinical Nutrition

## 2022-06-28 NOTE — Plan of Care (Signed)
°  Problem: Coping: °Goal: Level of anxiety will decrease °Outcome: Progressing °  °

## 2022-06-28 NOTE — Progress Notes (Signed)
Pt removed her own IV. Site WDL. Pt signed AMA papers and placed on shadow chart. When asking patient who would be picking her up from the hospital, she states "I'm going to go to Aaron's room in the ICU". Pt made aware that Marjory Lies, her boyfriend, was no longer a patient in the ICU. Pt became very agitated. RN asked patient if she knew Aaron's phone number. Pt unable to remember cell phone number. RN asked patient if there was anyone else that we could call. She states "NO! My mother and sister are too controlling. I don't want them knowing anything about me." RN had to access Aaron's chart, under discharged patients,  in order to get the correct cell phone number. RN dialed boyfriend's number for the patient. Patient and boyfriend arguing on the phone. Pt asking boyfriend to come and pick her up. Per pt, boyfriend states he will pick her up in 35 minutes. Pt wheeled to the main entrance with personal possessions and assisted to sit on the bench.

## 2022-06-28 NOTE — Progress Notes (Signed)
Pt required ativan throughout the entire night. Last CIWA score this am was 18. She was able to call and talk to her boyfriend at night and that gave her some peace. She was also able to consume jello and some ice pops but has verbalized she is hungry and would like to eat. Pt had mild nausea, no vomiting, and no reports of headache. Rn reassured the pt that the doctor will make rounds this am and update the patient with her plan if care once labs and results. She is eager to go home

## 2022-06-28 NOTE — Plan of Care (Signed)
Pt leaving hospital AMA.

## 2022-07-01 DIAGNOSIS — F10921 Alcohol use, unspecified with intoxication delirium: Secondary | ICD-10-CM | POA: Diagnosis not present

## 2022-07-01 DIAGNOSIS — R55 Syncope and collapse: Secondary | ICD-10-CM | POA: Diagnosis not present

## 2022-07-01 DIAGNOSIS — R4182 Altered mental status, unspecified: Secondary | ICD-10-CM | POA: Diagnosis not present

## 2022-07-01 DIAGNOSIS — Z3202 Encounter for pregnancy test, result negative: Secondary | ICD-10-CM | POA: Diagnosis not present

## 2022-07-01 DIAGNOSIS — I10 Essential (primary) hypertension: Secondary | ICD-10-CM | POA: Diagnosis not present

## 2022-07-01 DIAGNOSIS — R404 Transient alteration of awareness: Secondary | ICD-10-CM | POA: Diagnosis not present

## 2022-07-02 ENCOUNTER — Telehealth: Payer: Self-pay

## 2022-07-02 DIAGNOSIS — F419 Anxiety disorder, unspecified: Secondary | ICD-10-CM | POA: Diagnosis not present

## 2022-07-02 DIAGNOSIS — F1020 Alcohol dependence, uncomplicated: Secondary | ICD-10-CM | POA: Diagnosis not present

## 2022-07-02 NOTE — Patient Outreach (Signed)
  Care Coordination   07/02/2022 Name: Sherri Harris MRN: [id number] DOB: December 04, 1995   Care Coordination Outreach Attempts:  An unsuccessful telephone outreach was attempted today to offer the patient information about available care coordination services as a benefit of their health plan.   Follow Up Plan:  Additional outreach attempts will be made to offer the patient care coordination information and services.   Encounter Outcome:  No Answer  Care Coordination Interventions Activated:  No   Care Coordination Interventions:  No, not indicated    Noreene Larsson RN, MSN, [facility]  Mobile: [phone]

## 2022-07-10 ENCOUNTER — Ambulatory Visit: Payer: Self-pay

## 2022-07-10 NOTE — Telephone Encounter (Signed)
  Chief Complaint: Medication request. Symptoms: Withdrawal from alcohol Frequency: now Pertinent Negatives: Patient denies  Disposition: [] ED /[] Urgent Care (no appt availability in office) / [] Appointment(In office/virtual)/ []  [facility]/ [] Home Care/ [] Refused Recommended Disposition /[] [facility]/ [x]  Follow-up with PCP Additional Notes: Pt has been seen in ED 3 times for alcohol withdrawal over the past week. Pt is struggling with sobriety. Pt would like provider to call in Zofran and ativan. PT given numbers for [facility] and  [employer].PT agrees to go back to ED if needed and will call [facility] and or [employer] if needed. Pt states that she has spoken with provider regarding this before. Please advise .  Reason for Disposition  Unhealthy alcohol use, known or suspected  Answer Assessment - Initial Assessment Questions 1. ALCOHOL USE: "Do you drink alcohol, including beer, wine or hard liquor?"      2. HOW OFTEN: "How many days per week do you typically drink alcohol?"      3. HOW MUCH: "How many drinks do you typically have on days when you drink?" (1.5 oz hard liquor [one shot or jigger; 45 ml], 5 oz wine [small glass; 150 ml], 12 oz beer [one can; 360 ml])      4. MOST: "What is the most that you have had to drink on any one occasion in the last month?"      5. LAST 24 HOURS: "Have you had a drink within the last 24 hours?"      6. DRINKING PROBLEM: "Do you have or have you ever had an alcohol drinking problem?"      7. DRUG PROBLEM: "Are you using any other drugs?" (e.g., yes/no; cocaine, prescription medicines, etc.)      8. SYMPTOMS: "What symptoms are you currently experiencing?" (e.g., none, tremors, or shakiness, abdomen pain, vomiting, blackout spells)     none 9. DETOX PROGRAM: "Have you ever gone through a detox program?"     yes 10. THERAPIST: "Do you have a counselor or therapist? Name?"        11. SUPPORT: "Who is with you now?" "Who do you live  with?" "Do you have family or friends who you can talk to?" "Are you a member of Alcoholics Anonymous?"        12. PREGNANCY: "Is there any chance you are pregnant?" "When was your last menstrual period?"  Protocols used: Alcohol Use and Problems-A-AH

## 2022-07-11 ENCOUNTER — Ambulatory Visit: Payer: BC Managed Care – PPO | Admitting: Internal Medicine

## 2022-07-11 NOTE — Progress Notes (Deleted)
Subjective:    Patient ID: Sherri Harris, female    DOB: Jul 04, 1996, 26 y.o.   MRN: [id number]  HPI  Patient presents to clinic today for multiple ER visits and hospital follow-up.  She was seen 9/9, admitted 9/14 - 9/16, and 3/71 for alcoholic intoxication.  During these times, she was treated with IV fluids and the CIWA protocol.  She did have an elevated D-dimer but CTA chest was negative.  Chest x-ray was negative.  Labs revealed abnormal electrolytes which were replaced and elevated liver enzymes which had improved prior to discharge.  She is currently living with her boyfriend who is also an alcoholic.  She has been admitted multiple times for the same.  She has been to detox multiple times for the same.  We had tried her on Vivitrol injections but she was not consistent with these.  Review of Systems     Past Medical History:  Diagnosis Date   Anxiety    Hypertension     Current Outpatient Medications  Medication Sig Dispense Refill   busPIRone (BUSPAR) 5 MG tablet Take 5 mg by mouth 3 (three) times daily.     Naltrexone 380 MG SUSR Inject 380 mg into the muscle every 30 (thirty) days. (Patient not taking: Reported on 01/28/2022) 1.2 each 5   Norethindrone-Ethinyl Estradiol-Fe Biphas (LO LOESTRIN FE) 1 MG-10 MCG / 10 MCG tablet TAKE 1 TABLET BY MOUTH DAILY. (Patient not taking: Reported on 06/26/2022) 84 tablet 0   ondansetron (ZOFRAN) 4 MG tablet Take 1 tablet (4 mg total) by mouth every 6 (six) hours. (Patient not taking: Reported on 06/26/2022) 12 tablet 0   QUEtiapine (SEROQUEL) 100 MG tablet Take 1 tablet (100 mg total) by mouth at bedtime. 90 tablet 0   sertraline (ZOLOFT) 50 MG tablet Take 1 tablet (50 mg total) by mouth daily. 30 tablet 1   thiamine 100 MG tablet Take 1 tablet (100 mg total) by mouth every morning. 30 tablet 0   Current Facility-Administered Medications  Medication Dose Route Frequency Provider Last Rate Last Admin   Naltrexone SUSR 380 mg  380 mg  Intramuscular Q30 days [name], NP   380 mg at 03/05/22 1228   Naltrexone SUSR 380 mg  380 mg Intramuscular Q30 days Tema Alire W, NP   380 mg at 04/02/22 1621   Naltrexone SUSR 380 mg  380 mg Intramuscular Once Jerryl Holzhauer W, NP        Allergies  Allergen Reactions   Amoxicillin Rash    REACTION: rash   Penicillins Rash    Family History  Problem Relation Age of Onset   Hypertension Mother    Thyroid disease Mother    Hypertension Maternal Aunt    Hypertension Maternal Uncle    Alcohol abuse Maternal Uncle    Heart disease Maternal Grandmother    Cancer Maternal Grandmother        Breast   Hypertension Father    Cardiomyopathy Father    Alcohol abuse Maternal Grandfather    Alcohol abuse Paternal Uncle     Social History   Socioeconomic History   Marital status: Single    Spouse name: Not on file   Number of children: Not on file   Years of education: Not on file   Highest education level: Not on file  Occupational History   Not on file  Tobacco Use   Smoking status: Never   Smokeless tobacco: Never  Vaping Use   Vaping  Use: Never used  Substance and Sexual Activity   Alcohol use: Yes    Comment: bottle of wine/day    Drug use: No   Sexual activity: Yes    Birth control/protection: Pill  Other Topics Concern   Not on file  Social History Narrative   Not on file   Social Determinants of Health   Financial Resource Strain: Not on file  Food Insecurity: Not on file  Transportation Needs: Not on file  Physical Activity: Not on file  Stress: Not on file  Social Connections: Not on file  Intimate Partner Violence: Not on file     Constitutional: Denies fever, malaise, fatigue, headache or abrupt weight changes.  HEENT: Denies eye pain, eye redness, ear pain, ringing in the ears, wax buildup, runny nose, nasal congestion, bloody nose, or sore throat. Respiratory: Denies difficulty breathing, shortness of breath, cough or sputum production.    Cardiovascular: Denies chest pain, chest tightness, palpitations or swelling in the hands or feet.  Gastrointestinal: Denies abdominal pain, bloating, constipation, diarrhea or blood in the stool.  GU: Denies urgency, frequency, pain with urination, burning sensation, blood in urine, odor or discharge. Musculoskeletal: Denies decrease in range of motion, difficulty with gait, muscle pain or joint pain and swelling.  Skin: Denies redness, rashes, lesions or ulcercations.  Neurological: Denies dizziness, difficulty with memory, difficulty with speech or problems with balance and coordination.  Psych: Denies anxiety, depression, SI/HI.  No other specific complaints in a complete review of systems (except as listed in HPI above).  Objective:   Physical Exam  There were no vitals taken for this visit. Wt Readings from Last 3 Encounters:  06/26/22 137 lb (62.1 kg)  01/28/22 137 lb (62.1 kg)  01/13/22 139 lb (63 kg)    General: Appears their stated age, well developed, well nourished in NAD. Skin: Warm, dry and intact. No rashes, lesions or ulcerations noted. HEENT: Head: normal shape and size; Eyes: sclera white, no icterus, conjunctiva pink, PERRLA and EOMs intact; Ears: Tm's gray and intact, normal light reflex; Nose: mucosa pink and moist, septum midline; Throat/Mouth: Teeth present, mucosa pink and moist, no exudate, lesions or ulcerations noted.  Neck:  Neck supple, trachea midline. No masses, lumps or thyromegaly present.  Cardiovascular: Normal rate and rhythm. S1,S2 noted.  No murmur, rubs or gallops noted. No JVD or BLE edema. No carotid bruits noted. Pulmonary/Chest: Normal effort and positive vesicular breath sounds. No respiratory distress. No wheezes, rales or ronchi noted.  Abdomen: Soft and nontender. Normal bowel sounds. No distention or masses noted. Liver, spleen and kidneys non palpable. Musculoskeletal: Normal range of motion. No signs of joint swelling. No difficulty with  gait.  Neurological: Alert and oriented. Cranial nerves II-XII grossly intact. Coordination normal.  Psychiatric: Mood and affect normal. Behavior is normal. Judgment and thought content normal.    BMET    Component Value Date/Time   NA 133 (L) 06/28/2022 0542   NA 138 01/22/2018 1012   K 2.9 (L) 06/28/2022 0542   CL 96 (L) 06/28/2022 0542   CO2 30 06/28/2022 0542   GLUCOSE 110 (H) 06/28/2022 0542   BUN <5 (L) 06/28/2022 0542   BUN 9 01/22/2018 1012   CREATININE 0.50 06/28/2022 0542   CREATININE 0.75 01/28/2022 1108   CALCIUM 9.1 06/28/2022 0542   GFRNONAA >60 06/28/2022 0542   GFRAA >60 04/20/2020 1216    Lipid Panel     Component Value Date/Time   CHOL 189 01/28/2022 1108  TRIG 118 01/28/2022 1108   HDL 81 01/28/2022 1108   CHOLHDL 2.3 01/28/2022 1108   VLDL 32.0 07/18/2019 1535   LDLCALC 87 01/28/2022 1108    CBC    Component Value Date/Time   WBC 6.7 06/28/2022 0542   RBC 4.47 06/28/2022 0542   HGB 13.2 06/28/2022 0542   HGB 14.5 01/22/2018 1012   HCT 38.4 06/28/2022 0542   HCT 42.2 01/22/2018 1012   PLT 132 (L) 06/28/2022 0542   PLT 337 01/22/2018 1012   MCV 85.9 06/28/2022 0542   MCV 90 01/22/2018 1012   MCH 29.5 06/28/2022 0542   MCHC 34.4 06/28/2022 0542   RDW 12.4 06/28/2022 0542   RDW 12.4 01/22/2018 1012   LYMPHSABS 2,047 01/06/2022 1630   MONOABS 0.3 04/20/2020 1216   EOSABS 103 01/06/2022 1630   BASOSABS 163 01/06/2022 1630    Hgb A1C Lab Results  Component Value Date   HGBA1C 4.8 01/28/2022            Assessment & Plan:   ER/Hospital Follow Up for Alcohol Intoxication, Severe Alcohol Use Disorder:  ER notes, labs and imaging reviewed   RTC in 3 months for your annual exam Nicki Reaper, NP

## 2022-07-15 ENCOUNTER — Telehealth: Payer: BC Managed Care – PPO | Admitting: Internal Medicine

## 2022-07-16 ENCOUNTER — Ambulatory Visit (INDEPENDENT_AMBULATORY_CARE_PROVIDER_SITE_OTHER): Payer: BC Managed Care – PPO | Admitting: Internal Medicine

## 2022-07-16 ENCOUNTER — Encounter: Payer: Self-pay | Admitting: Internal Medicine

## 2022-07-16 VITALS — BP 126/82 | HR 106 | Temp 96.9°F | Wt 134.0 lb

## 2022-07-16 DIAGNOSIS — F1020 Alcohol dependence, uncomplicated: Secondary | ICD-10-CM | POA: Diagnosis not present

## 2022-07-16 DIAGNOSIS — F1010 Alcohol abuse, uncomplicated: Secondary | ICD-10-CM | POA: Diagnosis not present

## 2022-07-16 DIAGNOSIS — F1094 Alcohol use, unspecified with alcohol-induced mood disorder: Secondary | ICD-10-CM | POA: Diagnosis not present

## 2022-07-16 DIAGNOSIS — Z3009 Encounter for other general counseling and advice on contraception: Secondary | ICD-10-CM

## 2022-07-16 MED ORDER — LORAZEPAM 0.5 MG PO TABS: 0.5000 mg | ORAL_TABLET | Freq: Three times a day (TID) | ORAL | 0 refills | Status: DC | PRN

## 2022-07-16 MED ORDER — NORETHIN-ETH ESTRAD-FE BIPHAS 1 MG-10 MCG / 10 MCG PO TABS: 1.0000 | ORAL_TABLET | Freq: Every day | ORAL | 1 refills | Status: DC

## 2022-07-16 NOTE — Patient Instructions (Signed)
Alcohol Misuse and Dependence Information, Adult Alcohol is a widely available drug and people choose to drink alcohol in different amounts. Alcohol misuse and dependence can have a negative effect on your life. Alcohol misuse is when you use alcohol too much or too often. You may have a hard time setting a limit on the amount you drink. Alcohol dependence is when you use alcohol consistently for a period of time, and your body changes as a result. Alcohol dependence can make it hard for you to stop drinking because you may start to feel sick or different when you do not drink alcohol. These symptoms are known as withdrawal. People who drink alcohol very often and in large amounts, may develop what is called an alcohol use disorder. How can alcohol misuse and dependence affect me? Drinking too much can lead to addiction. You may feel like you need alcohol to function normally. You may drink alcohol before work in the morning, during the day, or as soon as you get home from work in the evening. These actions can result in: Poor work performance. Job loss. Financial problems. Car crashes or criminal charges from driving after drinking alcohol. Problems in your relationships with friends and family. Losing the trust and respect of coworkers, friends, and family. Drinking heavily over a long period of time can permanently damage your body and brain, and can cause lifelong health issues, such as: Damage to your liver or pancreas. Heart problems, high blood pressure, or stroke. Certain cancers. Decreased ability to fight infections. Brain or nerve damage. Depression. Early death, also called premature death. If you are careless or you crave alcohol, it is easy to drink more than your body can handle (overdose). Alcohol overdose is a serious situation that requires hospitalization. It may lead to permanent injuries or death. What can increase my risk? Having a family history of alcohol misuse. Having  depression or other mental health conditions. Beginning to drink at an early age. Binge drinking often. Experiencing trauma, stress, and an unstable home life during childhood. Spending time with people who drink often. What actions can I take to prevent alcohol misuse and dependence? Do not drink alcohol if: Your health care provider tells you not to drink. You are pregnant, may be pregnant, or are planning to become pregnant. If you drink alcohol: Limit how much you have to: 0-1 drink a day for women who are not pregnant. 0-2 drinks a day for men. Know how much alcohol is in your drink. In the [place] one drink equals one 12 oz bottle of beer (355 mL), one 5 oz glass of wine (148 mL), or one 1 oz glass of hard liquor (44 mL). If you think you have an alcohol dependency problem, decide to stop drinking. This can be very hard to do if you are used to frequently drinking alcohol. If you begin to have withdrawal symptoms, talk with your health care provider or a person that you trust. These symptoms may include anxiety, shaky hands, headache, nausea, sweating, or not being able to sleep. Choose to drink nonalcoholic beverages in social gatherings and places where there may be alcohol. Activity Spend more time on activities that you enjoy that do not involve alcohol, like hobbies or exercise. Find healthy ways to cope with stress, such as meditation or spending time with people you care about. General information Talk to your family, coworkers, and friends about supporting you in your efforts to stop drinking. If they drink, ask them not to drink   around you. Spend more time with people who do not drink alcohol. If you think that you have an alcohol dependency problem: Tell friends or family about your concerns. Talk with your health care provider or another health professional about where to get help. Work with a therapist and a chemical dependency counselor. Consider joining a support group  for people who struggle with alcohol misuse and dependence. Where to find support  Your health care provider. SMART Recovery: smartrecovery.org Local treatment centers or chemical dependency counselors. Local AA groups in your community: aa.org Where to find more information Centers for Disease Control and Prevention: cdc.gov National Institute on Alcohol Abuse and Alcoholism: niaaa.nih.gov Alcoholics Anonymous (AA): aa.org Contact a health care provider if: You drank more or for longer than you intended on more than one occasion. You often drink to the point of vomiting or passing out. You have problems in your life due to drinking, but you continue to drink. You keep drinking even though you feel anxious, depressed, or have experienced memory loss. You have stopped doing the things you used to enjoy in order to drink. You have to drink more than you used to in order to get the effect you want. You experience anxiety, sweating, nausea, shakiness, and trouble sleeping when you try to stop drinking. Get help right away if: You have serious withdrawal symptoms, including: Confusion. Racing heart. High blood pressure. Fever. These symptoms may be an emergency. Get help right away. Call 911. Do not wait to see if the symptoms will go away. Do not drive yourself to the hospital. Also, get help right away if: You have thoughts about hurting yourself or others. Take one of these steps if you feel like you may hurt yourself or others, or have thoughts about taking your own life: Call 911. Call the National Suicide Prevention Lifeline at 1-800-273-8255 or 988. This is open 24 hours a day. Text the Crisis Text Line at 741741. Summary Alcohol misuse and dependence can have a negative effect on your life. Drinking too much or too often can lead to addiction. If you drink alcohol, limit how much you use. If you are having trouble keeping your drinking under control, find ways to change your  behavior. Hobbies, calming activities, exercise, or support groups can help. If you feel you need help with changing your drinking habits, talk with your health care provider, a good friend, or a therapist, or go to a support group. This information is not intended to replace advice given to you by your health care provider. Make sure you discuss any questions you have with your health care provider. Document Revised: 12/04/2021 Document Reviewed: 12/04/2021 Elsevier Patient Education  2023 [employer]  
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
## 2022-07-19 LAB — COMPLETE METABOLIC PANEL WITH GFR
AG Ratio: 1.8 (calc) (ref 1.0–2.5)
ALT: 122 U/L — ABNORMAL HIGH (ref 6–29)
AST: 127 U/L — ABNORMAL HIGH (ref 10–30)
Albumin: 4.5 g/dL (ref 3.6–5.1)
Alkaline phosphatase (APISO): 75 U/L (ref 31–125)
BUN: 8 mg/dL (ref 7–25)
CO2: 24 mmol/L (ref 20–32)
Calcium: 9.5 mg/dL (ref 8.6–10.2)
Chloride: 100 mmol/L (ref 98–110)
Creat: 0.76 mg/dL (ref 0.50–0.96)
Globulin: 2.5 g/dL (calc) (ref 1.9–3.7)
Glucose, Bld: 90 mg/dL (ref 65–99)
Potassium: 4 mmol/L (ref 3.5–5.3)
Sodium: 137 mmol/L (ref 135–146)
Total Bilirubin: 0.9 mg/dL (ref 0.2–1.2)
Total Protein: 7 g/dL (ref 6.1–8.1)
eGFR: 110 mL/min/{1.73_m2} (ref >=60)

## 2022-07-19 LAB — FOLATE: Folate: 19.2 ng/mL

## 2022-07-19 LAB — VITAMIN B1: Vitamin B1 (Thiamine): 17 nmol/L (ref 8–30)

## 2022-07-19 LAB — VITAMIN B12: Vitamin B-12: 978 pg/mL (ref 200–1100)

## 2022-07-19 LAB — LIPASE: Lipase: 46 U/L (ref 7–60)

## 2022-07-28 DIAGNOSIS — F4323 Adjustment disorder with mixed anxiety and depressed mood: Secondary | ICD-10-CM | POA: Diagnosis not present

## 2022-08-19 ENCOUNTER — Encounter: Payer: Self-pay | Admitting: Internal Medicine

## 2022-09-13 DIAGNOSIS — Z818 Family history of other mental and behavioral disorders: Secondary | ICD-10-CM | POA: Diagnosis not present

## 2022-09-13 DIAGNOSIS — I10 Essential (primary) hypertension: Secondary | ICD-10-CM | POA: Diagnosis not present

## 2022-09-13 DIAGNOSIS — F419 Anxiety disorder, unspecified: Secondary | ICD-10-CM | POA: Diagnosis not present

## 2022-09-13 DIAGNOSIS — F10230 Alcohol dependence with withdrawal, uncomplicated: Secondary | ICD-10-CM | POA: Diagnosis not present

## 2022-09-13 DIAGNOSIS — Y907 Blood alcohol level of 200-239 mg/100 ml: Secondary | ICD-10-CM | POA: Diagnosis not present

## 2022-09-13 DIAGNOSIS — F10221 Alcohol dependence with intoxication delirium: Secondary | ICD-10-CM | POA: Diagnosis not present

## 2022-09-13 DIAGNOSIS — F411 Generalized anxiety disorder: Secondary | ICD-10-CM | POA: Diagnosis not present

## 2022-09-13 DIAGNOSIS — F17290 Nicotine dependence, other tobacco product, uncomplicated: Secondary | ICD-10-CM | POA: Diagnosis not present

## 2022-09-13 DIAGNOSIS — Z79899 Other long term (current) drug therapy: Secondary | ICD-10-CM | POA: Diagnosis not present

## 2022-09-13 DIAGNOSIS — R000 Tachycardia, unspecified: Secondary | ICD-10-CM | POA: Diagnosis not present

## 2022-09-13 DIAGNOSIS — F10231 Alcohol dependence with withdrawal delirium: Secondary | ICD-10-CM | POA: Diagnosis not present

## 2022-09-13 DIAGNOSIS — F10931 Alcohol use, unspecified with withdrawal delirium: Secondary | ICD-10-CM | POA: Diagnosis not present

## 2022-09-13 DIAGNOSIS — Z20822 Contact with and (suspected) exposure to covid-19: Secondary | ICD-10-CM | POA: Diagnosis not present

## 2022-09-13 DIAGNOSIS — F17210 Nicotine dependence, cigarettes, uncomplicated: Secondary | ICD-10-CM | POA: Diagnosis not present

## 2022-09-13 DIAGNOSIS — F332 Major depressive disorder, recurrent severe without psychotic features: Secondary | ICD-10-CM | POA: Diagnosis not present

## 2022-09-13 DIAGNOSIS — F1020 Alcohol dependence, uncomplicated: Secondary | ICD-10-CM | POA: Diagnosis not present

## 2022-09-13 DIAGNOSIS — F10239 Alcohol dependence with withdrawal, unspecified: Secondary | ICD-10-CM | POA: Diagnosis not present

## 2022-09-13 DIAGNOSIS — R9431 Abnormal electrocardiogram [ECG] [EKG]: Secondary | ICD-10-CM | POA: Diagnosis not present

## 2022-09-19 ENCOUNTER — Telehealth: Payer: Self-pay | Admitting: Internal Medicine

## 2022-09-19 NOTE — Telephone Encounter (Signed)
They were placed upfront yesterday.  Her mother was going to come pick these up.  I guess we can take them out and fax them instead.

## 2022-09-19 NOTE — Telephone Encounter (Signed)
Caller requesting forms faxed to pts employer  Fax [phone] attn: kendall payne

## 2022-09-19 NOTE — Telephone Encounter (Signed)
Okay I will get it and get it faxed over.

## 2022-09-23 ENCOUNTER — Encounter: Payer: Self-pay | Admitting: Internal Medicine

## 2022-09-23 ENCOUNTER — Telehealth (INDEPENDENT_AMBULATORY_CARE_PROVIDER_SITE_OTHER): Payer: BC Managed Care – PPO | Admitting: Internal Medicine

## 2022-09-23 DIAGNOSIS — F1020 Alcohol dependence, uncomplicated: Secondary | ICD-10-CM

## 2022-09-23 MED ORDER — LORAZEPAM 0.5 MG PO TABS: 0.5000 mg | ORAL_TABLET | Freq: Three times a day (TID) | ORAL | 0 refills | Status: DC | PRN

## 2022-09-23 MED ORDER — NALTREXONE 380 MG IM SUSR: 380.0000 mg | Freq: Once | INTRAMUSCULAR | 5 refills | Status: DC | PRN

## 2022-09-23 NOTE — Patient Instructions (Signed)
Binge-Drinking Information, Adult Binge-drinking means drinking a large amount of alcohol in a short time. This is usually 5 drinks for men or 4 drinks for women, on one occasion. People who binge-drink do not always have an alcohol problem. However, binge-drinking can raise your risk of becoming dependent on alcohol. Becoming dependent on alcohol is called alcohol use disorder. Binge drinking can: Cause health problems, such as heart disease, liver disease, or cancer. Cause problems between you and family members or friends. Lead to legal and financial problems. How can binge-drinking affect me? Friends and family may notice signs of binge-drinking or alcohol use disorder before you do. Binge-drinking can put you at risk for serious health problems, including: Accidental injuries, such as alcohol poisoning or falls. Developing alcohol use disorder. Violence, such as sexual assault. STIs (sexually transmitted infections). Mental health problems, such as anxiety or depression. Liver disease. Heart disease. Cancer of the liver, colon, esophagus, breast, or mouth. Binge-drinking can raise your risk of these problems: Car accidents. Problems with relationships and other social situations. Legal or financial problems. Unplanned pregnancies. If you binge-drink while pregnant, you and your baby may be at risk for health problems, including: Miscarriage. Stillbirth. Fetal alcohol spectrum disorders (FASDs). Sudden infant death syndrome (SIDS). What actions can I take to prevent binge-drinking?     Avoid drinking too much alcohol. If you drink alcohol: Limit how much you have to: 0-1 drink a day for women who are not pregnant. 0-2 drinks a day for men. Know how much alcohol is in a drink. In the [place] one drink equals one 12 oz bottle of beer (355 mL), one 5 oz glass of wine (148 mL), or one 1 oz glass of hard liquor (44 mL). Encourage others around you not to binge-drink. Become aware  of situations and people who trigger your drinking behavior, and either avoid those or find a new way to deal with them. Find hobbies that you can do instead of drinking, such as exercising or outdoor activities. Do not drink if: You are pregnant or trying to become pregnant. You plan to drive a vehicle. You plan to use machinery, such as a Surveyor, mining or power tool. You have a health condition that alcohol can make worse. You take medicines that are affected by alcohol, including prescription pain medicines. You are recovering from alcohol use disorder. Develop skills to manage your moods and emotions so you do not need to drink to cope with them. This may include using stress reduction techniques such as: Deep breathing. Meditation or yoga. Exercise or playing sports. Keeping a stress diary. Listening to music. What are the benefits of controlling my drinking? Controlling your drinking or quitting drinking can make you feel better about your life. It can also: Help you control your weight. Make you more likely to get into good physical shape and stay fit. Improve how your body processes vitamins and minerals. Improve your health by lowering your blood pressure, cholesterol, triglycerides, and blood sugar (glucose). Help you think more clearly and make better decisions. Where to find support: You can get support to stop binge-drinking from: Your health care provider. They may recommend counseling if you drink too much. The National Drug and Alcohol Treatment Referral Service: 1-800-662-HELP 810 512 2285) Alcoholics Anonymous, Alcoholics Resource Center: 340-520-0521 Substance Abuse and Mental Health Services Administration: RockToxic.pl Where to find more information: Centers for Disease Control and Prevention: TonerPromos.no [employer] on Alcohol Abuse and Alcoholism: PrintStats.es Contact a health care provider if: You cannot control  your drinking, or you think that your drinking  might be out of your control. You have unexpected physical problems that cause you distress, such as accidental injuries. Get help right away if: You have thoughts about hurting yourself or others. Get help right away if you feel like you may hurt yourself or others, or have thoughts about taking your own life. Call 911. Call the National Suicide Prevention Lifeline at (619)198-0811 or 988. This is open 24 hours a day. Text the Crisis Text Line at 380-830-0160. Summary Binge-drinking refers to drinking a large amount of alcohol in a short time. This is usually 5 drinks for men or 4 drinks for women, on one occasion. Binge-drinking can lead to serious health problems, such as heart disease, liver disease, or cancer. Binge-drinking raises your risk of developing alcohol dependence (alcohol use disorder) and social and relationship problems. Talk with your health care provider about your drinking habits. They may recommend counseling if you drink too much. This information is not intended to replace advice given to you by your health care provider. Make sure you discuss any questions you have with your health care provider. Document Revised: 12/16/2021 Document Reviewed: 12/16/2021 Elsevier Patient Education  2023 [employer].

## 2022-09-23 NOTE — Progress Notes (Signed)
Virtual Visit via Video Note  I connected with Sherri Harris on 09/23/22 at 11:20 AM EST by a video enabled telemedicine application and verified that I am speaking with the correct person using two identifiers.  Location: Patient: Home Provider: Office  Persons participating in this video call: Nicki Reaper, NP and Sherri Harris   I discussed the limitations of evaluation and management by telemedicine and the availability of in person appointments. The patient expressed understanding and agreed to proceed.  History of Present Illness:  Patient due for hospital follow-up.  She presented to [facility]'s 12/2 with complaint of alcohol intoxication requesting detox and rehab. On admission, she had a BAL of 230. She was started on the CIWA protocol, requiring Ativan intermittently. She was started on Trazadone as well but is not taking this in the outpatient setting. She did not want inpatient detox, was discharged on 12/5 with a referral to the [facility] for outpatient treatment although she has no plans to follow up here. Since that time, she has had a binge episode about 1 week ago. She has not had any alcohol since 12/9. She has been taking Ativan intermittently. She does plan to follow up with a therapist for intensive outpatient therapy.  She plans to go back to work on 12/18. She will need FMLA form completion for this.     Past Medical History:  Diagnosis Date   Anxiety    Hypertension     Current Outpatient Medications  Medication Sig Dispense Refill   LORazepam (ATIVAN) 0.5 MG tablet Take 1 tablet (0.5 mg total) by mouth every 8 (eight) hours as needed for anxiety. 15 tablet 0   Norethindrone-Ethinyl Estradiol-Fe Biphas (LO LOESTRIN FE) 1 MG-10 MCG / 10 MCG tablet TAKE 1 TABLET BY MOUTH DAILY. 84 tablet 1   ondansetron (ZOFRAN) 4 MG tablet Take 1 tablet (4 mg total) by mouth every 6 (six) hours. 12 tablet 0   No current facility-administered medications for this visit.     Allergies  Allergen Reactions   Amoxicillin Rash    REACTION: rash   Penicillins Rash    Family History  Problem Relation Age of Onset   Hypertension Mother    Thyroid disease Mother    Hypertension Maternal Aunt    Hypertension Maternal Uncle    Alcohol abuse Maternal Uncle    Heart disease Maternal Grandmother    Cancer Maternal Grandmother        Breast   Hypertension Father    Cardiomyopathy Father    Alcohol abuse Maternal Grandfather    Alcohol abuse Paternal Uncle     Social History   Socioeconomic History   Marital status: Single    Spouse name: Not on file   Number of children: Not on file   Years of education: Not on file   Highest education level: Not on file  Occupational History   Not on file  Tobacco Use   Smoking status: Never   Smokeless tobacco: Never  Vaping Use   Vaping Use: Never used  Substance and Sexual Activity   Alcohol use: Yes    Comment: bottle of wine/day    Drug use: No   Sexual activity: Yes    Birth control/protection: Pill  Other Topics Concern   Not on file  Social History Narrative   Not on file   Social Determinants of Health   Financial Resource Strain: Not on file  Food Insecurity: Not on file  Transportation Needs: Not on  file  Physical Activity: Not on file  Stress: Not on file  Social Connections: Not on file  Intimate Partner Violence: Not on file     Constitutional: Pt reports fatigue. Denies fever, malaise, headache or abrupt weight changes.  Respiratory: Denies difficulty breathing, shortness of breath, cough or sputum production.   Cardiovascular: Denies chest pain, chest tightness, palpitations or swelling in the hands or feet.  Gastrointestinal: Denies abdominal pain, bloating, constipation, diarrhea or blood in the stool.  Neurological: Pt reports insomnia. Denies dizziness, difficulty with memory, difficulty with speech or problems with balance and coordination.  Psych: Pt reports anxiety. Denies  depression, SI/HI.  No other specific complaints in a complete review of systems (except as listed in HPI above).  Observations/Objective:  Wt Readings from Last 3 Encounters:  07/16/22 134 lb (60.8 kg)  06/26/22 137 lb (62.1 kg)  01/28/22 137 lb (62.1 kg)    General: Appears her stated age, well developed, well nourished in NAD. Pulmonary/Chest: No respiratory distress.  Neurological: Alert and oriented.  Psychiatric: Anxious.    BMET    Component Value Date/Time   NA 137 07/16/2022 1108   NA 138 01/22/2018 1012   K 4.0 07/16/2022 1108   CL 100 07/16/2022 1108   CO2 24 07/16/2022 1108   GLUCOSE 90 07/16/2022 1108   BUN 8 07/16/2022 1108   BUN 9 01/22/2018 1012   CREATININE 0.76 07/16/2022 1108   CALCIUM 9.5 07/16/2022 1108   GFRNONAA >60 06/28/2022 0542   GFRAA >60 04/20/2020 1216    Lipid Panel     Component Value Date/Time   CHOL 189 01/28/2022 1108   TRIG 118 01/28/2022 1108   HDL 81 01/28/2022 1108   CHOLHDL 2.3 01/28/2022 1108   VLDL 32.0 07/18/2019 1535   LDLCALC 87 01/28/2022 1108    CBC    Component Value Date/Time   WBC 6.7 06/28/2022 0542   RBC 4.47 06/28/2022 0542   HGB 13.2 06/28/2022 0542   HGB 14.5 01/22/2018 1012   HCT 38.4 06/28/2022 0542   HCT 42.2 01/22/2018 1012   PLT 132 (L) 06/28/2022 0542   PLT 337 01/22/2018 1012   MCV 85.9 06/28/2022 0542   MCV 90 01/22/2018 1012   MCH 29.5 06/28/2022 0542   MCHC 34.4 06/28/2022 0542   RDW 12.4 06/28/2022 0542   RDW 12.4 01/22/2018 1012   LYMPHSABS 2,047 01/06/2022 1630   MONOABS 0.3 04/20/2020 1216   EOSABS 103 01/06/2022 1630   BASOSABS 163 01/06/2022 1630    Hgb A1C Lab Results  Component Value Date   HGBA1C 4.8 01/28/2022        Assessment and Plan:  Hospital Follow Up for Alcohol Intoxication:  Hospital notes reviewed Vivitrol refilled today Rx for Ativan 0.5 mg every 8 hours as needed for the next 5 to 7 days Encourage abstinence from alcohol Encouraged her to start  therapy for intensive outpatient treatment Will fill out FMLA form and get this faxed back to employer  RTC in 1 month for your annual exam  Follow Up Instructions:    I discussed the assessment and treatment plan with the patient. The patient was provided an opportunity to ask questions and all were answered. The patient agreed with the plan and demonstrated an understanding of the instructions.   The patient was advised to call back or seek an in-person evaluation if the symptoms worsen or if the condition fails to improve as anticipated.   Nicki Reaper, NP

## 2022-09-25 ENCOUNTER — Ambulatory Visit: Payer: Self-pay

## 2022-09-25 NOTE — Telephone Encounter (Signed)
  Marita Kansas from [facility] stated she needs clarification on Naltrexone 380 MG SUSR stated the directions say - Inject 380 mg into the muscle Once PRN for up to 1 dose. But five refills were sent. Marita Kansas stated that frequency and directions are needed for insurance billing.  Pharmacy is requesting medication clarification.   Callback- [phone]    . Pharmacy states they need to know if this a one time dose or monthly - 5 refills written. Also amount dispensed is 1.2, but it is usually 1 kit or 4 ml. They verification for insurance payment.  Answer Assessment - Initial Assessment Questions 1. NAME of MEDICINE: "What medicine(s) are you calling about?"     Naltrexone 2. QUESTION: "What is your question?" (e.g., double dose of medicine, side effect)     Pharmacy needs clarification - is this a one time dose or I x month. Amount dispensed states 1.2 but amount is usually 1 kit or 4 ml. 3. PRESCRIBER: "Who prescribed the medicine?" Reason: if prescribed by specialist, call should be referred to that group.     [employer] 4. SYMPTOMS: "Do you have any symptoms?" If Yes, ask: "What symptoms are you having?"  "How bad are the symptoms (e.g., mild, moderate, severe)     N/a 5. PREGNANCY:  "Is there any chance that you are pregnant?" "When was your last menstrual period?"     NO  Protocols used: Medication Question Call-A-AH

## 2022-09-26 ENCOUNTER — Telehealth: Payer: Self-pay | Admitting: Internal Medicine

## 2022-09-26 DIAGNOSIS — F1020 Alcohol dependence, uncomplicated: Secondary | ICD-10-CM | POA: Diagnosis not present

## 2022-09-26 NOTE — Telephone Encounter (Signed)
Clarified the rx with [facility] care.   Thank you,   -Vernona Rieger

## 2022-09-26 NOTE — Telephone Encounter (Signed)
Called and confirmed the delivery for [facility].   Thanks,   -Vernona Rieger

## 2022-09-26 NOTE — Telephone Encounter (Signed)
Sherri Harris from [facility] called in to confirm delivery for med, vivitrol. Please call back

## 2022-10-09 ENCOUNTER — Other Ambulatory Visit: Payer: Self-pay | Admitting: Family Medicine

## 2022-10-10 NOTE — Telephone Encounter (Signed)
Requested medication (s) are due for refill today: Yes  Requested medication (s) are on the active medication list: Yes  Last refill:  09/23/22  Future visit scheduled:No  Notes to clinic:  Unable to refill per protocol, medication not assigned to the refill protocol.      Requested Prescriptions  Pending Prescriptions Disp Refills   VIVITROL Re[place] [Pharmacy Med Name: VIVITROL SINGLE DOSE KIT 380MG/VL] 1 each 5    Sig: INJECT 380 MG INTRAMUSCULARLY EVERY MONTH. REFRIGERATE     Off-Protocol Failed - 10/09/2022  2:44 AM      Failed - Medication not assigned to a protocol, review manually.      Passed - Valid encounter within last 12 months    Recent Outpatient Visits           2 weeks ago Severe alcohol use disorder [facility])   [facility] [place], Coralie Keens, NP   2 months ago Severe alcohol use disorder [facility])   [facility] [place], Coralie Keens, NP   8 months ago Pure hypertriglyceridemia   [facility] [place], [place], [place]   9 months ago Subacromial bursitis of left shoulder joint   [facility] [place], [place], NP   9 months ago Alcohol use with alcohol-induced mood disorder [facility])   [facility] [place], Dani Gobble, [place]

## 2022-10-14 ENCOUNTER — Encounter: Payer: Self-pay | Admitting: Internal Medicine

## 2022-10-14 DIAGNOSIS — Z3009 Encounter for other general counseling and advice on contraception: Secondary | ICD-10-CM

## 2022-10-14 MED ORDER — NORETHIN-ETH ESTRAD-FE BIPHAS 1 MG-10 MCG / 10 MCG PO TABS: 1.0000 | ORAL_TABLET | Freq: Every day | ORAL | 1 refills | Status: DC

## 2022-10-16 DIAGNOSIS — F1020 Alcohol dependence, uncomplicated: Secondary | ICD-10-CM | POA: Diagnosis not present

## 2022-10-16 DIAGNOSIS — Y908 Blood alcohol level of 240 mg/100 ml or more: Secondary | ICD-10-CM | POA: Diagnosis not present

## 2022-10-16 DIAGNOSIS — Z20822 Contact with and (suspected) exposure to covid-19: Secondary | ICD-10-CM | POA: Diagnosis not present

## 2022-10-16 DIAGNOSIS — Z1152 Encounter for screening for COVID-19: Secondary | ICD-10-CM | POA: Diagnosis not present

## 2022-10-16 DIAGNOSIS — R000 Tachycardia, unspecified: Secondary | ICD-10-CM | POA: Diagnosis not present

## 2022-10-16 DIAGNOSIS — F1010 Alcohol abuse, uncomplicated: Secondary | ICD-10-CM | POA: Diagnosis not present

## 2022-10-16 DIAGNOSIS — R442 Other hallucinations: Secondary | ICD-10-CM | POA: Diagnosis not present

## 2022-10-16 DIAGNOSIS — Z3202 Encounter for pregnancy test, result negative: Secondary | ICD-10-CM | POA: Diagnosis not present

## 2022-10-16 DIAGNOSIS — T887XXA Unspecified adverse effect of drug or medicament, initial encounter: Secondary | ICD-10-CM | POA: Diagnosis not present

## 2022-10-16 DIAGNOSIS — T50904A Poisoning by unspecified drugs, medicaments and biological substances, undetermined, initial encounter: Secondary | ICD-10-CM | POA: Diagnosis not present

## 2022-10-17 DIAGNOSIS — Z3202 Encounter for pregnancy test, result negative: Secondary | ICD-10-CM | POA: Diagnosis not present

## 2022-10-17 DIAGNOSIS — F1020 Alcohol dependence, uncomplicated: Secondary | ICD-10-CM | POA: Insufficient documentation

## 2022-10-17 DIAGNOSIS — F1010 Alcohol abuse, uncomplicated: Secondary | ICD-10-CM | POA: Diagnosis not present

## 2022-10-17 DIAGNOSIS — Z20822 Contact with and (suspected) exposure to covid-19: Secondary | ICD-10-CM | POA: Diagnosis not present

## 2022-10-17 DIAGNOSIS — Z1152 Encounter for screening for COVID-19: Secondary | ICD-10-CM | POA: Insufficient documentation

## 2022-10-18 ENCOUNTER — Other Ambulatory Visit: Payer: Self-pay

## 2022-10-18 ENCOUNTER — Ambulatory Visit (HOSPITAL_COMMUNITY)
Admission: EM | Admit: 2022-10-18 | Discharge: 2022-10-18 | Disposition: A | Discharge status: Home / Self Care | Payer: BLUE CROSS/BLUE SHIELD | Attending: Urology | Admitting: Urology

## 2022-10-18 DIAGNOSIS — F1020 Alcohol dependence, uncomplicated: Secondary | ICD-10-CM

## 2022-10-18 DIAGNOSIS — Z1152 Encounter for screening for COVID-19: Secondary | ICD-10-CM | POA: Diagnosis not present

## 2022-10-18 LAB — COMPREHENSIVE METABOLIC PANEL
ALT: 89 U/L — ABNORMAL HIGH (ref 0–44)
AST: 102 U/L — ABNORMAL HIGH (ref 15–41)
Albumin: 4.6 g/dL (ref 3.5–5.0)
Alkaline Phosphatase: 65 U/L (ref 38–126)
Anion gap: 15 (ref 5–15)
BUN: 6 mg/dL (ref 6–20)
CO2: 25 mmol/L (ref 22–32)
Calcium: 9.2 mg/dL (ref 8.9–10.3)
Chloride: 102 mmol/L (ref 98–111)
Creatinine, Ser: 0.9 mg/dL (ref 0.44–1.00)
GFR, Estimated: >60 mL/min (ref >60)
Glucose, Bld: 83 mg/dL (ref 70–99)
Potassium: 4 mmol/L (ref 3.5–5.1)
Sodium: 142 mmol/L (ref 135–145)
Total Bilirubin: 0.7 mg/dL (ref 0.3–1.2)
Total Protein: 7.3 g/dL (ref 6.5–8.1)

## 2022-10-18 LAB — POCT URINE DRUG SCREEN - MANUAL ENTRY (I-SCREEN)
POC Amphetamine UR: NOT DETECTED
POC Buprenorphine (BUP): NOT DETECTED
POC Cocaine UR: NOT DETECTED
POC Marijuana UR: NOT DETECTED
POC Methadone UR: NOT DETECTED
POC Methamphetamine UR: NOT DETECTED
POC Morphine: NOT DETECTED
POC Oxazepam (BZO): POSITIVE — AB
POC Oxycodone UR: NOT DETECTED
POC Secobarbital (BAR): NOT DETECTED

## 2022-10-18 LAB — CBC WITH DIFFERENTIAL/PLATELET
Abs Immature Granulocytes: 0.02 10*3/uL (ref 0.00–0.07)
Basophils Absolute: 0.1 10*3/uL (ref 0.0–0.1)
Basophils Relative: 2 %
Eosinophils Absolute: 0 10*3/uL (ref 0.0–0.5)
Eosinophils Relative: 0 %
HCT: 43.1 % (ref 36.0–46.0)
Hemoglobin: 14.9 g/dL (ref 12.0–15.0)
Immature Granulocytes: 0 %
Lymphocytes Relative: 42 %
Lymphs Abs: 3.2 10*3/uL (ref 0.7–4.0)
MCH: 30.5 pg (ref 26.0–34.0)
MCHC: 34.6 g/dL (ref 30.0–36.0)
MCV: 88.1 fL (ref 80.0–100.0)
Monocytes Absolute: 0.4 10*3/uL (ref 0.1–1.0)
Monocytes Relative: 5 %
Neutro Abs: 4 10*3/uL (ref 1.7–7.7)
Neutrophils Relative %: 51 %
Platelets: 311 10*3/uL (ref 150–400)
RBC: 4.89 MIL/uL (ref 3.87–5.11)
RDW: 12.8 % (ref 11.5–15.5)
WBC: 7.7 10*3/uL (ref 4.0–10.5)
nRBC: 0 % (ref 0.0–0.2)

## 2022-10-18 LAB — TSH: TSH: 2.795 u[IU]/mL (ref 0.350–4.500)

## 2022-10-18 LAB — RESP PANEL BY RT-PCR (RSV, FLU A&B, COVID)  RVPGX2
Influenza A by PCR: NEGATIVE
Influenza B by PCR: NEGATIVE
Resp Syncytial Virus by PCR: NEGATIVE
SARS Coronavirus 2 by RT PCR: NEGATIVE

## 2022-10-18 LAB — LIPID PANEL
Cholesterol: 235 mg/dL — ABNORMAL HIGH (ref 0–200)
HDL: 125 mg/dL (ref >40)
LDL Cholesterol: 94 mg/dL (ref 0–99)
Total CHOL/HDL Ratio: 1.9 RATIO
Triglycerides: 78 mg/dL (ref <150)
VLDL: 16 mg/dL (ref 0–40)

## 2022-10-18 LAB — POC SARS CORONAVIRUS 2 AG: SARSCOV2ONAVIRUS 2 AG: NEGATIVE

## 2022-10-18 LAB — POC URINE PREG, ED: Preg Test, Ur: NEGATIVE

## 2022-10-18 LAB — ETHANOL: Alcohol, Ethyl (B): 352 mg/dL (ref <10)

## 2022-10-18 MED ORDER — LORAZEPAM 1 MG PO TABS
1.0000 mg | ORAL_TABLET | Freq: Four times a day (QID) | ORAL | Status: DC | PRN
  Administered 2022-10-18: 1 mg via ORAL
  Filled 2022-10-18: qty 1

## 2022-10-18 MED ORDER — ADULT MULTIVITAMIN W/MINERALS CH
1.0000 | ORAL_TABLET | Freq: Every day | ORAL | Status: DC
  Administered 2022-10-18: 1 via ORAL
  Filled 2022-10-18: qty 1

## 2022-10-18 MED ORDER — HYDROXYZINE HCL 25 MG PO TABS: 25.0000 mg | ORAL_TABLET | Freq: Four times a day (QID) | ORAL | Status: DC | PRN

## 2022-10-18 MED ORDER — THIAMINE MONONITRATE 100 MG PO TABS: 100.0000 mg | ORAL_TABLET | Freq: Every day | ORAL | Status: DC

## 2022-10-18 MED ORDER — MAGNESIUM HYDROXIDE 400 MG/5ML PO SUSP: 30.0000 mL | Freq: Every day | ORAL | Status: DC | PRN

## 2022-10-18 MED ORDER — THIAMINE HCL 100 MG/ML IJ SOLN
100.0000 mg | Freq: Once | INTRAMUSCULAR | Status: AC
  Administered 2022-10-18: 100 mg via INTRAMUSCULAR
  Filled 2022-10-18: qty 2

## 2022-10-18 MED ORDER — LOPERAMIDE HCL 2 MG PO CAPS: 2.0000 mg | ORAL_CAPSULE | ORAL | Status: DC | PRN

## 2022-10-18 MED ORDER — ONDANSETRON 4 MG PO TBDP: 4.0000 mg | ORAL_TABLET | Freq: Four times a day (QID) | ORAL | Status: DC | PRN

## 2022-10-18 MED ORDER — TRAZODONE HCL 50 MG PO TABS
50.0000 mg | ORAL_TABLET | Freq: Every evening | ORAL | Status: DC | PRN
  Administered 2022-10-18: 50 mg via ORAL
  Filled 2022-10-18 (×2): qty 1

## 2022-10-18 MED ORDER — ALUM & MAG HYDROXIDE-SIMETH 200-200-20 MG/5ML PO SUSP: 30.0000 mL | ORAL | Status: DC | PRN

## 2022-10-18 MED ORDER — ACETAMINOPHEN 325 MG PO TABS: 650.0000 mg | ORAL_TABLET | Freq: Four times a day (QID) | ORAL | Status: DC | PRN

## 2022-10-18 NOTE — ED Notes (Signed)
Discharge instructions provided and Pt stated understanding. Pt alert, orient and ambulatory prior to d/c from facility. No personal belongings to be returned from a locker. Safety maintained.

## 2022-10-18 NOTE — ED Provider Notes (Signed)
FBC/OBS ASAP Discharge Summary  Date and Time: 10/18/2022 1:49 PM  Name: Sherri Harris  MRN:  [id number]   Discharge Diagnoses:  Final diagnoses:  Alcohol use disorder, severe, dependence (HCC)    Subjective: Sherri Harris 27 y.o., female patient presented to [facility] as a walk in requesting alcohol detox.  She was admitted to the continuous assessment unit.  Sherri Harris, 27 y.o., female patient seen face to face by this provider, consulted with Dr. Lucianne Muss; and chart reviewed on 10/18/22.  Reports she has no psychiatric services in place.  She has a past psychiatric history of attending rehabilitation for alcohol use x 2 and reports over 10 hospital visits related to detox.  She was recommended for alcohol detox at [facility] yesterday but declined.  She has no documented history of withdrawal seizures or DTs.  Upon admission EtOH is 352 and UDS is positive for benzodiazepines.  On evaluation Sherri Harris reports she is endorsing alcohol withdrawal symptoms that include headache, fine visible hand tremor, stomach pain, and an increase in anxiety.  She expresses her frustration of being on the observation unit with other patients and not having a private room.  Discusses how the noise on the unit in the right lights are making her more anxious.  She is aware that her boyfriend was admitted to the [facility] because they came in together for treatment.  Explained that she could not be transferred to the [facility] due to her boyfriend being on the unit.  Discussed treatment options that included remaining in the observation unit and starting an Ativan taper for symptoms while we continue to search for an inpatient bed on the detox unit with other facilities.  Patient adamantly declined.  States she cannot handle being in that room and states, "if I stay in her room will flip the fuck out".  She is adamant that she wants to be discharged.  Explained that she would be leaving AGAINST MEDICAL ADVICE.   Patient verbalized understanding and continued to insist on being discharged.  Today's evaluation Sherri Harris is observed sitting in her bed in no acute distress.  She is alert/oriented x 4, cooperative, and attentive.  Her speech is clear, coherent, with a normal rate and tone.  She is disheveled and makes good eye contact.  She appears anxious and endorses an increase in her anxiety and depression.  She is denying SI/HI/AVH.  Objectively she does not appear to be responding to internal/external stimuli.  She does not appear psychotic or manic.  She is able to answer questions appropriately.  At this time Sherri Harris is educated and verbalizes understanding of mental health resources and other crisis services in the community. She is instructed to call 911 and present to the nearest emergency room should she experience any suicidal/homicidal ideation, auditory/visual/hallucinations, or detrimental worsening of her mental health condition.  She was a also advised by Clinical research associate that she could call the toll-free phone on back of  insurance card to assist with identifying in network counselors and agencies or number on back of insurance card to speak with care coordinator  Collateral: Mother, Lenox Ponds [phone]).  Contacted patient's mother with patient's permission.  Mother expresses frustration that patient continues to decline treatment.  Explained that treatment is being offered and patient could remain on the continuous assessment unit to complete detox while we search for an detox bed at another facility.  But patient is refusing.  Also explained that she  would be leaving AGAINST MEDICAL ADVICE. Mother verbalized understanding.  She will pick patient up upon discharge.  Social work provided multiple resources for alcohol detox and substance abuse treatment.  Stay Summary: Sherri Harris was admitted to [facility] Continuous Assessment unit for alcohol detox and crisis management.  She is  requesting to be discharged and she was offered further treatment options upon discharge including but not limited to Residential, Intensive Outpatient, Outpatient treatment, Rehabilitation services, and resources for shelters and Half-way-house if needed. She accepted resources but refused to remain on the unit.  She is discharging [place].  She agrees to follow up with the services as listed below under Follow up Information.     Upon completion of this admission the Sherri Harris was both mentally and medically stable for discharge denying suicidal/homicidal ideation, auditory/visual/tactile hallucinations, delusional thoughts and paranoia.    Total Time spent with patient: 30 minutes  Past Psychiatric History: see h&P Past Medical History:  Past Medical History:  Diagnosis Date   Anxiety    Hypertension     Past Surgical History:  Procedure Laterality Date   TYMPANOSTOMY TUBE PLACEMENT     WISDOM TOOTH EXTRACTION     Family History:  Family History  Problem Relation Age of Onset   Hypertension Mother    Thyroid disease Mother    Hypertension Maternal Aunt    Hypertension Maternal Uncle    Alcohol abuse Maternal Uncle    Heart disease Maternal Grandmother    Cancer Maternal Grandmother        Breast   Hypertension Father    Cardiomyopathy Father    Alcohol abuse Maternal Grandfather    Alcohol abuse Paternal Uncle    Family Psychiatric History: see H&P Social History:  Social History   Substance and Sexual Activity  Alcohol Use Yes   Comment: bottle of wine/day      Social History   Substance and Sexual Activity  Drug Use No    Social History   Socioeconomic History   Marital status: Single    Spouse name: Not on file   Number of children: Not on file   Years of education: Not on file   Highest education level: Not on file  Occupational History   Not on file  Tobacco Use   Smoking status: Never   Smokeless tobacco: Never  Vaping Use    Vaping Use: Never used  Substance and Sexual Activity   Alcohol use: Yes    Comment: bottle of wine/day    Drug use: No   Sexual activity: Yes    Birth control/protection: Pill  Other Topics Concern   Not on file  Social History Narrative   Not on file   Social Determinants of Health   Financial Resource Strain: Not on file  Food Insecurity: Not on file  Transportation Needs: Not on file  Physical Activity: Not on file  Stress: Not on file  Social Connections: Not on file   SDOH:  SDOH Screenings   Alcohol Screen: Medium Risk (01/06/2022)  Depression (PHQ2-9): High Risk (01/06/2022)  Tobacco Use: Low Risk  (09/23/2022)    Tobacco Cessation:  N/A, patient does not currently use tobacco products  Current Medications:  Current Facility-Administered Medications  Medication Dose Route Frequency Provider Last Rate Last Admin   acetaminophen (TYLENOL) tablet 650 mg  650 mg Oral Q6H PRN Ajibola, Ene A, NP       alum & mag hydroxide-simeth (MAALOX/MYLANTA) 200-200-20 MG/5ML suspension  30 mL  30 mL Oral Q4H PRN Ajibola, Ene A, NP       hydrOXYzine (ATARAX) tablet 25 mg  25 mg Oral Q6H PRN Ajibola, Ene A, NP       loperamide (IMODIUM) capsule 2-4 mg  2-4 mg Oral PRN Ajibola, Ene A, NP       LORazepam (ATIVAN) tablet 1 mg  1 mg Oral Q6H PRN Ajibola, Ene A, NP   1 mg at 10/18/22 0239   magnesium hydroxide (MILK OF MAGNESIA) suspension 30 mL  30 mL Oral Daily PRN Ajibola, Ene A, NP       multivitamin with minerals tablet 1 tablet  1 tablet Oral Daily Ajibola, Ene A, NP   1 tablet at 10/18/22 1001   ondansetron (ZOFRAN-ODT) disintegrating tablet 4 mg  4 mg Oral Q6H PRN Ajibola, Ene A, NP       [START ON 10/19/2022] thiamine (VITAMIN B1) tablet 100 mg  100 mg Oral Daily Ajibola, Ene A, NP       traZODone (DESYREL) tablet 50 mg  50 mg Oral QHS PRN Ajibola, Ene A, NP   50 mg at 10/18/22 0227   Current Outpatient Medications  Medication Sig Dispense Refill   LORazepam (ATIVAN) 0.5 MG tablet  Take 1 tablet (0.5 mg total) by mouth every 8 (eight) hours as needed for anxiety. 20 tablet 0   Norethindrone-Ethinyl Estradiol-Fe Biphas (LO LOESTRIN FE) 1 MG-10 MCG / 10 MCG tablet TAKE 1 TABLET BY MOUTH DAILY. 84 tablet 1   ondansetron (ZOFRAN) 4 MG tablet Take 1 tablet (4 mg total) by mouth every 6 (six) hours. 12 tablet 0   VIVITROL 380 MG SUSR INJECT 380 MG INTRAMUSCULARLY EVERY MONTH. REFRIGERATE 1 each 5    PTA Medications: (Not in a hospital admission)      01/06/2022    3:21 PM 11/16/2020    3:55 PM 11/29/2019    3:13 PM  Depression screen PHQ 2/9  Decreased Interest 2 0 1  Down, Depressed, Hopeless 3 0 2  PHQ - 2 Score 5 0 3  Altered sleeping 3 0 2  Tired, decreased energy 3 0 2  Change in appetite 3 0 2  Feeling bad or failure about yourself  2 0 1  Trouble concentrating 1 0 1  Moving slowly or fidgety/restless 1 0 0  Suicidal thoughts 0 0 0  PHQ-9 Score 18 0 11  Difficult doing work/chores Extremely dIfficult Not difficult at all Somewhat difficult    Flowsheet Row ED from 10/18/2022 in [facility] ED to Hosp-Admission (Discharged) from 06/26/2022 in [place] [facility] 4TH FLOOR [facility]GRESS[facility] UROLOGY ED from 06/13/2022 in [facility]   C-SSRS RISK CATEGORY No Risk No Risk No Risk       Musculoskeletal  Strength & Muscle Tone: within normal limits Gait & Station: normal Patient leans: N/A  Psychiatric Specialty Exam  Presentation  General Appearance:  Disheveled  Eye Contact: Good  Speech: Clear and Coherent; Normal Rate  Speech Volume: Normal  Handedness: Right   Mood and Affect  Mood: Depressed  Affect: Congruent   Thought Process  Thought Processes: Coherent  Descriptions of Associations:Intact  Orientation:Full (Time, Place and Person)  Thought Content:Logical  Diagnosis of Schizophrenia or Schizoaffective disorder in past: No    Hallucinations:Hallucinations: None  Ideas of  Reference:None  Suicidal Thoughts:Suicidal Thoughts: No  Homicidal Thoughts:Homicidal Thoughts: No   Sensorium  Memory: Immediate Good; Recent Good; Remote Good  Judgment: Poor  Insight: Fair   Chartered certified accountant: Good  Attention Span: Good  Recall: Dudley Major of Knowledge: Good  Language: Good   Psychomotor Activity  Psychomotor Activity: Psychomotor Activity: Normal   Assets  Assets: Leisure Time; Physical Health; Housing; Manufacturing systems engineer; Desire for Improvement; Financial Resources/Insurance   Sleep  Sleep: Sleep: Fair   Nutritional Assessment (For OBS and FBC admissions only) Has the patient had a weight loss or gain of 10 pounds or more in the last 3 months?: Yes Has the patient had a decrease in food intake/or appetite?: Yes Does the patient have dental problems?: No Does the patient have eating habits or behaviors that may be indicators of an eating disorder including binging or inducing vomiting?: No Has the patient recently lost weight without trying?: 2 Has the patient been eating poorly because of a decreased appetite?: 1 Malnutrition Screening Tool Score: 3 Nutritional Assessment Referrals: Refer to Social Work for [facility]    Physical Exam  Physical Exam Vitals and nursing note reviewed.  Constitutional:      General: She is not in acute distress.    Appearance: Normal appearance. She is not ill-appearing.  HENT:     Head: Normocephalic.  Eyes:     General:        Right eye: No discharge.        Left eye: No discharge.     Conjunctiva/sclera: Conjunctivae normal.     Pupils: Pupils are equal, round, and reactive to light.  Cardiovascular:     Rate and Rhythm: Normal rate.  Pulmonary:     Effort: Pulmonary effort is normal.  Musculoskeletal:        General: Normal range of motion.     Cervical back: Normal range of motion.  Skin:    General: Skin is warm and dry.  Neurological:     Mental  Status: She is alert and oriented to person, place, and time.  Psychiatric:        Attention and Perception: Attention and perception normal.        Mood and Affect: Affect normal. Mood is anxious and depressed.        Speech: Speech normal.        Behavior: Behavior normal.        Thought Content: Thought content normal.        Cognition and Memory: Cognition normal.        Judgment: Judgment is impulsive.    Review of Systems  Constitutional: Negative.   HENT: Negative.    Eyes: Negative.   Respiratory: Negative.    Cardiovascular: Negative.   Musculoskeletal: Negative.   Skin: Negative.   Neurological: Negative.   Psychiatric/Behavioral:  Positive for depression and substance abuse. The patient is nervous/anxious.    Blood pressure 110/73, pulse 86, temperature 98.1 F (36.7 C), temperature source Oral, resp. rate 16, SpO2 98 %. There is no height or weight on file to calculate BMI.  Demographic Factors:  Adolescent or young adult, Caucasian, Low socioeconomic status, and Unemployed  Loss Factors: Financial problems/change in socioeconomic status  Historical Factors: Impulsivity  Risk Reduction Factors:   Sense of responsibility to family, Living with another person, especially a relative, Positive social support, Positive therapeutic relationship, and Positive coping skills or problem solving skills  Continued Clinical Symptoms:  Severe Anxiety and/or Agitation Depression:   Comorbid alcohol abuse/dependence Impulsivity Alcohol/Substance Abuse/Dependencies  Cognitive Features That Contribute To Risk:  None    Suicide  Risk:  Minimal: No identifiable suicidal ideation.  Patients presenting with no risk factors but with morbid ruminations; may be classified as minimal risk based on the severity of the depressive symptoms  Plan Of Care/Follow-up recommendations:  Activity:  as tolerated  Diet:  regular   Disposition: Discharge patient-patient is leaving  AMA  Resources provided for substance abuse treatment which include detox, residential, and outpatient CD IOP.   Ardis Hughs, NP 10/18/2022, 1:49 PM

## 2022-10-18 NOTE — ED Notes (Signed)
Pt is in the bed sleeping. Respirations are even and unlabored. No acute distress noted. Will continue to monitor for safety. 

## 2022-10-18 NOTE — ED Provider Notes (Cosign Needed Addendum)
[facility] Continuous Assessment Admission H&P  Date: 10/19/22 Patient Name: Sherri Harris MRN: [id number] Chief Complaint:  Chief Complaint  Patient presents with   Alcohol Problem      Diagnoses:  Final diagnoses:  Alcohol use disorder, severe, dependence ([place])    HPI: History of Present illness: Sherri Harris is a 27 y.o. female with a history of alcohol use disorder, anxiety, alcoholic hepatitis,  and depression.  Patient presented to [facility] voluntarily due to alcohol abuse. Patient is accompanied by her boyfriend (who is also seeking treatment), mother, Mindi Slicker ([phone]), and her twin sister Kaylyn Lim [phone]).  Patient consented to her mother and sister participating in her assessment.  Patient was evaluated face-to-face and her chart was reviewed by this nurse practitioner.  On assessment, patient is alert and oriented x 4, able to follow directions. She appears to be intoxicated but was able to participate fully in assessment. Her speech is clear. Her mood is depressed, affect is congruent. No signs of mania, preoccupation, or delusional thought content present during assessment.   Patient endorses depressive symptoms of irritability, hopelessness, worthlessness, anxiety, isolation and fatigue. She says she has a history of alcohol abuse. She states "I'm going to lose my life to alcohol addiction, it's like a demon in my brain and I can't turn off the craving." She says she started drinking alcohol at age 73yrs old; she says alcohol abuse escalated when she turned 19 because she was able to legally purchase alcohol. She says she binge drink, and drinks approximately 1 pint of vodka daily during binge. She says her last alcoholic drink was on 24/40/1027. She reports that she has been to rehab twice and over 10 hospital visits for detox. She reports she was evaluated at [facility] yesterday for alcohol abuse and recommended for rehab but declined  treatment. She reports drinking alcohol after discharge to the point of intoxication. She says she wants to get sober so she can work and be a "functional adult." She says she typically gets Vivitrol injections to help with sobriety. She says longest sobriety was 3 months while on Vivitrol. She denies history of alcohol withdrawal seizures or DTs. She denies suicidal ideation, homicidal ideation, paranoia, hallucination, and other substance use.  Blood alcohol level is 352 UDS + for Benzo   Patient's sister and mother voiced concern for patient due to alcohol abuse. The says patient drinks daily to the point of black outs. The report patient has been to rehab/detox on several occasion but continues to relapse. They report 6 months ago, patient was unresponsive due to alcohol intoxication and was intubated. They would like patient to go to a long term substance abuse program after detox.    PHQ 2-9:  [place] Visit from 01/06/2022 in [facility] Office Visit from 11/16/2020 in [place] at [facility] Visit from 11/29/2019 in [place] at [facility] that you would be better off dead, or of hurting yourself in some way Not at all Not at all Not at all  PHQ-9 Total Score 18 0 11       [place] ED from 10/18/2022 in [facility] ED to Hosp-Admission (Discharged) from 06/26/2022 in [place] ED from 06/13/2022 in [place] [facility] at [facility] No Risk No Risk No Risk        Total Time spent with  patient: 30 minutes  Musculoskeletal  Strength & Muscle Tone: within normal limits Gait & Station: normal Patient leans: Right  Psychiatric Specialty Exam  Presentation General Appearance:  Disheveled  Eye Contact: Good  Speech: Clear and Coherent; Normal Rate  Speech Volume: Normal  Handedness: Right   Mood and Affect   Mood: Depressed  Affect: Congruent   Thought Process  Thought Processes: Coherent  Descriptions of Associations:Intact  Orientation:Full (Time, Place and Person)  Thought Content:Logical  Diagnosis of Schizophrenia or Schizoaffective disorder in past: No   Hallucinations:Hallucinations: None  Ideas of Reference:None  Suicidal Thoughts:Suicidal Thoughts: No  Homicidal Thoughts:Homicidal Thoughts: No   Sensorium  Memory: Immediate Good; Recent Good; Remote Good  Judgment: Poor  Insight: Fair   Art therapist  Concentration: Good  Attention Span: Good  Recall: Good  Fund of Knowledge: Good  Language: Good   Psychomotor Activity  Psychomotor Activity: Psychomotor Activity: Normal   Assets  Assets: Leisure Time; Physical Health; Housing; Manufacturing systems engineer; Desire for Improvement; Financial Resources/Insurance   Sleep  Sleep: Sleep: Fair   Nutritional Assessment (For OBS and FBC admissions only) Has the patient had a weight loss or gain of 10 pounds or more in the last 3 months?: Yes Has the patient had a decrease in food intake/or appetite?: Yes Does the patient have dental problems?: No Does the patient have eating habits or behaviors that may be indicators of an eating disorder including binging or inducing vomiting?: No Has the patient recently lost weight without trying?: 2 Has the patient been eating poorly because of a decreased appetite?: 1 Malnutrition Screening Tool Score: 3 Nutritional Assessment Referrals: Refer to Social Work for [facility]    Physical Exam Vitals and nursing note reviewed.  Constitutional:      General: She is not in acute distress.    Appearance: She is well-developed.  HENT:     Head: Normocephalic and atraumatic.  Eyes:     Conjunctiva/sclera: Conjunctivae normal.  Cardiovascular:     Rate and Rhythm: Tachycardia present.  Pulmonary:     Effort: Pulmonary effort is normal. No  respiratory distress.  Abdominal:     Palpations: Abdomen is soft.     Tenderness: There is no abdominal tenderness.  Musculoskeletal:        General: No swelling.     Cervical back: Neck supple.  Skin:    General: Skin is warm and dry.     Capillary Refill: Capillary refill takes less than 2 seconds.  Neurological:     Mental Status: She is alert and oriented to person, place, and time.  Psychiatric:        Attention and Perception: Attention and perception normal.        Mood and Affect: Mood is anxious and depressed. Affect is tearful.        Speech: Speech normal.        Behavior: Behavior normal. Behavior is cooperative.        Thought Content: Thought content normal.    Review of Systems  Constitutional: Negative.   HENT: Negative.    Eyes: Negative.   Respiratory: Negative.    Cardiovascular: Negative.   Gastrointestinal: Negative.   Genitourinary: Negative.   Musculoskeletal: Negative.   Skin: Negative.   Neurological: Negative.   Endo/Heme/Allergies: Negative.   Psychiatric/Behavioral:  Positive for depression and substance abuse. The patient is nervous/anxious.     Blood pressure 110/73, pulse 86, temperature 98.1 F (36.7 C), temperature source Oral, resp. rate 16,  SpO2 98 %. There is no height or weight on file to calculate BMI.  Past Psychiatric History: Alcohol abuse   Is the patient at risk to self? No  Has the patient been a risk to self in the past 6 months? No .    Has the patient been a risk to self within the distant past? No   Is the patient a risk to others? No   Has the patient been a risk to others in the past 6 months? No   Has the patient been a risk to others within the distant past? No   Past Medical History:  Past Medical History:  Diagnosis Date   Anxiety    Hypertension     Past Surgical History:  Procedure Laterality Date   TYMPANOSTOMY TUBE PLACEMENT     WISDOM TOOTH EXTRACTION      Family History:  Family History  Problem  Relation Age of Onset   Hypertension Mother    Thyroid disease Mother    Hypertension Maternal Aunt    Hypertension Maternal Uncle    Alcohol abuse Maternal Uncle    Heart disease Maternal Grandmother    Cancer Maternal Grandmother        Breast   Hypertension Father    Cardiomyopathy Father    Alcohol abuse Maternal Grandfather    Alcohol abuse Paternal Uncle     Social History:  Social History   Socioeconomic History   Marital status: Single    Spouse name: Not on file   Number of children: Not on file   Years of education: Not on file   Highest education level: Not on file  Occupational History   Not on file  Tobacco Use   Smoking status: Never   Smokeless tobacco: Never  Vaping Use   Vaping Use: Never used  Substance and Sexual Activity   Alcohol use: Yes    Comment: bottle of wine/day    Drug use: No   Sexual activity: Yes    Birth control/protection: Pill  Other Topics Concern   Not on file  Social History Narrative   Not on file   Social Determinants of Health   Financial Resource Strain: Not on file  Food Insecurity: Not on file  Transportation Needs: Not on file  Physical Activity: Not on file  Stress: Not on file  Social Connections: Not on file  Intimate Partner Violence: Not on file    SDOH:  SDOH Screenings   Alcohol Screen: Medium Risk (01/06/2022)  Depression (PHQ2-9): High Risk (01/06/2022)  Tobacco Use: Low Risk  (09/23/2022)    Last Labs:  Admission on 10/18/2022, Discharged on 10/18/2022  Component Date Value Ref Range Status   SARS Coronavirus 2 by RT PCR 10/18/2022 NEGATIVE  NEGATIVE Final   Comment: (NOTE) SARS-CoV-2 target nucleic acids are NOT DETECTED.  The SARS-CoV-2 RNA is generally detectable in upper respiratory specimens during the acute phase of infection. The lowest concentration of SARS-CoV-2 viral copies this assay can detect is 138 copies/mL. A negative result does not preclude SARS-Cov-2 infection and should not  be used as the sole basis for treatment or other patient management decisions. A negative result may occur with  improper specimen collection/handling, submission of specimen other than nasopharyngeal swab, presence of viral mutation(s) within the areas targeted by this assay, and inadequate number of viral copies(<138 copies/mL). A negative result must be combined with clinical observations, patient history, and epidemiological information. The expected result is Negative.  Fact Sheet  for Patients:  BloggerCourse.com  Fact Sheet for Healthcare Providers:  SeriousBroker.it  This test is no                          t yet approved or cleared by the [place] FDA and  has been authorized for detection and/or diagnosis of SARS-CoV-2 by FDA under an Emergency Use Authorization (EUA). This EUA will remain  in effect (meaning this test can be used) for the duration of the COVID-19 declaration under Section 564(b)(1) of the Act, 21 U.S.C.section 360bbb-3(b)(1), unless the authorization is terminated  or revoked sooner.       Influenza A by PCR 10/18/2022 NEGATIVE  NEGATIVE Final   Influenza B by PCR 10/18/2022 NEGATIVE  NEGATIVE Final   Comment: (NOTE) The Xpert Xpress SARS-CoV-2/FLU/RSV plus assay is intended as an aid in the diagnosis of influenza from Nasopharyngeal swab specimens and should not be used as a sole basis for treatment. Nasal washings and aspirates are unacceptable for Xpert Xpress SARS-CoV-2/FLU/RSV testing.  Fact Sheet for Patients: BloggerCourse.com  Fact Sheet for Healthcare Providers: SeriousBroker.it  This test is not yet approved or cleared by the [place] FDA and has been authorized for detection and/or diagnosis of SARS-CoV-2 by FDA under an Emergency Use Authorization (EUA). This EUA will remain in effect (meaning this test can be used) for the  duration of the COVID-19 declaration under Section 564(b)(1) of the Act, 21 U.S.C. section 360bbb-3(b)(1), unless the authorization is terminated or revoked.     Resp Syncytial Virus by PCR 10/18/2022 NEGATIVE  NEGATIVE Final   Comment: (NOTE) Fact Sheet for Patients: BloggerCourse.com  Fact Sheet for Healthcare Providers: SeriousBroker.it  This test is not yet approved or cleared by the [place] FDA and has been authorized for detection and/or diagnosis of SARS-CoV-2 by FDA under an Emergency Use Authorization (EUA). This EUA will remain in effect (meaning this test can be used) for the duration of the COVID-19 declaration under Section 564(b)(1) of the Act, 21 U.S.C. section 360bbb-3(b)(1), unless the authorization is terminated or revoked.  Performed at [facility], [address], [place], [place] [address]    WBC 10/18/2022 7.7  4.0 - 10.5 K/uL Final   RBC 10/18/2022 4.89  3.87 - 5.11 MIL/uL Final   Hemoglobin 10/18/2022 14.9  12.0 - 15.0 g/dL Final   HCT 19/14/7829 43.1  36.0 - 46.0 % Final   MCV 10/18/2022 88.1  80.0 - 100.0 fL Final   MCH 10/18/2022 30.5  26.0 - 34.0 pg Final   MCHC 10/18/2022 34.6  30.0 - 36.0 g/dL Final   RDW 56/21/3086 12.8  11.5 - 15.5 % Final   Platelets 10/18/2022 311  150 - 400 K/uL Final   nRBC 10/18/2022 0.0  0.0 - 0.2 % Final   Neutrophils Relative % 10/18/2022 51  % Final   Neutro Abs 10/18/2022 4.0  1.7 - 7.7 K/uL Final   Lymphocytes Relative 10/18/2022 42  % Final   Lymphs Abs 10/18/2022 3.2  0.7 - 4.0 K/uL Final   Monocytes Relative 10/18/2022 5  % Final   Monocytes Absolute 10/18/2022 0.4  0.1 - 1.0 K/uL Final   Eosinophils Relative 10/18/2022 0  % Final   Eosinophils Absolute 10/18/2022 0.0  0.0 - 0.5 K/uL Final   Basophils Relative 10/18/2022 2  % Final   Basophils Absolute 10/18/2022 0.1  0.0 - 0.1 K/uL Final   Immature Granulocytes 10/18/2022 0  %  Final   Abs Immature  Granulocytes 10/18/2022 0.02  0.00 - 0.07 K/uL Final   Performed at [facility], [address] [place], [place] [address]   Sodium 10/18/2022 142  135 - 145 mmol/L Final   Potassium 10/18/2022 4.0  3.5 - 5.1 mmol/L Final   Chloride 10/18/2022 102  98 - 111 mmol/L Final   CO2 10/18/2022 25  22 - 32 mmol/L Final   Glucose, Bld 10/18/2022 83  70 - 99 mg/dL Final   Glucose reference range applies only to samples taken after fasting for at least 8 hours.   BUN 10/18/2022 6  6 - 20 mg/dL Final   Creatinine, Ser 10/18/2022 0.90  0.44 - 1.00 mg/dL Final   Calcium 47/06/6282 9.2  8.9 - 10.3 mg/dL Final   Total Protein 66/29/4765 7.3  6.5 - 8.1 g/dL Final   Albumin 46/50/3546 4.6  3.5 - 5.0 g/dL Final   AST 56/81/2751 102 (H)  15 - 41 U/L Final   ALT 10/18/2022 89 (H)  0 - 44 U/L Final   Alkaline Phosphatase 10/18/2022 65  38 - 126 U/L Final   Total Bilirubin 10/18/2022 0.7  0.3 - 1.2 mg/dL Final   GFR, Estimated 10/18/2022 >60  >60 mL/min Final   Comment: (NOTE) Calculated using the CKD-EPI Creatinine Equation (2021)    Anion gap 10/18/2022 15  5 - 15 Final   Performed at [facility], [address] [place], [place] [address]   Alcohol, Ethyl (B) 10/18/2022 352 (HH)  <10 mg/dL Final   Comment: CRITICAL RESULT CALLED TO, READ BACK BY AND VERIFIED WITH EUNICE OFORI RN 10/18/22 0407 M KOROLESKI (NOTE) Lowest detectable limit for serum alcohol is 10 mg/dL.  For medical purposes only. Performed at [facility], [address] [place], [place] [address]    TSH 10/18/2022 2.795  0.350 - 4.500 uIU/mL Final   Comment: Performed by a 3rd Generation assay with a functional sensitivity of <=0.01 uIU/mL. Performed at [facility], [address] [place], [place] [address]    Cholesterol 10/18/2022 235 (H)  0 - 200 mg/dL Final   Triglycerides 63/84/6659 78  <150 mg/dL Final   HDL 93/57/0177 125  >40 mg/dL Final   Total CHOL/HDL Ratio 10/18/2022 1.9  RATIO Final   VLDL  10/18/2022 16  0 - 40 mg/dL Final   LDL Cholesterol 10/18/2022 94  0 - 99 mg/dL Final   Comment:        Total Cholesterol/HDL:CHD Risk Coronary Heart Disease Risk Table                     Men   Women  1/2 Average Risk   3.4   3.3  Average Risk       5.0   4.4  2 X Average Risk   9.6   7.1  3 X Average Risk  23.4   11.0        Use the calculated Patient Ratio above and the CHD Risk Table to determine the patient's CHD Risk.        ATP III CLASSIFICATION (LDL):  <100     mg/dL   Optimal  939-030  mg/dL   Near or Above                    Optimal  130-159  mg/dL   Borderline  092-330  mg/dL   High  >076  mg/dL   Very High Performed at [facility], [address] [place], [place] [address]    POC Amphetamine UR 10/18/2022 None Detected  NONE DETECTED (Cut Off Level 1000 ng/mL) Preliminary   POC Secobarbital (BAR) 10/18/2022 None Detected  NONE DETECTED (Cut Off Level 300 ng/mL) Preliminary   POC Buprenorphine (BUP) 10/18/2022 None Detected  NONE DETECTED (Cut Off Level 10 ng/mL) Preliminary   POC Oxazepam (BZO) 10/18/2022 Positive (A)  NONE DETECTED (Cut Off Level 300 ng/mL) Preliminary   POC Cocaine UR 10/18/2022 None Detected  NONE DETECTED (Cut Off Level 300 ng/mL) Preliminary   POC Methamphetamine UR 10/18/2022 None Detected  NONE DETECTED (Cut Off Level 1000 ng/mL) Preliminary   POC Morphine 10/18/2022 None Detected  NONE DETECTED (Cut Off Level 300 ng/mL) Preliminary   POC Methadone UR 10/18/2022 None Detected  NONE DETECTED (Cut Off Level 300 ng/mL) Preliminary   POC Oxycodone UR 10/18/2022 None Detected  NONE DETECTED (Cut Off Level 100 ng/mL) Preliminary   POC Marijuana UR 10/18/2022 None Detected  NONE DETECTED (Cut Off Level 50 ng/mL) Preliminary   Preg Test, Ur 10/18/2022 Negative  Negative Preliminary   SARSCOV2ONAVIRUS 2 AG 10/18/2022 NEGATIVE  NEGATIVE Final   Comment: (NOTE) SARS-CoV-2 antigen NOT DETECTED.   Negative results are presumptive.  Negative  results do not preclude SARS-CoV-2 infection and should not be used as the sole basis for treatment or other patient management decisions, including infection  control decisions, particularly in the presence of clinical signs and  symptoms consistent with COVID-19, or in those who have been in contact with the virus.  Negative results must be combined with clinical observations, patient history, and epidemiological information. The expected result is Negative.  Fact Sheet for Patients: https://www.jennings-kim.com/  Fact Sheet for Healthcare Providers: https://alexander-rogers.biz/  This test is not yet approved or cleared by the [place] FDA and  has been authorized for detection and/or diagnosis of SARS-CoV-2 by FDA under an Emergency Use Authorization (EUA).  This EUA will remain in effect (meaning this test can be used) for the duration of  the COV                          ID-19 declaration under Section 564(b)(1) of the Act, 21 U.S.C. section 360bbb-3(b)(1), unless the authorization is terminated or revoked sooner.    Office Visit on 07/16/2022  Component Date Value Ref Range Status   Glucose, Bld 07/16/2022 90  65 - 99 mg/dL Final   Comment: .            Fasting reference interval .    BUN 07/16/2022 8  7 - 25 mg/dL Final   Creat 19/37/9024 0.76  0.50 - 0.96 mg/dL Final   eGFR 09/73/5329 110  > OR = 60 mL/min/1.79m2 Final   BUN/Creatinine Ratio 07/16/2022 SEE NOTE:  6 - 22 (calc) Final   Comment:    Not Reported: BUN and Creatinine are within    reference range. .    Sodium 07/16/2022 137  135 - 146 mmol/L Final   Potassium 07/16/2022 4.0  3.5 - 5.3 mmol/L Final   Chloride 07/16/2022 100  98 - 110 mmol/L Final   CO2 07/16/2022 24  20 - 32 mmol/L Final   Calcium 07/16/2022 9.5  8.6 - 10.2 mg/dL Final   Total Protein 92/42/6834 7.0  6.1 - 8.1 g/dL Final   Albumin 19/62/2297 4.5  3.6 - 5.1 g/dL Final   Globulin  07/16/2022 2.5  1.9 - 3.7  g/dL (calc) Final   AG Ratio 07/16/2022 1.8  1.0 - 2.5 (calc) Final   Total Bilirubin 07/16/2022 0.9  0.2 - 1.2 mg/dL Final   Alkaline phosphatase (APISO) 07/16/2022 75  31 - 125 U/L Final   AST 07/16/2022 127 (H)  10 - 30 U/L Final   ALT 07/16/2022 122 (H)  6 - 29 U/L Final   Lipase 07/16/2022 46  7 - 60 U/L Final   Vitamin B-12 07/16/2022 978  200 - 1,100 pg/mL Final   Vitamin B1 (Thiamine) 07/16/2022 17  8 - 30 nmol/L Final   Comment: Marland Kitchen. Vitamin supplementation within 24 hours prior to blood draw may affect the accuracy of the results. . This test was developed and its analytical performance characteristics have been determined by [facility] [place], [place]. It has not been cleared or approved by the U.S. Food and Drug Administration. This assay has been validated pursuant to the CLIA regulations and is used for clinical purposes. .    Folate 07/16/2022 19.2  ng/mL Final   Comment:                            Reference Range                            Low:           <3.4                            Borderline:    3.4-5.4                            Normal:        >5.4 .   Admission on 06/26/2022, Discharged on 06/28/2022  Component Date Value Ref Range Status   Sodium 06/26/2022 135  135 - 145 mmol/L Final   ELECTROLYTES REPEATED TO VERIFY   Potassium 06/26/2022 3.8  3.5 - 5.1 mmol/L Final   ELECTROLYTES REPEATED TO VERIFY   Chloride 06/26/2022 91 (L)  98 - 111 mmol/L Final   ELECTROLYTES REPEATED TO VERIFY   CO2 06/26/2022 17 (L)  22 - 32 mmol/L Final   ELECTROLYTES REPEATED TO VERIFY   Glucose, Bld 06/26/2022 81  70 - 99 mg/dL Final   Glucose reference range applies only to samples taken after fasting for at least 8 hours.   BUN 06/26/2022 14  6 - 20 mg/dL Final   Creatinine, Ser 06/26/2022 0.72  0.44 - 1.00 mg/dL Final   Calcium 16/10/960409/14/2023 8.6 (L)  8.9 - 10.3 mg/dL Final   ELECTROLYTES REPEATED TO VERIFY   Total Protein 06/26/2022 8.0  6.5 - 8.1  g/dL Final   Albumin 54/09/811909/14/2023 4.3  3.5 - 5.0 g/dL Final   AST 14/78/295609/14/2023 138 (H)  15 - 41 U/L Final   ALT 06/26/2022 76 (H)  0 - 44 U/L Final   Alkaline Phosphatase 06/26/2022 61  38 - 126 U/L Final   Total Bilirubin 06/26/2022 1.9 (H)  0.3 - 1.2 mg/dL Final   GFR, Estimated 06/26/2022 >60  >60 mL/min Final   Comment: (NOTE) Calculated using the CKD-EPI Creatinine Equation (2021)    Anion gap 06/26/2022 27 (H)  5 - 15 Final   Performed at [facility], [address] [place], [place] [address]   Alcohol, Ethyl (B) 06/26/2022 578 (HH)  <10 mg/dL Final   Comment: CRITICAL RESULT CALLED TO, READ BACK BY AND VERIFIED WITH WOODY, A RN @ [phone] 06/26/22. GILBERT, L (NOTE) Lowest detectable limit for serum alcohol is 10 mg/dL.  For medical purposes only. Performed at [facility], [address] [place], [place] [address]    WBC 06/26/2022 12.6 (H)  4.0 - 10.5 K/uL Final   RBC 06/26/2022 5.37 (H)  3.87 - 5.11 MIL/uL Final   Hemoglobin 06/26/2022 15.9 (H)  12.0 - 15.0 g/dL Final   HCT 41/32/4401 46.1 (H)  36.0 - 46.0 % Final   MCV 06/26/2022 85.8  80.0 - 100.0 fL Final   MCH 06/26/2022 29.6  26.0 - 34.0 pg Final   MCHC 06/26/2022 34.5  30.0 - 36.0 g/dL Final   RDW 02/72/5366 13.0  11.5 - 15.5 % Final   Platelets 06/26/2022 212  150 - 400 K/uL Final   nRBC 06/26/2022 0.0  0.0 - 0.2 % Final   Performed at [facility], [address] [place], [place] [address]   Opiates 06/26/2022 NONE DETECTED  NONE DETECTED Final   Cocaine 06/26/2022 NONE DETECTED  NONE DETECTED Final   Benzodiazepines 06/26/2022 NONE DETECTED  NONE DETECTED Final   Amphetamines 06/26/2022 NONE DETECTED  NONE DETECTED Final   Tetrahydrocannabinol 06/26/2022 NONE DETECTED  NONE DETECTED Final   Barbiturates 06/26/2022 NONE DETECTED  NONE DETECTED Final   Comment: (NOTE) DRUG SCREEN FOR MEDICAL PURPOSES ONLY.  IF CONFIRMATION IS NEEDED FOR ANY PURPOSE, NOTIFY  LAB WITHIN 5 DAYS.  LOWEST DETECTABLE LIMITS FOR URINE DRUG SCREEN Drug Class                     Cutoff (ng/mL) Amphetamine and metabolites    1000 Barbiturate and metabolites    200 Benzodiazepine                 200 Tricyclics and metabolites     300 Opiates and metabolites        300 Cocaine and metabolites        300 THC                            50 Performed at [facility], [address] [place], [place] [address]    Preg, Serum 06/26/2022 NEGATIVE  NEGATIVE Final   Comment:        THE SENSITIVITY OF THIS METHODOLOGY IS >10 mIU/mL. Performed at [facility], [address] [place], [place] [address]    Lactic Acid, Venous 06/26/2022 4.9 (HH)  0.5 - 1.9 mmol/L Final   Comment: CRITICAL RESULT CALLED TO, READ BACK BY AND VERIFIED WITH C.BAIN, RN AT 1619 ON 09.14.23 BY N.THOMPSON Performed at [facility], [address], [place], [place] [address]    Lactic Acid, Venous 06/26/2022 3.8 (HH)  0.5 - 1.9 mmol/L Final   Comment: CRITICAL VALUE NOTED. VALUE IS CONSISTENT WITH PREVIOUSLY REPORTED/CALLED VALUE Performed at [facility], [address], [place], [place] [address]    Magnesium 06/26/2022 2.1  1.7 - 2.4 mg/dL Final   Performed at [facility], [address] [place], [place] [address]   Phosphorus 06/26/2022 3.5  2.5 - 4.6 mg/dL Final   Performed at [facility], [address], [place], [place] [address]  Color, Urine 06/26/2022 COLORLESS (A)  YELLOW Final   APPearance 06/26/2022 CLEAR  CLEAR Final   Specific Gravity, Urine 06/26/2022 1.003 (L)  1.005 - 1.030 Final   pH 06/26/2022 5.0  5.0 - 8.0 Final   Glucose, UA 06/26/2022 NEGATIVE  NEGATIVE mg/dL Final   Hgb urine dipstick 06/26/2022 MODERATE (A)  NEGATIVE Final   Bilirubin Urine 06/26/2022 NEGATIVE  NEGATIVE Final   Ketones, ur 06/26/2022 20 (A)  NEGATIVE mg/dL Final   Protein, ur  47/82/9562 NEGATIVE  NEGATIVE mg/dL Final   Nitrite 13/05/6577 NEGATIVE  NEGATIVE Final   Leukocytes,Ua 06/26/2022 NEGATIVE  NEGATIVE Final   WBC, UA 06/26/2022 0-5  0 - 5 WBC/hpf Final   Bacteria, UA 06/26/2022 NONE SEEN  NONE SEEN Final   Performed at [facility], [address], [place], [place] [address]   Sodium 06/26/2022 133 (L)  135 - 145 mmol/L Final   Potassium 06/26/2022 3.4 (L)  3.5 - 5.1 mmol/L Final   Chloride 06/26/2022 97 (L)  98 - 111 mmol/L Final   CO2 06/26/2022 18 (L)  22 - 32 mmol/L Final   Glucose, Bld 06/26/2022 190 (H)  70 - 99 mg/dL Final   Glucose reference range applies only to samples taken after fasting for at least 8 hours.   BUN 06/26/2022 10  6 - 20 mg/dL Final   Creatinine, Ser 06/26/2022 0.58  0.44 - 1.00 mg/dL Final   Calcium 95/28/4132 7.9 (L)  8.9 - 10.3 mg/dL Final   GFR, Estimated 06/26/2022 >60  >60 mL/min Final   Comment: (NOTE) Calculated using the CKD-EPI Creatinine Equation (2021)    Anion gap 06/26/2022 18 (H)  5 - 15 Final   Performed at [facility], [address], [place], [place] [address]   Alcohol, Ethyl (B) 06/26/2022 316 (HH)  <10 mg/dL Final   Comment: CRITICAL RESULT CALLED TO, READ BACK BY AND VERIFIED WITH Vedia Coffer, CRYSTAL RN @ 2103 06/26/2022 PER ENGLAND, K (NOTE) Lowest detectable limit for serum alcohol is 10 mg/dL.  For medical purposes only. Performed at [facility], [address], [place], [place] [address]    Lactic Acid, Venous 06/26/2022 4.5 (HH)  0.5 - 1.9 mmol/L Final   Comment: CRITICAL RESULT CALLED TO, READ BACK BY AND VERIFIED WITH Vedia Coffer CRYSTAL RN @ 2139 06/26/2022 PER ENGLAND, K Performed at [facility], [address], [place], [place] [address]    Lactic Acid, Venous 06/27/2022 3.8 (HH)  0.5 - 1.9 mmol/L Final   Comment: CRITICAL VALUE NOTED. VALUE IS CONSISTENT WITH PREVIOUSLY REPORTED/CALLED VALUE Performed at [facility], [address], [place], [place] [address]    D-Dimer, Quant 06/27/2022 13.32 (H)  0.00 - 0.50 ug/mL-FEU Final   Comment: (NOTE) At the manufacturer cut-off value of 0.5 g/mL FEU, this assay has a negative predictive value of 95-100%.This assay is intended for use in conjunction with a clinical pretest probability (PTP) assessment model to exclude pulmonary embolism (PE) and deep venous thrombosis (DVT) in outpatients suspected of PE or DVT. Results should be correlated with clinical presentation. Performed at [facility], [address], [place], [place] [address]    Lactic Acid, Venous 06/27/2022 2.9 (HH)  0.5 - 1.9 mmol/L Final   Comment: CRITICAL VALUE NOTED. VALUE IS CONSISTENT WITH PREVIOUSLY REPORTED/CALLED VALUE Performed at [facility], [address], [place], [place] [address]    Beta-Hydroxybutyric Acid 06/27/2022 0.42 (H)  0.05 -  0.27 mmol/L Final   Performed at [facility], [address], [place], [place] [address]   HIV Screen 4th Generation wRfx 06/27/2022 Non Reactive  Non Reactive Final   Performed at [facility], [address], [place], [place] [address]   WBC 06/27/2022 8.4  4.0 - 10.5 K/uL Final   RBC 06/27/2022 4.38  3.87 - 5.11 MIL/uL Final   Hemoglobin 06/27/2022 13.2  12.0 - 15.0 g/dL Final   HCT 67/61/9509 37.6  36.0 - 46.0 % Final   MCV 06/27/2022 85.8  80.0 - 100.0 fL Final   MCH 06/27/2022 30.1  26.0 - 34.0 pg Final   MCHC 06/27/2022 35.1  30.0 - 36.0 g/dL Final   RDW 32/67/1245 12.6  11.5 - 15.5 % Final   Platelets 06/27/2022 152  150 - 400 K/uL Final   nRBC 06/27/2022 0.0  0.0 - 0.2 % Final   Performed at [facility], [address], [place], [place] [address]   Sodium 06/27/2022 130 (L)  135 - 145 mmol/L Final   Potassium 06/27/2022 2.9 (L)  3.5 - 5.1 mmol/L Final   Chloride 06/27/2022 93 (L)  98 - 111 mmol/L Final   CO2 06/27/2022 22  22 -  32 mmol/L Final   Glucose, Bld 06/27/2022 200 (H)  70 - 99 mg/dL Final   Glucose reference range applies only to samples taken after fasting for at least 8 hours.   BUN 06/27/2022 8  6 - 20 mg/dL Final   Creatinine, Ser 06/27/2022 0.44  0.44 - 1.00 mg/dL Final   Calcium 33/82/5053 8.0 (L)  8.9 - 10.3 mg/dL Final   Total Protein 97/67/3419 6.0 (L)  6.5 - 8.1 g/dL Final   Albumin 37/90/2409 3.2 (L)  3.5 - 5.0 g/dL Final   AST 73/53/2992 106 (H)  15 - 41 U/L Final   ALT 06/27/2022 63 (H)  0 - 44 U/L Final   Alkaline Phosphatase 06/27/2022 48  38 - 126 U/L Final   Total Bilirubin 06/27/2022 1.5 (H)  0.3 - 1.2 mg/dL Final   GFR, Estimated 06/27/2022 >60  >60 mL/min Final   Comment: (NOTE) Calculated using the CKD-EPI Creatinine Equation (2021)    Anion gap 06/27/2022 15  5 - 15 Final   Performed at [facility], [address], [place], [place] [address]   Phosphorus 06/27/2022 <1.0 (LL)  2.5 - 4.6 mg/dL Final   Comment: CRITICAL RESULT CALLED TO, READ BACK BY AND VERIFIED WITH Vedia Coffer, CRYSTAL RN @ [phone] 06/27/2022 PER ENGLAND, K Performed at [facility], [address], [place], [place] [address]    Magnesium 06/27/2022 1.4 (L)  1.7 - 2.4 mg/dL Final   Performed at [facility], [address] [place], [place] [address]   TSH 06/27/2022 1.910  0.350 - 4.500 uIU/mL Final   Comment: Performed by a 3rd Generation assay with a functional sensitivity of <=0.01 uIU/mL. Performed at [facility], [address], [place], [place] [address]    WBC 06/28/2022 6.7  4.0 - 10.5 K/uL Final   RBC 06/28/2022 4.47  3.87 - 5.11 MIL/uL Final   Hemoglobin 06/28/2022 13.2  12.0 - 15.0 g/dL Final   HCT 40/81/4481 38.4  36.0 - 46.0 % Final   MCV 06/28/2022 85.9  80.0 - 100.0 fL Final   MCH 06/28/2022 29.5  26.0 - 34.0 pg Final   MCHC 06/28/2022 34.4  30.0 - 36.0 g/dL Final  RDW 06/28/2022 12.4  11.5 - 15.5 % Final   Platelets  06/28/2022 132 (L)  150 - 400 K/uL Final   nRBC 06/28/2022 0.0  0.0 - 0.2 % Final   Performed at [facility], [address], [place], [place] [address]   Sodium 06/28/2022 133 (L)  135 - 145 mmol/L Final   Potassium 06/28/2022 2.9 (L)  3.5 - 5.1 mmol/L Final   Chloride 06/28/2022 96 (L)  98 - 111 mmol/L Final   CO2 06/28/2022 30  22 - 32 mmol/L Final   Glucose, Bld 06/28/2022 110 (H)  70 - 99 mg/dL Final   Glucose reference range applies only to samples taken after fasting for at least 8 hours.   BUN 06/28/2022 <5 (L)  6 - 20 mg/dL Final   Creatinine, Ser 06/28/2022 0.50  0.44 - 1.00 mg/dL Final   Calcium 19/14/7829 9.1  8.9 - 10.3 mg/dL Final   Total Protein 56/21/3086 6.2 (L)  6.5 - 8.1 g/dL Final   Albumin 57/84/6962 3.5  3.5 - 5.0 g/dL Final   AST 95/28/4132 69 (H)  15 - 41 U/L Final   ALT 06/28/2022 55 (H)  0 - 44 U/L Final   Alkaline Phosphatase 06/28/2022 51  38 - 126 U/L Final   Total Bilirubin 06/28/2022 1.6 (H)  0.3 - 1.2 mg/dL Final   GFR, Estimated 06/28/2022 >60  >60 mL/min Final   Comment: (NOTE) Calculated using the CKD-EPI Creatinine Equation (2021)    Anion gap 06/28/2022 7  5 - 15 Final   Performed at [facility], [address] [place], [place] [address]   Magnesium 06/28/2022 1.8  1.7 - 2.4 mg/dL Final   Performed at [facility], [address], [place], [place] [address]   Phosphorus 06/28/2022 2.6  2.5 - 4.6 mg/dL Final   Performed at [facility], [address], [place], [place] [address]   Lactic Acid, Venous 06/28/2022 1.5  0.5 - 1.9 mmol/L Final   Performed at [facility], [address], [place], [place] [address]  Admission on 06/13/2022, Discharged on 06/13/2022  Component Date Value Ref Range Status   SARS Coronavirus 2 by RT PCR 06/13/2022 NEGATIVE  NEGATIVE Final   Comment: (NOTE) SARS-CoV-2 target nucleic acids are NOT DETECTED.  The SARS-CoV-2 RNA  is generally detectable in upper and lower respiratory specimens during the acute phase of infection. The lowest concentration of SARS-CoV-2 viral copies this assay can detect is 250 copies / mL. A negative result does not preclude SARS-CoV-2 infection and should not be used as the sole basis for treatment or other patient management decisions.  A negative result may occur with improper specimen collection / handling, submission of specimen other than nasopharyngeal swab, presence of viral mutation(s) within the areas targeted by this assay, and inadequate number of viral copies (<250 copies / mL). A negative result must be combined with clinical observations, patient history, and epidemiological information.  Fact Sheet for Patients:   RoadLapTop.co.za  Fact Sheet for Healthcare Providers: http://kim-miller.com/  This test is not yet approved or                           cleared by the [place] FDA and has been authorized for detection and/or diagnosis of SARS-CoV-2 by FDA under an Emergency Use Authorization (EUA).  This EUA will remain in effect (meaning this test can be used) for  the duration of the COVID-19 declaration under Section 564(b)(1) of the Act, 21 U.S.C. section 360bbb-3(b)(1), unless the authorization is terminated or revoked sooner.  Performed at [facility], [address], [place], [place] [address]     Allergies: Amoxicillin and Penicillins  PTA Medications: (Not in a hospital admission)   Medical Decision Making  Patient will be admitted to [facility] for continuous assessment and management of alcohol withdrawal symptoms. Lab Orders         Resp panel by RT-PCR (RSV, Flu A&B, Covid) Anterior Nasal Swab         CBC with Differential/Platelet         Comprehensive metabolic panel         Hemoglobin A1c         Ethanol         TSH         Lipid panel         POCT Urine Drug Screen - (I-Screen)          POC urine preg, ED         POC SARS Coronavirus 2 Ag     Initiate CIWA protocol -lorazepam 1 mg every 6 hours prn for CIWA >10 -thiamine 100 mg daily for nutritional supplementation -hydroxyzine 25 mg every 6 hours prn for anxiety, CIWA < or = 10 -ondansetron 4 mg ODT every 6 hours prn nausea/vomiting -loperamide 2-4 mg capsule prn diarrhea or loose stools      Recommendations  Based on my evaluation the patient does not appear to have an emergency medical condition.  Maricela Bo, NP 10/19/22  6:53 AM

## 2022-10-18 NOTE — ED Notes (Signed)
Pt A&O x 4, smell of ETOH noted, with slurred speech, very anxious, presents requesting Detox from alcohol.  Pt admits to binge drinking and drinking a 5th of Vodka daily.  Denies SI, HI or AVH.  Monitoring for safety.

## 2022-10-18 NOTE — ED Notes (Signed)
Pt accepted scheduled meds w/o difficulty. Patient stated, "I want to leave. My boyfriend is on the other side so I can't go over there. So, now I just want to go home". Patient stated that she informed the provider of her wishes. Safety maintained and will continue to monitor.

## 2022-10-18 NOTE — BH Assessment (Signed)
Comprehensive Clinical Assessment (CCA) Note  10/18/2022 VIVIEN BARRETTO [id number]  Disposition: Leandro Reasoner, NP, recommends continuous observation for safety and stabilization with psych reassessment in the AM.   The patient demonstrates the following risk factors for suicide: Chronic risk factors for suicide include: psychiatric disorder of depression and substance use disorder. Acute risk factors for suicide include: social withdrawal/isolation and loss (financial, interpersonal, professional). Protective factors for this patient include: positive social support, responsibility to others (children, family), and coping skills. Considering these factors, the overall suicide risk at this point appears to be high. Patient is not appropriate for outpatient follow up.   Chief Complaint:  Chief Complaint  Patient presents with   Alcohol Problem   Visit Diagnosis:   Alcohol dependence  Major depressive symptoms   CCA Screening, Triage and Referral (STR)  Patient Reported Information How did you hear about [name]? Family/Friend  What Is the Reason for Your Visit/Call Today? Jasminne Mealy is a 27 year old female presenting as a voluntary walk-in to [facility] due to alcohol problem. Patient appears to be intoxication. Due to patient vital signs, provider was requested by [facility] to see the patient in case emergency medical attention was needed. Patient denied SI, HI, psychosis and alcohol/drug usage. Patient is accompanied by her twin sister, Kaylyn Lim. Patient gave consent for Lovena Le to be present during assessment. Patient reports binge drinking. Patient reported last drink was today which was a pint of of vodka. Patient reported normally drinking a 5th of vodka daily. Patient started drinking at the age of 55 and increased when she turned 27 years old. Patient stated "I feel like I am going to loose my life due to addiction". Patient stated "its a demon in my brain,  a craving in my head". Patient reports history of 2x rehabs and over 10 detox programs. Patient was on ventilator 6 months ago due to heavy drinking. Patient denied access to guns. Patient currently resides with boyfriend. Patient is currently employed as a Psychologist, sport and exercise. Patient appeared to intoxicated and tearful during assessment.  How Long Has This Been Causing You Problems? > than 6 months  What Do You Feel Would Help You the Most Today? Alcohol or Drug Use Treatment   Have You Recently Had Any Thoughts About Hurting Yourself? No  Are You Planning to Commit Suicide/Harm Yourself At This time? No   Flowsheet Row ED to Hosp-Admission (Discharged) from 06/26/2022 in [place] ED from 06/13/2022 in [facility]  ED from 02/13/2021 in [place] No Risk No Risk No Risk       Have you Recently Had Thoughts About [place]? No  Are You Planning to Harm Someone at This Time? No  Explanation: denied   Have You Used Any Alcohol or Drugs in the Past 24 Hours? Yes  What Did You Use and How Much? 1 pint of alcohol   Do You Currently Have a Therapist/Psychiatrist? No  Name of Therapist/Psychiatrist: Name of Therapist/Psychiatrist: none   Have You Been Recently Discharged From Any Office Practice or Programs? No  Explanation of Discharge From Practice/Program: denied     CCA Screening Triage Referral Assessment Type of Contact: Face-to-Face  Telemedicine Service Delivery:   Is this Initial or Reassessment?   Date Telepsych consult ordered in [facility]:    Time Telepsych consult ordered in [facility]:    Location  of Assessment: [facility] Assessment Services  Provider Location: [facility] Assessment Services   Collateral Involvement: Genia Plants, twin sister. Consent given by patient.   Does Patient Have a Automotive engineer Guardian? No  Legal  Guardian Contact Information: n/a  Copy of Legal Guardianship Form: -- (n/a)  Legal Guardian Notified of Arrival: -- (n/a)  Legal Guardian Notified of Pending Discharge: -- (n/a)  If Minor and Not Living with Parent(s), Who has Custody? n/a  Is CPS involved or ever been involved? Never  Is APS involved or ever been involved? Never   Patient Determined To Be At Risk for Harm To Self or Others Based on Review of Patient Reported Information or Presenting Complaint? No  Method: No Plan  Availability of Means: No access or NA  Intent: -- (n/a)  Notification Required: -- (n/a)  Additional Information for Danger to Others Potential: -- (n/a)  Additional Comments for Danger to Others Potential: none  Are There Guns or Other Weapons in Your Home? No  Types of Guns/Weapons: n/a  Are These Weapons Safely Secured?                            -- (n/a)  Who Could Verify You Are Able To Have These Secured: n/a  Do You Have any Outstanding Charges, Pending Court Dates, Parole/Probation? none reported  Contacted To Inform of Risk of Harm To Self or Others: -- (n/a)    Does Patient Present under Involuntary Commitment? No    [place] of Residence: [place]   Patient Currently Receiving the Following Services: Not Receiving Services   Determination of Need: Urgent (48 hours)   Options For Referral: Outpatient Therapy; Medication Management; Chemical Dependency Intensive Outpatient Therapy (CDIOP); Other: Comment     CCA Biopsychosocial Patient Reported Schizophrenia/Schizoaffective Diagnosis in Past: No   Strengths: Patient is able to communicate.   Mental Health Symptoms Depression:   None; Hopelessness; Fatigue; Difficulty Concentrating; Increase/decrease in appetite; Worthlessness; Change in energy/activity   Duration of Depressive symptoms:    Mania:   None   Anxiety:    None   Psychosis:   None; Hallucinations   Duration of Psychotic symptoms:     Trauma:   None   Obsessions:   None   Compulsions:   None   Inattention:   None   Hyperactivity/Impulsivity:   N/A   Oppositional/Defiant Behaviors:   None   Emotional Irregularity:   None   Other Mood/Personality Symptoms:   none    Mental Status Exam Appearance and self-care  Stature:   Tall   Weight:   Average weight   Clothing:   Disheveled   Grooming:   Normal   Cosmetic use:   None   Posture/gait:   Normal   Motor activity:   Restless   Sensorium  Attention:   Distractible; Confused; Unaware   Concentration:   Normal   Orientation:   X5   Recall/memory:   Normal; Defective in Immediate; Defective in Short-term; Defective in Recent; Defective in Remote   Affect and Mood  Affect:   Depressed; Anxious   Mood:   Anxious; Irritable; Hopeless; Depressed   Relating  Eye contact:   Normal   Facial expression:   Anxious   Attitude toward examiner:   Cooperative   Thought and Language  Speech flow:  Clear and Coherent   Thought content:   Appropriate to Mood and Circumstances   Preoccupation:   None  Hallucinations:   None   Organization:   Coherent   [employer] of Knowledge:   Fair   Intelligence:   Average   Abstraction:   Normal   Judgement:   Fair   Dance movement psychotherapist:   Adequate   Insight:   Fair   Decision Making:   Normal   Social Functioning  Social Maturity:   Responsible   Social Judgement:   Normal   Stress  Stressors:   Family conflict   Coping Ability:   Normal   Skill Deficits:   None   Supports:   Family; Friends/Service system     Religion: Religion/Spirituality Are You A Religious Person?: Yes (Christ) How Might This Affect Treatment?: open to AA engagement  Leisure/Recreation: Leisure / Recreation Do You Have Hobbies?: No  Exercise/Diet: Exercise/Diet Do You Exercise?: No Have You Gained or Lost A Significant Amount of Weight in the Past  Six Months?: No Do You Follow a Special Diet?: No Do You Have Any Trouble Sleeping?: No   CCA Employment/Education Employment/Work Situation: Employment / Work Situation Employment Situation: Employed Work Stressors: none reported Patient's Job has Been Impacted by Current Illness: Yes Describe how Patient's Job has Been Impacted: reports stressors Has Patient ever Been in the [employer]?: No  Education: Education Is Patient Currently Attending School?: No Last Grade Completed: 14 Did You Product manager?: No Did You Have An Individualized Education Program (IIEP): No Did You Have Any Difficulty At [employer]?: No Patient's Education Has Been Impacted by Current Illness: No   CCA Family/Childhood History Family and Relationship History: Family history Marital status: Single Does patient have children?: No  Childhood History:  Childhood History By whom was/is the patient raised?: Mother Did patient suffer any verbal/emotional/physical/sexual abuse as a child?: No Did patient suffer from severe childhood neglect?: No Has patient ever been sexually abused/assaulted/raped as an adolescent or adult?: No Was the patient ever a victim of a crime or a disaster?: No Witnessed domestic violence?: No Has patient been affected by domestic violence as an adult?: No       CCA Substance Use Alcohol/Drug Use: Alcohol / Drug Use Pain Medications: See MAR Prescriptions: See MAR Over the Counter: See MAR History of alcohol / drug use?: Yes Longest period of sobriety (when/how long): 6 months, timeframe unknown Negative Consequences of Use: Work / Programmer, multimedia, Copywriter, advertising relationships Withdrawal Symptoms: Sweats, Nausea / Vomiting, Blackouts, Patient aware of relationship between substance abuse and physical/medical complications (multiple blackouts)                         ASAM's:  Six Dimensions of Multidimensional Assessment  Dimension 1:  Acute Intoxication and/or Withdrawal  Potential:   Dimension 1:  Description of individual's past and current experiences of substance use and withdrawal: Patient continues to struggle with drugs.  Dimension 2:  Biomedical Conditions and Complications:   Dimension 2:  Description of patient's biomedical conditions and  complications: Prease coupon youto completed personal goals.  Dimension 3:  Emotional, Behavioral, or Cognitive Conditions and Complications:  Dimension 3:  Description of emotional, behavioral, or cognitive conditions and complications: patient reported being alcoholic and states "she is not going to make it"  Dimension 4:  Readiness to Change:  Dimension 4:  Description of Readiness to Change criteria: Patient unable to verbalize self-help.  Dimension 5:  Relapse, Continued use, or Continued Problem Potential:  Dimension 5:  Relapse, continued use, or continued problem potential critiera description:  patient has been to rehab 2x and detox over 10 times.  Dimension 6:  Recovery/Living Environment:  Dimension 6:  Recovery/Iiving environment criteria description: patient boyfriend is alcholic  ASAM Severity Score: ASAM's Severity Rating Score: 16  ASAM Recommended Level of Treatment: ASAM Recommended Level of Treatment: Level III Residential Treatment   Substance use Disorder (SUD) Substance Use Disorder (SUD)  Checklist Symptoms of Substance Use: Persistent desire or unsuccessful efforts to cut down or control use, Presence of craving or strong urge to use, Continued use despite persistent or recurrent social, interpersonal problems, caused or exacerbated by use, Large amounts of time spent to obtain, use or recover from the substance(s), Continued use despite having a persistent/recurrent physical/psychological problem caused/exacerbated by use  Recommendations for Services/Supports/Treatments: Recommendations for Services/Supports/Treatments Recommendations For Services/Supports/Treatments: CD-IOP Intensive Chemical  Dependency Program, Detox, SAIOP (Substance Abuse Intensive Outpatient Program)  Discharge Disposition: Discharge Disposition Medical Exam completed: Yes  DSM5 Diagnoses: Patient Active Problem List   Diagnosis Date Noted   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

## 2022-10-18 NOTE — Progress Notes (Signed)
   10/18/22 0054  [place] Triage Screening (Walk-ins at [facility] only)  How Did You Hear About [name]? Family/Friend  What Is the Reason for Your Visit/Call Today? Sherri Harris is a 27 year old female presenting as a voluntary walk-[facility] to [facility] due to alcohol problem. Patient appears to be intoxication. Due to patient vital signs, provider was requested by [facility] to see the patient in case emergency medical attention was needed. Patient denied SI, HI, psychosis and alcohol/drug usage. Patient is accompanied by her twin sister, Sherri Harris. Patient gave consent for Sherri Harris to be present during assessment. Patient reports binge drinking. Patient reported last drink was today which was a pint of of vodka. Patient reported normally drinking a 5th of vodka daily. Patient started drinking at the age of 58 and increased when she turned 27 years old. Patient stated "I feel like I am going to loose my life due to addiction". Patient stated "its a demon in my brain, a craving in my head". Patient reports history of 2x rehabs and over 10 detox programs. Patient was on ventilator 6 months ago due to heavy drinking. Patient denied access to guns. Patient currently resides with boyfriend. Patient is currently employed as a Psychologist, sport and exercise. Patient appeared to intoxicated and tearful during assessment.  How Long Has This Been Causing You Problems? > than 6 months  Have You Recently Had Any Thoughts About Hurting Yourself? No  Are You Planning to Commit Suicide/Harm Yourself At This time? No  Have you Recently Had Thoughts About [place]? No  Are You Planning To Harm Someone At This Time? No  Are you currently experiencing any auditory, visual or other hallucinations? Yes  Please explain the hallucinations you are currently experiencing: visual hallucinations "on yesterday, I was seeing the ceiling move"  Have You Used Any Alcohol or Drugs in the Past 24 Hours? Yes  How long ago  did you use Drugs or Alcohol? alcohol, last time at 4pm  What Did You Use and How Much? 1 pint of alcohol  Do you have any current medical co-morbidities that require immediate attention? No  Clinician description of patient physical appearance/behavior: discheveled and intoxication  What Do You Feel Would Help You the Most Today? Alcohol or Drug Use Treatment  If access to [facility] was not available, would you have sought care in the Emergency Department? Yes  Determination of Need Urgent (48 hours)  Options For Referral Outpatient Therapy;Medication Management;Chemical Dependency Intensive Outpatient Therapy (CDIOP);Other: Comment

## 2022-10-18 NOTE — Discharge Instructions (Addendum)
Please follow up with out patient provider to discuss medication management including use of ativan and vivitrol.   Please refrain from alcohol use and do not use alcohol while taking any medications including ativan... there could be severe adverse reactions including death.     Substance Abuse Treatment Programs  Intensive Outpatient Programs [facility]     [address], [place]       [facility] [place] [address] [place], [place]  [facility]     (Inpatient and outpatient)     [address]           [place] [phone] (Suboxone and Methadone)  [place], [place] [address]      [place] [address] [place], [place]  [facility] (Outpatient/Inpatient, Chemical)    (insurance only) [phone]             [facility] ([place]) [place], [place]     [facility]     [address]     [place], [place]       [facility] (for caregivers and family) [phone] [address] Kristeen Mans. [place], [place]      Residential Treatment Programs [facility]      [address] [place], [place] [address]  [phone]       [facility] [address] [place], [place] [address] [phone]  [facility] [place]        [phone]       [facility] [phone]  [facility] ([place].)             [place], [place] or [place] of [place], [address] [phone]  [facility]    [address]      [place], [place]       [facility] [address] [place], [place]  [place]   [address] [place], [place] [address]     [phone]      Admissions: 8am-3pm M-F  [facility] (RTS) [address] [place], [place]  [facility] Program: Residential Program [phone] Days)   [place], [place] or [phone]     [facility]: [facility] [place], [place] (Walk in Hours over the weekend or  by referral)  [facility] [place], [place], [place] [address] [phone]  Crisis Mobile: [facility]:  [phone] (for crisis response 24 hours a day) [facility] Hotline:      ([phone] Outpatient Psychiatry and Counseling  [facility]: Mobile Crisis Management 24 hours:  [phone]  [facility] of the [employer] sliding scale fee and walk in schedule: M-F 8am-12pm/1pm-3pm [place], [place] [address] [address] [place], [place] [address] [phone]  [facility] ([facility])- new patient walk-in appointments available Monday - Friday 8am -3pm.          [address] [place], [place] [address] [phone] or crisis line- [facility]/ Intensive Outpatient Therapy Program [place], [place] [address] [place]      [phone] [place], [place] [address]                 [place]   [facility] [phone]. [place], [place] [address]   [employer] [facility]          [address] Johnette Abraham  [place], [place] [address]       [phone]  [place], [place], [place]  [address] [phone]  [facility]    [address] [place], [place] [address]     [place], [place] Joycelyn Man     [place] [address]     [phone]       [facility] [place] [address]  [facility]     [address], [place], [place], [place] [address] [place]     [address]     [place], [place] [address]     [phone]       [facility] [address] [place], [place] [address] [phone] Resources for Temporary Residential Assistance/Crisis [facility]) M-F 8am-3pm   [address] [place], [place] [address]   [phone] Services include: laundry, barbering, support groups, case management, phone  & computer access, showers, AA/NA mtgs, mental health/substance abuse nurse, job skills class, disability information, VA assistance, spiritual classes, etc.   [facility]   [address], [place]     [place] (women and children)       [place]. [place], [place] [address] [phone] [email or web address] for application and process Application Required  [facility]   [address]    [place]  [address]     [phone]  [place], [place] [address] [phone]([email or web address] application appt.) Application Required  [facility] (women only)    [address]     [place], [place] [address]     [phone]      Intake starts 6pm daily Need valid ID, SSC, & Police report [employer] [address] [place], [place] [phone] Application Required  [employer] (men only)     [address].      [place], [place]       [facility] (Pregnant women only) [address] [place], [place]  [facility]      [place] [address]i [address].      [place], [place] [address]     [phone]             [facility] [address] [place], [place] 90 day commitment/SA/Application process  [facility](men only)     [address]     [place], [place]       Check-in at [facility] [address] [place], [place] [address] [phone] Men/Women/Women and Children must be there by 7 pm  [place], [place]

## 2022-10-20 ENCOUNTER — Encounter: Payer: Self-pay | Admitting: Internal Medicine

## 2022-10-20 LAB — HEMOGLOBIN A1C
Hgb A1c MFr Bld: 5.1 % (ref 4.8–5.6)
Mean Plasma Glucose: 100 mg/dL

## 2022-10-21 ENCOUNTER — Telehealth: Payer: Self-pay

## 2022-10-21 ENCOUNTER — Ambulatory Visit (INDEPENDENT_AMBULATORY_CARE_PROVIDER_SITE_OTHER): Payer: BLUE CROSS/BLUE SHIELD

## 2022-10-21 DIAGNOSIS — F1020 Alcohol dependence, uncomplicated: Secondary | ICD-10-CM

## 2022-10-21 MED ORDER — NALTREXONE 380 MG IM SUSR
380.0000 mg | Freq: Once | INTRAMUSCULAR | Status: AC
  Administered 2022-10-21: 380 mg via INTRAMUSCULAR

## 2022-10-21 NOTE — Telephone Encounter (Signed)
Pt was in the hospital last week and needs a note for work stating she can return today. (10/21/2022).   Thanks,   -Mickel Baas

## 2022-10-21 NOTE — Telephone Encounter (Signed)
I need to see her for an ER followup

## 2022-10-21 NOTE — Telephone Encounter (Signed)
Left message advising pt.  PEC please schedule when she calls back.    Thanks,   -Mickel Baas

## 2022-10-23 NOTE — Telephone Encounter (Signed)
Work note sent to My Chart.   Thanks,   -Mickel Baas

## 2022-10-23 NOTE — Telephone Encounter (Signed)
Okay for work note that she can go back to work, release this to her [employer]

## 2022-11-18 DIAGNOSIS — F1020 Alcohol dependence, uncomplicated: Secondary | ICD-10-CM | POA: Diagnosis not present

## 2022-11-21 ENCOUNTER — Ambulatory Visit: Payer: BLUE CROSS/BLUE SHIELD

## 2022-12-12 DIAGNOSIS — R945 Abnormal results of liver function studies: Secondary | ICD-10-CM | POA: Diagnosis not present

## 2022-12-12 DIAGNOSIS — F3340 Major depressive disorder, recurrent, in remission, unspecified: Secondary | ICD-10-CM | POA: Diagnosis not present

## 2022-12-12 DIAGNOSIS — F17210 Nicotine dependence, cigarettes, uncomplicated: Secondary | ICD-10-CM | POA: Diagnosis not present

## 2022-12-12 DIAGNOSIS — F1021 Alcohol dependence, in remission: Secondary | ICD-10-CM | POA: Diagnosis not present

## 2022-12-12 DIAGNOSIS — Z720 Tobacco use: Secondary | ICD-10-CM | POA: Diagnosis not present

## 2022-12-12 DIAGNOSIS — R7989 Other specified abnormal findings of blood chemistry: Secondary | ICD-10-CM | POA: Diagnosis not present

## 2023-01-09 ENCOUNTER — Other Ambulatory Visit: Payer: Self-pay

## 2023-03-12 DIAGNOSIS — F331 Major depressive disorder, recurrent, moderate: Secondary | ICD-10-CM | POA: Diagnosis not present

## 2023-03-12 DIAGNOSIS — F1020 Alcohol dependence, uncomplicated: Secondary | ICD-10-CM | POA: Diagnosis not present

## 2023-03-12 DIAGNOSIS — F411 Generalized anxiety disorder: Secondary | ICD-10-CM | POA: Diagnosis not present

## 2023-03-18 DIAGNOSIS — F1020 Alcohol dependence, uncomplicated: Secondary | ICD-10-CM | POA: Diagnosis not present

## 2023-03-18 DIAGNOSIS — F331 Major depressive disorder, recurrent, moderate: Secondary | ICD-10-CM | POA: Diagnosis not present

## 2023-03-18 DIAGNOSIS — F411 Generalized anxiety disorder: Secondary | ICD-10-CM | POA: Diagnosis not present

## 2023-03-20 ENCOUNTER — Encounter: Payer: Self-pay | Admitting: Internal Medicine

## 2023-03-23 MED ORDER — DISULFIRAM 250 MG PO TABS: 500.0000 mg | ORAL_TABLET | Freq: Every day | ORAL | 0 refills | Status: DC

## 2023-03-23 NOTE — Addendum Note (Signed)
Addended by: Lorre Munroe on: 03/23/2023 02:03 PM   Modules accepted: Orders

## 2023-03-26 ENCOUNTER — Telehealth (INDEPENDENT_AMBULATORY_CARE_PROVIDER_SITE_OTHER): Payer: PRIVATE HEALTH INSURANCE | Admitting: Internal Medicine

## 2023-03-26 DIAGNOSIS — Z3009 Encounter for other general counseling and advice on contraception: Secondary | ICD-10-CM | POA: Diagnosis not present

## 2023-03-26 DIAGNOSIS — R7309 Other abnormal glucose: Secondary | ICD-10-CM

## 2023-03-26 DIAGNOSIS — F10982 Alcohol use, unspecified with alcohol-induced sleep disorder: Secondary | ICD-10-CM

## 2023-03-26 DIAGNOSIS — F1094 Alcohol use, unspecified with alcohol-induced mood disorder: Secondary | ICD-10-CM | POA: Diagnosis not present

## 2023-03-26 MED ORDER — NORETHIN-ETH ESTRAD-FE BIPHAS 1 MG-10 MCG / 10 MCG PO TABS: 1.0000 | ORAL_TABLET | Freq: Every day | ORAL | 1 refills | Status: DC

## 2023-03-26 MED ORDER — DISULFIRAM 250 MG PO TABS: 500.0000 mg | ORAL_TABLET | Freq: Every day | ORAL | 0 refills | Status: DC

## 2023-03-26 MED ORDER — CITALOPRAM HYDROBROMIDE 20 MG PO TABS: 20.0000 mg | ORAL_TABLET | Freq: Every day | ORAL | 1 refills | Status: DC

## 2023-03-26 NOTE — Assessment & Plan Note (Addendum)
Antabuse refilled today Continue citalopram and lorazepam as previously prescribed She does not want to follow with psychiatry Support offered

## 2023-03-26 NOTE — Assessment & Plan Note (Signed)
Currently not an issue °We will monitor °

## 2023-03-26 NOTE — Progress Notes (Signed)
Virtual Visit via Video Note  I connected with Sherri Harris on 03/26/23 at 11:20 AM EDT by a video enabled telemedicine application and verified that I am speaking with the correct person using two identifiers.  Location: Patient: At work Provider: Office  Persons participating in this video call: Nicki Reaper, NP and Sherri Harris   I discussed the limitations of evaluation and management by telemedicine and the availability of in person appointments. The patient expressed understanding and agreed to proceed.  History of Present Illness:  Patient due for follow-up of chronic conditions.  Alcohol Use Disorder: She is currently taking Antabuse as prescribed. She has seen a psychiatrist but does not plan on going back.  Mood Disorder: She is taking Citalopram and Lorazepam as prescribed. She is not currently seeing a therapist. She denies SI/HI.  Insomnia: Currently not an issue, only when she is drinking She is not currently taking any medications for this.  There is no sleep study on file.    Past Medical History:  Diagnosis Date   Anxiety    Hypertension     Current Outpatient Medications  Medication Sig Dispense Refill   disulfiram (ANTABUSE) 250 MG tablet Take 2 tablets (500 mg total) by mouth daily. 180 tablet 0   LORazepam (ATIVAN) 0.5 MG tablet Take 1 tablet (0.5 mg total) by mouth every 8 (eight) hours as needed for anxiety. 20 tablet 0   Norethindrone-Ethinyl Estradiol-Fe Biphas (LO LOESTRIN FE) 1 MG-10 MCG / 10 MCG tablet TAKE 1 TABLET BY MOUTH DAILY. 84 tablet 1   ondansetron (ZOFRAN) 4 MG tablet Take 1 tablet (4 mg total) by mouth every 6 (six) hours. 12 tablet 0   VIVITROL 380 MG SUSR INJECT 380 MG INTRAMUSCULARLY EVERY MONTH. REFRIGERATE 1 each 5   No current facility-administered medications for this visit.    Allergies  Allergen Reactions   Amoxicillin Rash    REACTION: rash   Penicillins Rash    Family History  Problem Relation Age of Onset    Hypertension Mother    Thyroid disease Mother    Hypertension Maternal Aunt    Hypertension Maternal Uncle    Alcohol abuse Maternal Uncle    Heart disease Maternal Grandmother    Cancer Maternal Grandmother        Breast   Hypertension Father    Cardiomyopathy Father    Alcohol abuse Maternal Grandfather    Alcohol abuse Paternal Uncle     Social History   Socioeconomic History   Marital status: Single    Spouse name: Not on file   Number of children: Not on file   Years of education: Not on file   Highest education level: Not on file  Occupational History   Not on file  Tobacco Use   Smoking status: Never   Smokeless tobacco: Never  Vaping Use   Vaping Use: Never used  Substance and Sexual Activity   Alcohol use: Yes    Comment: bottle of wine/day    Drug use: No   Sexual activity: Yes    Birth control/protection: Pill  Other Topics Concern   Not on file  Social History Narrative   Not on file   Social Determinants of Health   Financial Resource Strain: Not on file  Food Insecurity: Not on file  Transportation Needs: Not on file  Physical Activity: Not on file  Stress: Not on file  Social Connections: Not on file  Intimate Partner Violence: Not on file  Constitutional: Denies fever, malaise, fatigue, headache or abrupt weight changes.  HEENT: Denies eye pain, eye redness, ear pain, ringing in the ears, wax buildup, runny nose, nasal congestion, bloody nose, or sore throat. Respiratory: Denies difficulty breathing, shortness of breath, cough or sputum production.   Cardiovascular: Denies chest pain, chest tightness, palpitations or swelling in the hands or feet.  Gastrointestinal: Denies abdominal pain, bloating, constipation, diarrhea or blood in the stool.  GU: Denies urgency, frequency, pain with urination, burning sensation, blood in urine, odor or discharge. Musculoskeletal: Denies decrease in range of motion, difficulty with gait, muscle pain or  joint pain and swelling.  Skin: Denies redness, rashes, lesions or ulcercations.  Neurological: Patient reports insomnia when drinking.  Denies dizziness, difficulty with memory, difficulty with speech or problems with balance and coordination.  Psych: Pt has a history of anxiety and depression when she is not sober. Denies SI/HI.  No other specific complaints in a complete review of systems (except as listed in HPI above).  Observations/Objective:   Wt Readings from Last 3 Encounters:  07/16/22 134 lb (60.8 kg)  06/26/22 137 lb (62.1 kg)  01/28/22 137 lb (62.1 kg)    General: Appears her stated age, well developed, well nourished in NAD. Pulmonary/Chest: Normal effort. No respiratory distress.  Neurological: Alert and oriented. Coordination normal.  Psychiatric: Mood and affect normal. Behavior is normal. Judgment and thought content normal.     BMET    Component Value Date/Time   NA 142 10/18/2022 0129   NA 138 01/22/2018 1012   K 4.0 10/18/2022 0129   CL 102 10/18/2022 0129   CO2 25 10/18/2022 0129   GLUCOSE 83 10/18/2022 0129   BUN 6 10/18/2022 0129   BUN 9 01/22/2018 1012   CREATININE 0.90 10/18/2022 0129   CREATININE 0.76 07/16/2022 1108   CALCIUM 9.2 10/18/2022 0129   GFRNONAA >60 10/18/2022 0129   GFRAA >60 04/20/2020 1216    Lipid Panel     Component Value Date/Time   CHOL 235 (H) 10/18/2022 0129   TRIG 78 10/18/2022 0129   HDL 125 10/18/2022 0129   CHOLHDL 1.9 10/18/2022 0129   VLDL 16 10/18/2022 0129   LDLCALC 94 10/18/2022 0129   LDLCALC 87 01/28/2022 1108    CBC    Component Value Date/Time   WBC 7.7 10/18/2022 0129   RBC 4.89 10/18/2022 0129   HGB 14.9 10/18/2022 0129   HGB 14.5 01/22/2018 1012   HCT 43.1 10/18/2022 0129   HCT 42.2 01/22/2018 1012   PLT 311 10/18/2022 0129   PLT 337 01/22/2018 1012   MCV 88.1 10/18/2022 0129   MCV 90 01/22/2018 1012   MCH 30.5 10/18/2022 0129   MCHC 34.6 10/18/2022 0129   RDW 12.8 10/18/2022 0129    RDW 12.4 01/22/2018 1012   LYMPHSABS 3.2 10/18/2022 0129   MONOABS 0.4 10/18/2022 0129   EOSABS 0.0 10/18/2022 0129   BASOSABS 0.1 10/18/2022 0129    Hgb A1C Lab Results  Component Value Date   HGBA1C 5.1 10/18/2022        Assessment and Plan:  RTC in 6 months for annual exam Follow Up Instructions:    I discussed the assessment and treatment plan with the patient. The patient was provided an opportunity to ask questions and all were answered. The patient agreed with the plan and demonstrated an understanding of the instructions.   The patient was advised to call back or seek an in-person evaluation if the symptoms worsen or if  the condition fails to improve as anticipated.    Nicki Reaper, NP

## 2023-03-27 ENCOUNTER — Other Ambulatory Visit: Payer: BLUE CROSS/BLUE SHIELD

## 2023-03-27 ENCOUNTER — Other Ambulatory Visit (HOSPITAL_COMMUNITY): Payer: Self-pay

## 2023-03-27 ENCOUNTER — Other Ambulatory Visit: Payer: Self-pay

## 2023-03-27 MED ORDER — DISULFIRAM 250 MG PO TABS
500.0000 mg | ORAL_TABLET | Freq: Every day | ORAL | 0 refills | Status: DC
  Filled 2023-03-27: qty 14, 7d supply, fill #0

## 2023-03-30 ENCOUNTER — Ambulatory Visit: Payer: BLUE CROSS/BLUE SHIELD | Admitting: Internal Medicine

## 2023-03-31 ENCOUNTER — Other Ambulatory Visit: Payer: Self-pay

## 2023-03-31 MED ORDER — CITALOPRAM HYDROBROMIDE 20 MG PO TABS: 20.0000 mg | ORAL_TABLET | Freq: Every day | ORAL | 1 refills | Status: DC

## 2023-04-02 ENCOUNTER — Other Ambulatory Visit: Payer: Self-pay | Admitting: Internal Medicine

## 2023-04-02 DIAGNOSIS — F1020 Alcohol dependence, uncomplicated: Secondary | ICD-10-CM

## 2023-04-02 MED ORDER — LORAZEPAM 0.5 MG PO TABS: 0.5000 mg | ORAL_TABLET | Freq: Three times a day (TID) | ORAL | 0 refills | Status: DC | PRN

## 2023-06-23 ENCOUNTER — Encounter: Payer: Self-pay | Admitting: Internal Medicine

## 2023-06-23 ENCOUNTER — Telehealth: Payer: PRIVATE HEALTH INSURANCE | Admitting: Internal Medicine

## 2023-06-23 DIAGNOSIS — Z0289 Encounter for other administrative examinations: Secondary | ICD-10-CM

## 2023-06-23 DIAGNOSIS — F1094 Alcohol use, unspecified with alcohol-induced mood disorder: Secondary | ICD-10-CM

## 2023-06-23 MED ORDER — LORAZEPAM 0.5 MG PO TABS: 0.5000 mg | ORAL_TABLET | Freq: Three times a day (TID) | ORAL | 0 refills | Status: DC | PRN

## 2023-06-23 NOTE — Patient Instructions (Signed)
Alcohol Misuse and Dependence Information, Adult Alcohol is a widely available drug and people choose to drink alcohol in different amounts. Alcohol misuse and dependence can have a negative effect on your life. Alcohol misuse is when you use alcohol too much or too often. You may have a hard time setting a limit on the amount you drink. Alcohol dependence is when you use alcohol consistently for a period of time, and your body changes as a result. Alcohol dependence can make it hard for you to stop drinking because you may start to feel sick or different when you do not drink alcohol. These symptoms are known as withdrawal. People who drink alcohol very often and in large amounts, may develop what is called an alcohol use disorder. How can alcohol misuse and dependence affect me? Drinking too much can lead to addiction. You may feel like you need alcohol to function normally. You may drink alcohol before work in the morning, during the day, or as soon as you get home from work in the evening. These actions can result in: Poor work performance. Job loss. Financial problems. Car crashes or criminal charges from driving after drinking alcohol. Problems in your relationships with friends and family. Losing the trust and respect of coworkers, friends, and family. Drinking heavily over a long period of time can permanently damage your body and brain, and can cause lifelong health issues, such as: Damage to your liver or pancreas. Heart problems, high blood pressure, or stroke. Certain cancers. Decreased ability to fight infections. Brain or nerve damage. Depression. Early death, also called premature death. If you are careless or you crave alcohol, it is easy to drink more than your body can handle (overdose). Alcohol overdose is a serious situation that requires hospitalization. It may lead to permanent injuries or death. What can increase my risk? Having a family history of alcohol misuse. Having  depression or other mental health conditions. Beginning to drink at an early age. Binge drinking often. Experiencing trauma, stress, and an unstable home life during childhood. Spending time with people who drink often. What actions can I take to prevent alcohol misuse and dependence? Do not drink alcohol if: Your health care provider tells you not to drink. You are pregnant, may be pregnant, or are planning to become pregnant. If you drink alcohol: Limit how much you have to: 0-1 drink a day for women who are not pregnant. 0-2 drinks a day for men. Know how much alcohol is in your drink. In the [place] one drink equals one 12 oz bottle of beer (355 mL), one 5 oz glass of wine (148 mL), or one 1 oz glass of hard liquor (44 mL). If you think you have an alcohol dependency problem, decide to stop drinking. This can be very hard to do if you are used to frequently drinking alcohol. If you begin to have withdrawal symptoms, talk with your health care provider or a person that you trust. These symptoms may include anxiety, shaky hands, headache, nausea, sweating, or not being able to sleep. Choose to drink nonalcoholic beverages in social gatherings and places where there may be alcohol. Activity Spend more time on activities that you enjoy that do not involve alcohol, like hobbies or exercise. Find healthy ways to cope with stress, such as meditation or spending time with people you care about. General information Talk to your family, coworkers, and friends about supporting you in your efforts to stop drinking. If they drink, ask them not to drink  around you. Spend more time with people who do not drink alcohol. If you think that you have an alcohol dependency problem: Tell friends or family about your concerns. Talk with your health care provider or another health professional about where to get help. Work with a Paramedic and a Network engineer. Consider joining a support group  for people who struggle with alcohol misuse and dependence. Where to find support  Your health care provider. SMART Recovery: smartrecovery.org Local treatment centers or chemical dependency counselors. Local AA groups in your community: CustomizedRugs.fi Where to find more information Centers for Disease Control and Prevention: TonerPromos.no [employer] on Alcohol Abuse and Alcoholism: BackupSupply.hu Alcoholics Anonymous (AA): CustomizedRugs.fi Contact a health care provider if: You drank more or for longer than you intended on more than one occasion. You often drink to the point of vomiting or passing out. You have problems in your life due to drinking, but you continue to drink. You keep drinking even though you feel anxious, depressed, or have experienced memory loss. You have stopped doing the things you used to enjoy in order to drink. You have to drink more than you used to in order to get the effect you want. You experience anxiety, sweating, nausea, shakiness, and trouble sleeping when you try to stop drinking. Get help right away if: You have serious withdrawal symptoms, including: Confusion. Racing heart. High blood pressure. Fever. These symptoms may be an emergency. Get help right away. Call 911. Do not wait to see if the symptoms will go away. Do not drive yourself to the hospital. Also, get help right away if: You have thoughts about hurting yourself or others. Take one of these steps if you feel like you may hurt yourself or others, or have thoughts about taking your own life: Call 911. Call the National Suicide Prevention Lifeline at 703-677-7233 or 988. This is open 24 hours a day. Text the Crisis Text Line at (563)032-9389. Summary Alcohol misuse and dependence can have a negative effect on your life. Drinking too much or too often can lead to addiction. If you drink alcohol, limit how much you use. If you are having trouble keeping your drinking under control, find ways to change your  behavior. Hobbies, calming activities, exercise, or support groups can help. If you feel you need help with changing your drinking habits, talk with your health care provider, a good friend, or a therapist, or go to a support group. This information is not intended to replace advice given to you by your health care provider. Make sure you discuss any questions you have with your health care provider. Document Revised: 12/04/2021 Document Reviewed: 12/04/2021 Elsevier Patient Education  2024 [employer].

## 2023-06-23 NOTE — Progress Notes (Signed)
Virtual Visit via Video Note  I connected with Sherri Harris on 06/23/23 at 11:20 AM EDT by a video enabled telemedicine application and verified that I am speaking with the correct person using two identifiers.  Location: Patient: Home Provider: Office  Persons participating in this video call: Nicki Reaper, NP and [name]   I discussed the limitations of evaluation and management by telemedicine and the availability of in person appointments. The patient expressed understanding and agreed to proceed.  History of Present Illness:  Patient reports she was recently in rehab. She went July 23rd-August 23rd. She has a history of alcohol abuse, anxiety, depression and insomnia. She subsequently developed covid when she was discharged. She got a DUI, upcoming court date on 9/17.  She needs FMLA forms completed.  She is taking citalopram as prescribed.  She is unsure if this helps.  She is not currently seeing a therapist.  She needs something to take as needed for anxiety attacks.  She has a prescription for lorazepam but is no longer taking this.  She denies SI/HI.   Past Medical History:  Diagnosis Date   Anxiety    Hypertension     Current Outpatient Medications  Medication Sig Dispense Refill   citalopram (CELEXA) 20 MG tablet Take 1 tablet (20 mg total) by mouth daily. 90 tablet 1   disulfiram (ANTABUSE) 250 MG tablet Take 2 tablets (500 mg total) by mouth daily. 14 tablet 0   LORazepam (ATIVAN) 0.5 MG tablet Take 1 tablet (0.5 mg total) by mouth every 8 (eight) hours as needed for anxiety. 20 tablet 0   Norethindrone-Ethinyl Estradiol-Fe Biphas (LO LOESTRIN FE) 1 MG-10 MCG / 10 MCG tablet TAKE 1 TABLET BY MOUTH DAILY. 84 tablet 1   No current facility-administered medications for this visit.    Allergies  Allergen Reactions   Amoxicillin Rash    REACTION: rash   Penicillins Rash    Family History  Problem Relation Age of Onset   Hypertension Mother    Thyroid  disease Mother    Hypertension Maternal Aunt    Hypertension Maternal Uncle    Alcohol abuse Maternal Uncle    Heart disease Maternal Grandmother    Cancer Maternal Grandmother        Breast   Hypertension Father    Cardiomyopathy Father    Alcohol abuse Maternal Grandfather    Alcohol abuse Paternal Uncle     Social History   Socioeconomic History   Marital status: Single    Spouse name: Not on file   Number of children: Not on file   Years of education: Not on file   Highest education level: Not on file  Occupational History   Not on file  Tobacco Use   Smoking status: Never   Smokeless tobacco: Never  Vaping Use   Vaping status: Never Used  Substance and Sexual Activity   Alcohol use: Yes    Comment: bottle of wine/day    Drug use: No   Sexual activity: Yes    Birth control/protection: Pill  Other Topics Concern   Not on file  Social History Narrative   Not on file   Social Determinants of Health   Financial Resource Strain: Low Risk  (04/10/2021)   [facility] prior to 12/13/2022., [facility] visits prior to 12/13/2022.   Overall Financial Resource Strain (CARDIA)    Difficulty of Paying Living Expenses: Not very hard  Food  Insecurity: Low Risk  (09/14/2022)   [facility]   Hunger Vital Sign    Worried About Running Out of Food in the Last Year: Never true    Within the past 12 months, the food you bought just didn't last and you didn't have money to get more: Not on file  Transportation Needs: Not on file (09/14/2022)  Physical Activity: Sufficiently Active (04/10/2021)   [facility] from [facility] visits prior to 12/13/2022., [facility] visits prior to 12/13/2022.   Exercise Vital Sign    Days of Exercise per Week: 4 days    Minutes of Exercise per Session: 60 min  Stress: Stress Concern Present (04/10/2021)   Received from  [facility] visits prior to 12/13/2022., [facility] visits prior to 12/13/2022.   [employer] of Occupational Health - Occupational Stress Questionnaire    Feeling of Stress : Very much  Social Connections: Unknown (12/09/2022)   Received from [facility]   Social Network    Social Network: Not on file  Intimate Partner Violence: Unknown (12/09/2022)   [facility] from [facility], [facility]   HITS    Physically Hurt: Not on file    Insult or Talk Down To: Not on file    Threaten Physical Harm: Not on file    Scream or Curse: Not on file  Recent Concern: Intimate Partner Violence - High Risk (09/14/2022)   [facility] prior to 12/13/2022., [facility] visits prior to 12/13/2022.   Safety    How often does anyone, including family and friends, physically hurt you?: Never    How often does anyone, including family and friends, insult or talk down to you?: Sometimes    How often does anyone, including family and friends, threaten you with harm?: Never    How often does anyone, including family and friends, scream or curse at you?: Never     Constitutional: Denies fever, malaise, fatigue, headache or abrupt weight changes.  HEENT: Denies eye pain, eye redness, ear pain, ringing in the ears, wax buildup, runny nose, nasal congestion, bloody nose, or sore throat. Respiratory: Denies difficulty breathing, shortness of breath, cough or sputum production.   Cardiovascular: Denies chest pain, chest tightness, palpitations or swelling in the hands or feet.  Gastrointestinal: Denies abdominal pain, bloating, constipation, diarrhea or blood in the stool.  GU: Denies urgency, frequency, pain with urination, burning sensation, blood in urine, odor or discharge. Musculoskeletal: Denies decrease in range of motion, difficulty with gait, muscle pain or joint pain and swelling.   Skin: Denies redness, rashes, lesions or ulcercations.  Neurological: Patient reports insomnia.  Denies dizziness, difficulty with memory, difficulty with speech or problems with balance and coordination.  Psych: Patient is a history of anxiety.  Denies depression, SI/HI.  No other specific complaints in a complete review of systems (except as listed in HPI above).  Observations/Objective:  Wt Readings from Last 3 Encounters:  07/16/22 134 lb (60.8 kg)  06/26/22 137 lb (62.1 kg)  01/28/22 137 lb (62.1 kg)    General: Appears her stated age, well developed, well nourished in NAD. Pulmonary/Chest: Normal effort. No respiratory distress. Neurological: Alert and oriented.  Coordination normal.  Psychiatric: Mood and affect mildly flat.  Anxious appearing. Judgment and thought content normal.    BMET    Component Value Date/Time   NA 142  10/18/2022 0129   NA 138 01/22/2018 1012   K 4.0 10/18/2022 0129   CL 102 10/18/2022 0129   CO2 25 10/18/2022 0129   GLUCOSE 83 10/18/2022 0129   BUN 6 10/18/2022 0129   BUN 9 01/22/2018 1012   CREATININE 0.90 10/18/2022 0129   CREATININE 0.76 07/16/2022 1108   CALCIUM 9.2 10/18/2022 0129   GFRNONAA >60 10/18/2022 0129   GFRAA >60 04/20/2020 1216    Lipid Panel     Component Value Date/Time   CHOL 235 (H) 10/18/2022 0129   TRIG 78 10/18/2022 0129   HDL 125 10/18/2022 0129   CHOLHDL 1.9 10/18/2022 0129   VLDL 16 10/18/2022 0129   LDLCALC 94 10/18/2022 0129   LDLCALC 87 01/28/2022 1108    CBC    Component Value Date/Time   WBC 7.7 10/18/2022 0129   RBC 4.89 10/18/2022 0129   HGB 14.9 10/18/2022 0129   HGB 14.5 01/22/2018 1012   HCT 43.1 10/18/2022 0129   HCT 42.2 01/22/2018 1012   PLT 311 10/18/2022 0129   PLT 337 01/22/2018 1012   MCV 88.1 10/18/2022 0129   MCV 90 01/22/2018 1012   MCH 30.5 10/18/2022 0129   MCHC 34.6 10/18/2022 0129   RDW 12.8 10/18/2022 0129   RDW 12.4 01/22/2018 1012   LYMPHSABS 3.2 10/18/2022 0129    MONOABS 0.4 10/18/2022 0129   EOSABS 0.0 10/18/2022 0129   BASOSABS 0.1 10/18/2022 0129    Hgb A1C Lab Results  Component Value Date   HGBA1C 5.1 10/18/2022       Assessment and Plan:  Schedule an appointment for your annual exam  Follow Up Instructions:    I discussed the assessment and treatment plan with the patient. The patient was provided an opportunity to ask questions and all were answered. The patient agreed with the plan and demonstrated an understanding of the instructions.   The patient was advised to call back or seek an in-person evaluation if the symptoms worsen or if the condition fails to improve as anticipated.    Nicki Reaper, NP

## 2023-06-23 NOTE — Assessment & Plan Note (Signed)
Has been sober since his recent rehab stay Is considering starting Narcan again Continue citalopram Lorazepam refilled for intermittent anxiety/panic attacks Encouraged her to start meeting with a therapist Encouraged AA meetings

## 2023-06-26 ENCOUNTER — Encounter: Payer: Self-pay | Admitting: Internal Medicine

## 2023-07-02 NOTE — Telephone Encounter (Signed)
I put this in my outbox for [employer] pickup a week ago

## 2023-07-28 ENCOUNTER — Other Ambulatory Visit: Payer: Self-pay

## 2023-07-28 ENCOUNTER — Emergency Department (HOSPITAL_COMMUNITY): Payer: PRIVATE HEALTH INSURANCE

## 2023-07-28 ENCOUNTER — Encounter (HOSPITAL_COMMUNITY): Payer: Self-pay

## 2023-07-28 ENCOUNTER — Emergency Department (HOSPITAL_COMMUNITY)
Admission: EM | Admit: 2023-07-28 | Discharge: 2023-07-28 | Disposition: A | Discharge status: Home / Self Care | Payer: PRIVATE HEALTH INSURANCE | Attending: Emergency Medicine | Admitting: Emergency Medicine

## 2023-07-28 DIAGNOSIS — W19XXXA Unspecified fall, initial encounter: Secondary | ICD-10-CM

## 2023-07-28 DIAGNOSIS — S9001XA Contusion of right ankle, initial encounter: Secondary | ICD-10-CM | POA: Insufficient documentation

## 2023-07-28 DIAGNOSIS — Y908 Blood alcohol level of 240 mg/100 ml or more: Secondary | ICD-10-CM | POA: Insufficient documentation

## 2023-07-28 DIAGNOSIS — W108XXA Fall (on) (from) other stairs and steps, initial encounter: Secondary | ICD-10-CM | POA: Insufficient documentation

## 2023-07-28 DIAGNOSIS — F10120 Alcohol abuse with intoxication, uncomplicated: Secondary | ICD-10-CM | POA: Insufficient documentation

## 2023-07-28 DIAGNOSIS — R9431 Abnormal electrocardiogram [ECG] [EKG]: Secondary | ICD-10-CM | POA: Insufficient documentation

## 2023-07-28 DIAGNOSIS — S99911A Unspecified injury of right ankle, initial encounter: Secondary | ICD-10-CM | POA: Diagnosis present

## 2023-07-28 DIAGNOSIS — S0990XA Unspecified injury of head, initial encounter: Secondary | ICD-10-CM | POA: Diagnosis not present

## 2023-07-28 DIAGNOSIS — M25571 Pain in right ankle and joints of right foot: Secondary | ICD-10-CM | POA: Insufficient documentation

## 2023-07-28 DIAGNOSIS — F10920 Alcohol use, unspecified with intoxication, uncomplicated: Secondary | ICD-10-CM

## 2023-07-28 LAB — BASIC METABOLIC PANEL
Anion gap: 12 (ref 5–15)
BUN: 9 mg/dL (ref 6–20)
CO2: 25 mmol/L (ref 22–32)
Calcium: 8.8 mg/dL — ABNORMAL LOW (ref 8.9–10.3)
Chloride: 107 mmol/L (ref 98–111)
Creatinine, Ser: 0.86 mg/dL (ref 0.44–1.00)
GFR, Estimated: >60 mL/min (ref >60)
Glucose, Bld: 107 mg/dL — ABNORMAL HIGH (ref 70–99)
Potassium: 3.6 mmol/L (ref 3.5–5.1)
Sodium: 144 mmol/L (ref 135–145)

## 2023-07-28 LAB — CBC
HCT: 40 % (ref 36.0–46.0)
Hemoglobin: 13.7 g/dL (ref 12.0–15.0)
MCH: 30.2 pg (ref 26.0–34.0)
MCHC: 34.3 g/dL (ref 30.0–36.0)
MCV: 88.1 fL (ref 80.0–100.0)
Platelets: 329 10*3/uL (ref 150–400)
RBC: 4.54 MIL/uL (ref 3.87–5.11)
RDW: 12.6 % (ref 11.5–15.5)
WBC: 7.2 10*3/uL (ref 4.0–10.5)
nRBC: 0 % (ref 0.0–0.2)

## 2023-07-28 LAB — ETHANOL: Alcohol, Ethyl (B): 308 mg/dL (ref <10)

## 2023-07-28 MED ORDER — CHLORDIAZEPOXIDE HCL 25 MG PO CAPS: ORAL_CAPSULE | ORAL | 0 refills | Status: DC

## 2023-07-28 MED ORDER — HYDROCODONE-ACETAMINOPHEN 5-325 MG PO TABS
1.0000 | ORAL_TABLET | Freq: Once | ORAL | Status: AC
  Administered 2023-07-28: 1 via ORAL

## 2023-07-28 MED ORDER — FENTANYL CITRATE PF 50 MCG/ML IJ SOSY
100.0000 ug | PREFILLED_SYRINGE | Freq: Once | INTRAMUSCULAR | Status: AC
  Administered 2023-07-28: 100 ug via INTRAVENOUS
  Filled 2023-07-28: qty 2

## 2023-07-28 MED ORDER — HYDROCODONE-ACETAMINOPHEN 5-325 MG PO TABS
ORAL_TABLET | ORAL | Status: AC
  Filled 2023-07-28: qty 1

## 2023-07-28 NOTE — ED Provider Notes (Signed)
[place] EMERGENCY DEPARTMENT AT [facility] Provider Note   CSN: [id number] Arrival date & time: 07/28/23  1701     History  Chief Complaint  Patient presents with   Ankle Pain    Sherri Harris is a 27 y.o. female.  History of alcohol abuse presents today with complaints of fall.  She states that same occurred immediately prior to arrival today when she slipped and fell down approximately 10-12 steps.  She states that she only fell on her ankle and did not hit her head or lose consciousness.  Patient appears clinically intoxicated, states she has been drinking wine consistently for the last 2 days.  She presents with her mom who she lives with who cooperates her story states she has a long history of alcohol abuse and multiple stents in rehab.  Of note, patient does state that she received 150 mcs of fentanyl with EMS without significant improvement in her pain.  The history is provided by the patient. No language interpreter was used.  Ankle Pain      Home Medications Prior to Admission medications   Medication Sig Start Date End Date Taking? Authorizing Provider  escitalopram (LEXAPRO) 10 MG tablet Take 10 mg by mouth daily.   Yes [provider]  Norethindrone-Ethinyl Estradiol-Fe Biphas (LO LOESTRIN FE) 1 MG-10 MCG / 10 MCG tablet TAKE 1 TABLET BY MOUTH DAILY. 03/26/23 09/22/23 Yes Baity, Salvadore Oxford, NP  citalopram (CELEXA) 20 MG tablet Take 1 tablet (20 mg total) by mouth daily. Patient not taking: Reported on 07/28/2023 03/31/23   Lorre Munroe, NP  LORazepam (ATIVAN) 0.5 MG tablet Take 1 tablet (0.5 mg total) by mouth every 8 (eight) hours as needed for anxiety. Patient not taking: Reported on 07/28/2023 06/23/23   Lorre Munroe, NP      Allergies    Amoxicillin and Penicillins    Review of Systems   Review of Systems  Musculoskeletal:  Positive for arthralgias.  All other systems reviewed and are negative.   Physical Exam Updated Vital  Signs BP (!) 127/92   Pulse 99   Temp 98.6 F (37 C) (Oral)   Resp 17   SpO2 98%  Physical Exam Vitals and nursing note reviewed.  Constitutional:      General: She is not in acute distress.    Appearance: Normal appearance. She is normal weight. She is not ill-appearing, toxic-appearing or diaphoretic.  HENT:     Head: Normocephalic and atraumatic.     Comments: No racoon eyes No battle sign Eyes:     Extraocular Movements: Extraocular movements intact.     Pupils: Pupils are equal, round, and reactive to light.  Cardiovascular:     Rate and Rhythm: Normal rate and regular rhythm.     Heart sounds: Normal heart sounds.  Pulmonary:     Effort: Pulmonary effort is normal. No respiratory distress.     Breath sounds: Normal breath sounds.  Chest:     Comments: No tenderness to palpation of the anterior chest wall. Abdominal:     General: Abdomen is flat.     Palpations: Abdomen is soft.     Tenderness: There is no abdominal tenderness.  Musculoskeletal:        General: Normal range of motion.     Cervical back: Normal and normal range of motion.     Thoracic back: Normal.     Lumbar back: Normal.     Comments: No midline tenderness, no stepoffs  or deformity noted on palpation of cervical, thoracic, and lumbar spine  Tenderness to palpation of the lateral aspect of the right ankle with associated bruising and swelling.  No obvious deformity.  DP and PT pulses intact and 2+.  No other areas of focal bony tenderness.  Skin:    General: Skin is warm and dry.  Neurological:     General: No focal deficit present.     Mental Status: She is alert and oriented to person, place, and time.  Psychiatric:        Mood and Affect: Mood normal.        Behavior: Behavior normal.     ED Results / Procedures / Treatments   Labs (all labs ordered are listed, but only abnormal results are displayed) Labs Reviewed  BASIC METABOLIC PANEL - Abnormal; Notable for the following components:       Result Value   Glucose, Bld 107 (*)    Calcium 8.8 (*)    All other components within normal limits  ETHANOL - Abnormal; Notable for the following components:   Alcohol, Ethyl (B) 308 (*)    All other components within normal limits  CBC    EKG EKG Interpretation Date/Time:  Tuesday July 28 2023 17:20:14 EDT Ventricular Rate:  94 PR Interval:  128 QRS Duration:  95 QT Interval:  375 QTC Calculation: 469 R Axis:   49  Text Interpretation: Sinus rhythm RSR' in V1 or V2, right VCD or RVH Borderline T abnormalities, anterior leads Confirmed by Alvester Chou [phone]) on 07/28/2023 5:34:52 PM  Radiology DG Ankle Complete Right  Result Date: 07/28/2023 CLINICAL DATA:  Right ankle pain after injury. EXAM: RIGHT ANKLE - COMPLETE 3+ VIEW COMPARISON:  None Available. FINDINGS: There is no evidence of fracture, dislocation, or joint effusion. The ankle mortise is preserved. There is no evidence of arthropathy or other focal bone abnormality. Lateral soft tissue edema. IMPRESSION: Lateral soft tissue edema. No fracture or subluxation. Electronically Signed   By: Narda Rutherford M.D.   On: 07/28/2023 19:43    Procedures Procedures    Medications Ordered in ED Medications  fentaNYL (SUBLIMAZE) injection 100 mcg (100 mcg Intravenous Given 07/28/23 1739)    ED Course/ Medical Decision Making/ A&P                                 Medical Decision Making Amount and/or Complexity of Data Reviewed Labs: ordered. Radiology: ordered.  Risk Prescription drug management.   This patient is a 27 y.o. female who presents to the ED for concern of fall, intoxication, this involves an extensive number of treatment options, and is a complaint that carries with it a high risk of complications and morbidity. The emergent differential diagnosis prior to evaluation includes, but is not limited to,  trauma . This is not an exhaustive differential.   Past Medical History / Co-morbidities /  Social History: Hx alcohol abuse  Additional history: Chart reviewed.  Physical Exam: Physical exam performed. The pertinent findings include: clearly intoxicated, TTP right ATFL of ankle without deformity. No other areas of focal bony tenderness. Alert and oriented  Lab Tests: I ordered, and personally interpreted labs.  The pertinent results include:  Ethanol 308.  No other acute laboratory abnormalities   Imaging Studies: I ordered imaging studies including Ct head and neck, DG right ankle. I independently visualized and interpreted imaging which showed   DG: Lateral soft  tissue edema. No fracture or subluxation.   CT head and cervical spine: NAD   I agree with the radiologist interpretation.   Cardiac Monitoring:  The patient was maintained on a cardiac monitor.  My attending physician Dr. Renaye Rakers viewed and interpreted the cardiac monitored which showed an underlying rhythm of: sinus rhythm. I agree with this interpretation.   Medications: I ordered medication including fentanyl  for pain. Reevaluation of the patient after these medicines showed that the patient improved. I have reviewed the patients home medicines and have made adjustments as needed.   Disposition: After consideration of the diagnostic results and the patients response to treatment, I feel that emergency department workup does not suggest an emergent condition requiring admission or immediate intervention beyond what has been performed at this time. The plan is: Discharge with a walking boot and crutches for her ankle injury.  Likely an ankle sprain given normal x-ray, however recommend close orthopedic follow-up to evaluate for management of this and potential missed fracture diagnosis on x-ray.  Referral given for same.  Recommend RICE and Tylenol/ibuprofen as needed for pain.  Patient also significant history of alcohol abuse and was initially intoxicated.  However upon reassessment she is clinically sober and  able to walk with the crutches in the boot.  Patient is requesting a Librium taper to help withdrawal from alcohol.  Patient's mom is at bedside and notes that they live together and the patient's mom is removed all alcohol from the house and has taken her phone and called her away and will be managing the administration of the Librium and ensuring that she stays sober throughout administration of this Librium.  PDMP reviewed.  Resources given and discussed. Evaluation and diagnostic testing in the emergency department does not suggest an emergent condition requiring admission or immediate intervention beyond what has been performed at this time.  Plan for discharge with close PCP follow-up.  Patient is understanding and amenable with plan, educated on red flag symptoms that would prompt immediate return.  Patient discharged in stable condition.   This is a shared visit with supervising physician Dr. Renaye Rakers who has independently evaluated patient & provided guidance in evaluation/management/disposition, in agreement with care   Final Clinical Impression(s) / ED Diagnoses Final diagnoses:  Acute right ankle pain  Fall, initial encounter  Alcoholic intoxication without complication (HCC)    Rx / DC Orders ED Discharge Orders          Ordered    chlordiazePOXIDE (LIBRIUM) 25 MG capsule        07/28/23 2157          An After Visit Summary was printed and given to the patient.     Vear Clock 07/29/23 2257    Terald Sleeper, MD 07/31/23 [phone]

## 2023-07-28 NOTE — ED Triage Notes (Signed)
Patient arrives by EMS from home with c/o right ankle pain.   Patient fell down 10-12 steps today, no LOC, denies neck or back pain.    Patient arrives with right ankle splinted.   Patient has been drinking x2 days reports last drink at 9AM.   EMS gave patient 150 mcg Fentanyl PTA.   VS  118/78 HR 88

## 2023-07-28 NOTE — ED Notes (Signed)
Ortho tech paged

## 2023-07-28 NOTE — Progress Notes (Signed)
Orthopedic Tech Progress Note Patient Details:  Sherri Harris 05/05/1996 [id number]  Ortho Devices Type of Ortho Device: CAM walker, Crutches Ortho Device/Splint Location: RLE Ortho Device/Splint Interventions: Ordered, Adjustment, Application   Post Interventions Patient Tolerated: Well Instructions Provided: Care of device, Adjustment of device  [place] A Brion Sossamon 07/28/2023, 10:14 PM

## 2023-07-28 NOTE — ED Notes (Signed)
Pt refused to go to CT. Smoot, Shawn Route, PA-C aware

## 2023-07-28 NOTE — Discharge Instructions (Addendum)
As we discussed, your workup in the ER today was reassuring. CT imaging and x-ray did not reveal any emergent concerns.  I suspect you have sustained an ankle sprain.  We have placed you in a walking boot and given you crutches to wear for support of this.  Please wear them as needed, you can remove everything to take a shower and when you sleep at night.  I have given you a referral to orthopedics with number to call to schedule follow-up appointment.  Please call at your earliest convenience. If you are still having significant pain in your ankle or leg after 7 days, you likely need follow-up in the clinic.  Sometimes there are very small fractures or broken bones which are not seen on initial x-rays.  I recommend that you rest, ice, compress, and elevate your foot and take Tylenol/ibuprofen as needed for pain at home.  You are not requiring narcotics or opioids for pain.  Additionally, at your request I have given you a prescription for a Librium taper to help prevent life-threatening side effects as you withdrawal from alcohol.  Please do not drink alcohol while taking this medication and take it "as needed" for withdrawal symptoms.  Return if development of any new or worsening symptoms.

## 2023-09-02 ENCOUNTER — Encounter: Payer: Self-pay | Admitting: Internal Medicine

## 2023-09-02 NOTE — Telephone Encounter (Signed)
Yes, will prescribe if she schedules a virtual appointment

## 2023-09-03 ENCOUNTER — Encounter: Payer: Self-pay | Admitting: Internal Medicine

## 2023-09-03 ENCOUNTER — Telehealth: Payer: PRIVATE HEALTH INSURANCE | Admitting: Internal Medicine

## 2023-09-03 DIAGNOSIS — S92351D Displaced fracture of fifth metatarsal bone, right foot, subsequent encounter for fracture with routine healing: Secondary | ICD-10-CM | POA: Diagnosis not present

## 2023-09-03 DIAGNOSIS — S93401D Sprain of unspecified ligament of right ankle, subsequent encounter: Secondary | ICD-10-CM

## 2023-09-03 MED ORDER — TRAMADOL HCL 50 MG PO TABS
25.0000 mg | ORAL_TABLET | Freq: Two times a day (BID) | ORAL | 0 refills | Status: AC | PRN
Start: 2023-09-03 — End: 2023-09-18

## 2023-09-03 NOTE — Patient Instructions (Signed)
RICE Therapy for Routine Care of Injuries Many injuries can be cared for with rest, ice, compression, and elevation. This is also called RICE therapy. RICE therapy includes: Resting the injured body part. Putting ice on the injury. Putting pressure on the injury. This is also called compression. Raising the injured part. This is also called elevation. RICE therapy can help reduce pain and swelling. Supplies needed: Ice. Plastic bag. Towel. Elastic bandage. Pillow or pillows to raise the injured body part. How to care for your injury with RICE therapy Rest Try to rest the injured part of your body. You can go back to your normal activities when your health care provider says it's okay to do them and when you can do them without pain. Ask what things are safe for you to do. Some injuries heal better with early movement instead of resting. If you rest the injury too much, it may not heal as well. Ask your provider if you should do exercises to help your injury get better. Ice Putting ice on your injury can help to lessen swelling and pain. Do not apply ice directly to your skin. Use ice on as many days as told by your provider. If told, put ice on the area. Put ice in a plastic bag. Place a towel between your skin and the bag. Leave the ice on for 20 minutes, 2-3 times a day. If your skin turns bright red, take off the ice right away to prevent skin damage. The risk of damage is higher if you can't feel pain, heat, or cold.  Compression Put pressure, also called compression, on your injured area. This can be done with an elastic bandage. If this type of bandage has been put on your injury: Follow instructions on the package the bandage came in about how to use it. Do not wrap the bandage too tightly. Wrap the bandage more loosely if part of your body beyond the bandage looks blue, or is swollen, cold, painful, or loses feeling. Take off the bandage and put it on again every 3-4 hours or  as told by your provider. Call your provider if the bandage seems to make your injury worse.  Elevation Raise the injured area above the level of your heart while you're sitting or lying down. Use a pillow to support your injured area as needed. Follow these instructions at home: If your symptoms get worse or last a long time, make a follow-up appointment with your provider. You may need to have imaging tests, such as X-rays or an MRI. If you have imaging tests, ask how to get your results when they are ready. Contact a health care provider if: You keep having pain and swelling. Your symptoms get worse. Get help right away if: You have sudden, very bad pain at your injury or lower than your injury. You have tingling or numbness at your injury or lower than your injury, and it does not go away when you take the bandage off. This information is not intended to replace advice given to you by your health care provider. Make sure you discuss any questions you have with your health care provider. Document Revised: 12/15/2022 Document Reviewed: 12/15/2022 Elsevier Patient Education  2024 [employer].

## 2023-09-03 NOTE — Progress Notes (Signed)
Virtual Visit via Video Note  I connected with Sherri Harris on 09/03/23 at  2:00 PM EST by a video enabled telemedicine application and verified that I am speaking with the correct person using two identifiers.  Location: Patient: Work Provider: Geologist, engineering participating in this video call: Nicki Reaper, NP and [name]   I discussed the limitations of evaluation and management by telemedicine and the availability of in person appointments. The patient expressed understanding and agreed to proceed.  History of Present Illness:   Discussed the use of AI scribe software for clinical note transcription with the patient, who gave verbal consent to proceed.   The patient, employed at [employer], initially presented to the emergency department on October 15th after a fall down some stairs. The initial diagnosis was an ankle sprain. However, upon further evaluation by sports medicine on October 19th, the patient was found to have a fracture of the fifth metatarsal. The patient was subsequently placed in a boot, which she has been wearing for approximately a month and a half.  Despite being advised to transition to regular footwear on November 13th, the patient reports significant pain upon attempting to walk without the boot. The pain is localized to the area of the metatarsal fracture and is immediately provoked by weight-bearing. The patient also reports discomfort in the ankle when wearing the boot.  The patient has been self-medicating with Tramadol 50mg , obtained from a family member, to manage the pain, particularly at night. The patient reports needing to be on her feet for work and has been struggling with the pain.  The patient expresses dissatisfaction with the current care provider, citing a lack of time spent during appointments and inadequate pain management. The patient is considering seeking care from a different provider, possibly a podiatrist, to explore other treatment  options. The patient is currently living in [place] and working in [place].       Past Medical History:  Diagnosis Date   Anxiety    Hypertension     Current Outpatient Medications  Medication Sig Dispense Refill   chlordiazePOXIDE (LIBRIUM) 25 MG capsule 50mg  PO TID x 1D, then 25-50mg  PO BID X 1D, then 25-50mg  PO QD X 1D 10 capsule 0   citalopram (CELEXA) 20 MG tablet Take 1 tablet (20 mg total) by mouth daily. (Patient not taking: Reported on 07/28/2023) 90 tablet 1   escitalopram (LEXAPRO) 10 MG tablet Take 10 mg by mouth daily.     LORazepam (ATIVAN) 0.5 MG tablet Take 1 tablet (0.5 mg total) by mouth every 8 (eight) hours as needed for anxiety. (Patient not taking: Reported on 07/28/2023) 20 tablet 0   Norethindrone-Ethinyl Estradiol-Fe Biphas (LO LOESTRIN FE) 1 MG-10 MCG / 10 MCG tablet TAKE 1 TABLET BY MOUTH DAILY. 84 tablet 1   No current facility-administered medications for this visit.    Allergies  Allergen Reactions   Amoxicillin Rash    REACTION: rash   Penicillins Rash    Family History  Problem Relation Age of Onset   Hypertension Mother    Thyroid disease Mother    Hypertension Maternal Aunt    Hypertension Maternal Uncle    Alcohol abuse Maternal Uncle    Heart disease Maternal Grandmother    Cancer Maternal Grandmother        Breast   Hypertension Father    Cardiomyopathy Father    Alcohol abuse Maternal Grandfather    Alcohol abuse Paternal Uncle     Social History  Socioeconomic History   Marital status: Single    Spouse name: Not on file   Number of children: Not on file   Years of education: Not on file   Highest education level: Not on file  Occupational History   Not on file  Tobacco Use   Smoking status: Never   Smokeless tobacco: Never  Vaping Use   Vaping status: Never Used  Substance and Sexual Activity   Alcohol use: Yes    Comment: bottle of wine/day    Drug use: No   Sexual activity: Yes    Birth control/protection:  Pill  Other Topics Concern   Not on file  Social History Narrative   Not on file   Social Determinants of Health   Financial Resource Strain: Low Risk  (04/10/2021)   Received from [facility] visits prior to 12/13/2022., [facility] visits prior to 12/13/2022.   Overall Financial Resource Strain (CARDIA)    Difficulty of Paying Living Expenses: Not very hard  Food Insecurity: Low Risk  (09/14/2022)   [facility]   Hunger Vital Sign    Worried About Running Out of Food in the Last Year: Never true    Within the past 12 months, the food you bought just didn't last and you didn't have money to get more: Not on file  Transportation Needs: Not on file (09/14/2022)  Physical Activity: Sufficiently Active (04/10/2021)   [facility] from [facility] visits prior to 12/13/2022., [facility] visits prior to 12/13/2022.   Exercise Vital Sign    Days of Exercise per Week: 4 days    Minutes of Exercise per Session: 60 min  Stress: Stress Concern Present (04/10/2021)   [facility] from [facility] visits prior to 12/13/2022., [facility] visits prior to 12/13/2022.   [employer] of Occupational Health - Occupational Stress Questionnaire    Feeling of Stress : Very much  Social Connections: Unknown (12/09/2022)   [facility] from [facility], [facility]   Social Network    Social Network: Not on file  Intimate Partner Violence: Unknown (12/09/2022)   [facility]   HITS    Physically Hurt: Not on file    Insult or Talk Down To: Not on file    Threaten Physical Harm: Not on file    Scream or Curse: Not on file  Recent Concern: Intimate Partner Violence - High Risk (09/14/2022)   [facility] visits prior to 12/13/2022., [facility] visits prior to 12/13/2022.   Safety     How often does anyone, including family and friends, physically hurt you?: Never    How often does anyone, including family and friends, insult or talk down to you?: Sometimes    How often does anyone, including family and friends, threaten you with harm?: Never    How often does anyone, including family and friends, scream or curse at you?: Never     Constitutional: Denies fever, malaise, fatigue, headache or abrupt weight changes.  Respiratory: Denies difficulty breathing, shortness of breath, cough or sputum production.   Cardiovascular: Denies chest pain, chest tightness, palpitations or swelling in the hands or feet.  Musculoskeletal: Pt reports right ankle and foot pain. Denies decrease in range of motion, difficulty with gait, muscle pain.  Neurological: Denies  numbness, tingling, weakness or problems with balance  and coordination.   No other specific complaints in a complete review of systems (except as listed in HPI above).  Observations/Objective:   Wt Readings from Last 3 Encounters:  07/16/22 134 lb (60.8 kg)  06/26/22 137 lb (62.1 kg)  01/28/22 137 lb (62.1 kg)    General: Appears her stated age, well developed, well nourished in NAD. Pulmonary/Chest: Normal effort. No respiratory distress.  Musculoskeletal: Boot noted to RLE. Neurological: Alert and oriented.   BMET    Component Value Date/Time   NA 144 07/28/2023 1830   NA 138 01/22/2018 1012   K 3.6 07/28/2023 1830   CL 107 07/28/2023 1830   CO2 25 07/28/2023 1830   GLUCOSE 107 (H) 07/28/2023 1830   BUN 9 07/28/2023 1830   BUN 9 01/22/2018 1012   CREATININE 0.86 07/28/2023 1830   CREATININE 0.76 07/16/2022 1108   CALCIUM 8.8 (L) 07/28/2023 1830   GFRNONAA >60 07/28/2023 1830   GFRAA >60 04/20/2020 1216    Lipid Panel     Component Value Date/Time   CHOL 235 (H) 10/18/2022 0129   TRIG 78 10/18/2022 0129   HDL 125 10/18/2022 0129   CHOLHDL 1.9 10/18/2022 0129   VLDL 16 10/18/2022 0129    LDLCALC 94 10/18/2022 0129   LDLCALC 87 01/28/2022 1108    CBC    Component Value Date/Time   WBC 7.2 07/28/2023 1830   RBC 4.54 07/28/2023 1830   HGB 13.7 07/28/2023 1830   HGB 14.5 01/22/2018 1012   HCT 40.0 07/28/2023 1830   HCT 42.2 01/22/2018 1012   PLT 329 07/28/2023 1830   PLT 337 01/22/2018 1012   MCV 88.1 07/28/2023 1830   MCV 90 01/22/2018 1012   MCH 30.2 07/28/2023 1830   MCHC 34.3 07/28/2023 1830   RDW 12.6 07/28/2023 1830   RDW 12.4 01/22/2018 1012   LYMPHSABS 3.2 10/18/2022 0129   MONOABS 0.4 10/18/2022 0129   EOSABS 0.0 10/18/2022 0129   BASOSABS 0.1 10/18/2022 0129    Hgb A1C Lab Results  Component Value Date   HGBA1C 5.1 10/18/2022       Assessment and Plan:  Assessment and Plan    Right Fifth Metatarsal Fracture Healing but still causing significant pain, especially when not in the boot. Ankle pain also reported when in the boot. Dissatisfaction with current sports medicine provider. -Referral to a new provider in [place] for a second opinion and potential physical therapy. -Consider MRI of the ankle if pain persists.  Pain Management Currently taking Tramadol 50mg  (half a tablet twice a day) obtained from a family member for pain control. -Prescribe Tramadol 50mg , half a tablet twice a day, to [facility] picked [facility] at [facility].   Schedule an appt for your annual exam  Follow Up Instructions:    I discussed the assessment and treatment plan with the patient. The patient was provided an opportunity to ask questions and all were answered. The patient agreed with the plan and demonstrated an understanding of the instructions.   The patient was advised to call back or seek an in-person evaluation if the symptoms worsen or if the condition fails to improve as anticipated.   Nicki Reaper, NP

## 2023-09-04 ENCOUNTER — Telehealth: Payer: Self-pay | Admitting: Internal Medicine

## 2023-09-04 NOTE — Telephone Encounter (Signed)
Patients mom called to f/u on the medication traMADol (ULTRAM) 50 MG tablet as the pharmacy had questions. Please f/u with pharmacy

## 2023-09-07 ENCOUNTER — Telehealth: Payer: Self-pay

## 2023-09-07 NOTE — Telephone Encounter (Signed)
[facility] for an alternative prescription, PA required. Proceed with PA or would you like to send something else in. No alternatives were provided on fax. Thanks!

## 2023-09-08 NOTE — Telephone Encounter (Signed)
Let pt know insurance denies tramadol. Would recommend she continue Ibuprofen and Tylenol OTC at this time or reach out to orthopedics for further recommendation.

## 2023-09-16 ENCOUNTER — Other Ambulatory Visit: Payer: Self-pay | Admitting: Internal Medicine

## 2023-09-16 DIAGNOSIS — Z3009 Encounter for other general counseling and advice on contraception: Secondary | ICD-10-CM

## 2023-09-18 ENCOUNTER — Other Ambulatory Visit: Payer: Self-pay | Admitting: Internal Medicine

## 2023-09-18 NOTE — Telephone Encounter (Signed)
Requested Prescriptions  Pending Prescriptions Disp Refills   Norethindrone-Ethinyl Estradiol-Fe Biphas (LO LOESTRIN FE) 1 MG-10 MCG / 10 MCG tablet [Pharmacy Med Name: Lo Loestrin Fe 1 mg-10 mcg (24)/10 mcg (2) tablet (norethindrone-e.estradioL-iron)] 84 tablet 0    Sig: Take 1 tablet (10 mcg total) by mouth daily.     OB/GYN:  Contraceptives Passed - 09/16/2023 11:14 AM      Passed - Last BP in normal range    BP Readings from Last 1 Encounters:  07/28/23 107/71         Passed - Valid encounter within last 12 months    Recent Outpatient Visits           2 weeks ago Closed displaced fracture of fifth metatarsal bone of right foot with routine healing, subsequent encounter   [place] [facility] [place], [place] W, NP   2 months ago Alcohol use with alcohol-induced mood disorder [facility])   [place] [facility] [place], [place] W, NP   5 months ago Alcohol use with alcohol-induced mood disorder [facility])   [place] [facility] [place], Salvadore [place], NP   12 months ago Severe alcohol use disorder [facility])   [place] [facility] [place], Salvadore [place], NP   1 year ago Severe alcohol use disorder [facility])   [place] [facility] [place], Salvadore [place], [place]              Passed - Patient is not a smoker       citalopram (CELEXA) 20 MG tablet [Pharmacy Med Name: citalopram 20 mg tablet (CeleXA)] 90 tablet 1    Sig: Take 1 tablet (20 mg total) by mouth daily.     Psychiatry:  Antidepressants - SSRI Passed - 09/16/2023 11:14 AM      Passed - Valid encounter within last 6 months    Recent Outpatient Visits           2 weeks ago Closed displaced fracture of fifth metatarsal bone of right foot with routine healing, subsequent encounter   [place] [facility] [place], [place] W, NP   2 months ago Alcohol use with alcohol-induced mood disorder [facility])   [place] [facility] [place], [place] W, NP   5  months ago Alcohol use with alcohol-induced mood disorder [facility])   [place] [facility] [place], Salvadore [place], NP   12 months ago Severe alcohol use disorder [facility])   [place] [facility] [place], Salvadore [place], NP   1 year ago Severe alcohol use disorder [facility] [place])   [place] [facility] [place], Salvadore [place], [place]

## 2023-09-18 NOTE — Telephone Encounter (Signed)
Requested medication (s) are due for refill today: yes  Requested medication (s) are on the active medication list: yes  Last refill:  06/24/23  Future visit scheduled: no, called and LVM to schedule CPE  Notes to clinic:  not on med list. Was never reordered? Discontinued by: Paschal Dopp, CMA on 03/31/2023 15:32  Reason: Reorder       Requested Prescriptions  Pending Prescriptions Disp Refills   citalopram (CELEXA) 20 MG tablet [Pharmacy Med Name: citalopram 20 mg tablet (CeleXA)] 90 tablet 1    Sig: Take 1 tablet (20 mg total) by mouth daily.     Psychiatry:  Antidepressants - SSRI Passed - 09/16/2023 11:14 AM      Passed - Valid encounter within last 6 months    Recent Outpatient Visits           2 weeks ago Closed displaced fracture of fifth metatarsal bone of right foot with routine healing, subsequent encounter   [place] [facility] [place], [place] W, NP   2 months ago Alcohol use with alcohol-induced mood disorder [facility])   [place] [facility] [place], [place] W, NP   5 months ago Alcohol use with alcohol-induced mood disorder [facility])   [place] [facility] [place], Salvadore [place], NP   12 months ago Severe alcohol use disorder [facility])   [place] [facility] [place], Salvadore Oxford, NP   1 year ago Severe alcohol use disorder [facility])   [place] [facility] [place], Salvadore Oxford, NP              Signed Prescriptions Disp Refills   Norethindrone-Ethinyl Estradiol-Fe Biphas (LO LOESTRIN FE) 1 MG-10 MCG / 10 MCG tablet 84 tablet 0    Sig: Take 1 tablet (10 mcg total) by mouth daily.     OB/GYN:  Contraceptives Passed - 09/16/2023 11:14 AM      Passed - Last BP in normal range    BP Readings from Last 1 Encounters:  07/28/23 107/71         Passed - Valid encounter within last 12 months    Recent Outpatient Visits           2 weeks ago Closed displaced fracture of fifth metatarsal bone of right  foot with routine healing, subsequent encounter   [place] [facility] [place], [place], NP   2 months ago Alcohol use with alcohol-induced mood disorder [facility])   [place] [facility] [place], [place] W, NP   5 months ago Alcohol use with alcohol-induced mood disorder [facility])   [place] [facility] [place], Salvadore [place], NP   12 months ago Severe alcohol use disorder [facility])   [place] [facility] [place], Salvadore Oxford, NP   1 year ago Severe alcohol use disorder [facility])    [facility] [place], Salvad[place], [place]              Passed - Patient is not a smoker

## 2023-09-20 ENCOUNTER — Other Ambulatory Visit: Payer: Self-pay | Admitting: Internal Medicine

## 2023-09-20 DIAGNOSIS — Z3009 Encounter for other general counseling and advice on contraception: Secondary | ICD-10-CM

## 2023-09-21 ENCOUNTER — Other Ambulatory Visit: Payer: Self-pay

## 2023-09-21 ENCOUNTER — Other Ambulatory Visit (HOSPITAL_COMMUNITY): Payer: Self-pay

## 2023-09-21 MED ORDER — ESCITALOPRAM OXALATE 10 MG PO TABS
10.0000 mg | ORAL_TABLET | Freq: Every day | ORAL | 1 refills | Status: DC
  Filled 2023-09-21: qty 30, 30d supply, fill #0

## 2023-09-21 NOTE — Telephone Encounter (Signed)
Requested Prescriptions  Pending Prescriptions Disp Refills   citalopram (CELEXA) 20 MG tablet [Pharmacy Med Name: citalopram 20 mg tablet (CeleXA)] 90 tablet 1    Sig: Take 1 tablet (20 mg total) by mouth daily.     Psychiatry:  Antidepressants - SSRI Passed - 09/18/2023  3:13 PM      Passed - Valid encounter within last 6 months    Recent Outpatient Visits           2 weeks ago Closed displaced fracture of fifth metatarsal bone of right foot with routine healing, subsequent encounter   [place] [facility] [place], [place] W, NP   3 months ago Alcohol use with alcohol-induced mood disorder [facility])   [place] [facility] [place], [place] W, NP   5 months ago Alcohol use with alcohol-induced mood disorder [facility])   [place] [facility] [place], Salvadore [place], NP   12 months ago Severe alcohol use disorder [facility])   [place] [facility] [place], Salv[place], NP   1 year ago Severe alcohol use disorder [facility])   [place] [facility] [place], Salv[place], [place]

## 2023-10-01 ENCOUNTER — Other Ambulatory Visit (HOSPITAL_COMMUNITY): Payer: Self-pay

## 2023-10-30 ENCOUNTER — Encounter: Payer: Self-pay | Admitting: Internal Medicine

## 2023-10-30 NOTE — Telephone Encounter (Signed)
She will need to schedule a virtual appt to get this filled out

## 2023-10-30 NOTE — Telephone Encounter (Unsigned)
Copied from CRM [id number]. Topic: General - Other >> Oct 30, 2023 11:54 AM Shon Hale wrote: Reason for CRM: Pt called to follow up on mychart message sent to office today. Pt inquiring if she can drop off the paperwork. Advised her mychart message was sent to provider and waiting on response. Pt states due to it being close to the weekend she will stop by office to drop off paperwork just in case

## 2023-11-02 ENCOUNTER — Telehealth: Payer: PRIVATE HEALTH INSURANCE | Admitting: Internal Medicine

## 2023-11-02 ENCOUNTER — Encounter: Payer: Self-pay | Admitting: Internal Medicine

## 2023-11-02 DIAGNOSIS — A0811 Acute gastroenteropathy due to Norwalk agent: Secondary | ICD-10-CM | POA: Diagnosis not present

## 2023-11-02 DIAGNOSIS — Z0289 Encounter for other administrative examinations: Secondary | ICD-10-CM

## 2023-11-02 NOTE — Patient Instructions (Signed)
Norovirus Infection Norovirus infection causes inflammation in the stomach and intestines (gastroenteritis) and food poisoning. It is caused by exposure to a virus from a group of similar viruses called noroviruses. Norovirus spreads very easily from person to person (is very contagious). It often occurs in places where people are in close contact, such as schools, nursing homes, restaurants, and cruise ships. You can get it from food, water, surfaces, or other people who have the virus. Norovirus is also found in the stool (feces) or vomit of infected people. You can spread the infection as soon as you feel sick, and you may continue to be contagious after you recover. What are the causes? This condition is caused by contact with norovirus. You can catch norovirus if you: Eat or drink something that is contaminated with norovirus. Touch surfaces or objects that are contaminated with norovirus and then put your hand in or by your mouth or nose. Have direct contact with an infected person who may or may not still have symptoms. Share food, drink, or utensils with someone who is contagious with norovirus. What are the signs or symptoms? Symptoms usually begin within 12 hours to 2 days after you become infected. Most norovirus symptoms affect the digestive system.Symptoms may include: Nausea, vomiting, and diarrhea. Stomach cramps. Fever. Chills. Headache. Muscle aches and tiredness. How is this diagnosed? This condition may be diagnosed based on: Your symptoms. A physical exam. A stool test. How is this treated? There is no specific treatment for norovirus. Most people get better without treatment in about 2 days. Young children, the elderly, and people who are already sick may take up to 6 days to recover. Follow these instructions at home:  Eating and drinking  Drink plenty of water to replace fluids that are lost through diarrhea and vomiting. This prevents dehydration. Drink enough  fluid to keep your urine pale yellow. Drink clear fluids in small amounts as you are able. Clear fluids include water, ice chips, fruit juice with water added (diluted fruit juice), and low-calorie sports drinks. Avoid fluids that contain a lot of sugar or caffeine, such as energy drinks, sports drinks, and soda. Avoid alcohol. If instructed by your health care provider, drink an oral rehydration solution (ORS). This is a drink that is sold at pharmacies and retail stores. An ORS contains minerals (electrolytes) that you can lose through diarrhea and vomiting. Eat bland, easy-to-digest foods in small amounts as you are able. These foods include rice, lean meats, toast, and crackers. Avoid spicy or fatty foods. General instructions Rest at home while you recover. Do not prepare food for others while you are infected. Wait at least 3 days after you recover from the illness to do this. Take over-the-counter and prescription medicines only as told by your health care provider. Wash your hands frequently with soap and water for at least 20 seconds. Alcohol-based hand sanitizer can be used in addition to soap and water, but sanitizer should not be the only cleansing method because it is not effective at removing norovirus from your hands or surfaces. Make sure that each person in your household washes his or her hands well and often. Keep all follow-up visits. This is important. How is this prevented? To help prevent the spread of norovirus: Stay at home if you are feeling sick. This will reduce the risk of spreading the virus to others. Wash your hands often with soap and water for at least 20 seconds, especially after using the toilet, helping a child use  the toilet, or changing a child's diaper. Wash fruits and vegetables thoroughly before peeling, preparing, or serving them. Throw out any food that a sick person may have touched. Disinfect contaminated surfaces immediately after someone in the  household has been sick. Disinfect frequently used surfaces, such as counters, doorknobs, and faucets. Use a bleach-based household cleaner. Immediately remove and wash soiled clothes or sheets. Contact a health care provider if: You have vomiting, diarrhea, or stomach pain that gets worse. You have symptoms that do not go away after 3-6 days. You have a fever. You cannot drink without vomiting. You feel light-headed or dizzy. Your symptoms get worse. Get help right away if: You develop symptoms of dehydration that do not improve with fluid replacement, such as: Excessive sleepiness. Lack of tears. Very little urine production. Dry mouth. Muscle cramps. Weak pulse. Confusion. Summary Norovirus infection is common and often occurs in places where people are in close contact, such as schools, nursing homes, restaurants, and cruise ships. To help prevent the spread of this infection, wash hands with soap and water for at least 20 seconds before handling food or after having contact with stool or body fluids. There is no specific treatment for norovirus, but most people get better without treatment in about 2 days. People who are healthy when infected often recover sooner than those who are elderly, young, or already sick. Replace lost fluids by drinking plenty of water, or by drinking oral rehydration solution (ORS), which contains important minerals called electrolytes. This prevents dehydration. This information is not intended to replace advice given to you by your health care provider. Make sure you discuss any questions you have with your health care provider. Document Revised: 05/08/2021 Document Reviewed: 05/08/2021 Elsevier Patient Education  2024 [employer].

## 2023-11-02 NOTE — Progress Notes (Signed)
Virtual Visit via Video Note  I connected with Sherri Harris on 11/02/23 at  4:00 PM EST by a video enabled telemedicine application and verified that I am speaking with the correct person using two identifiers.  Location: Patient: Home Provider: Office  Person's participating in this video call: Nicki Reaper, NP-C and Laticia Smieja   I discussed the limitations of evaluation and management by telemedicine and the availability of in person appointments. The patient expressed understanding and agreed to proceed.  History of Present Illness:   Discussed the use of AI scribe software for clinical note transcription with the patient, who gave verbal consent to proceed.   The patient, recently diagnosed with norovirus in urgent care on 1/15, began experiencing symptoms on January 12th. The primary symptoms included severe diarrhea and stomach pain, particularly in the upper region of the abdomen. Accompanying these symptoms was a feeling of nausea, although the patient did not report any episodes of vomiting.  Urinalysis was negative for infection.  Her rapid flu was indeterminate.  The patient managed the symptoms with Zofran and Imodium, which she had at home. As of the time of the consultation, the patient reported that all symptoms had resolved, but she was still recovering from the effects of dehydration caused by the diarrhea.  She needs a family form completion to be able to return to work.     Past Medical History:  Diagnosis Date   Anxiety    Hypertension     Current Outpatient Medications  Medication Sig Dispense Refill   chlordiazePOXIDE (LIBRIUM) 25 MG capsule 50mg  PO TID x 1D, then 25-50mg  PO BID X 1D, then 25-50mg  PO QD X 1D 10 capsule 0   escitalopram (LEXAPRO) 10 MG tablet Take 1 tablet (10 mg total) by mouth daily. 90 tablet 1   LORazepam (ATIVAN) 0.5 MG tablet Take 1 tablet (0.5 mg total) by mouth every 8 (eight) hours as needed for anxiety. (Patient not taking: Reported  on 07/28/2023) 20 tablet 0   Norethindrone-Ethinyl Estradiol-Fe Biphas (LO LOESTRIN FE) 1 MG-10 MCG / 10 MCG tablet Take 1 tablet (10 mcg total) by mouth daily. 84 tablet 0   No current facility-administered medications for this visit.    Allergies  Allergen Reactions   Amoxicillin Rash    REACTION: rash   Penicillins Rash    Family History  Problem Relation Age of Onset   Hypertension Mother    Thyroid disease Mother    Hypertension Maternal Aunt    Hypertension Maternal Uncle    Alcohol abuse Maternal Uncle    Heart disease Maternal Grandmother    Cancer Maternal Grandmother        Breast   Hypertension Father    Cardiomyopathy Father    Alcohol abuse Maternal Grandfather    Alcohol abuse Paternal Uncle     Social History   Socioeconomic History   Marital status: Single    Spouse name: Not on file   Number of children: Not on file   Years of education: Not on file   Highest education level: Not on file  Occupational History   Not on file  Tobacco Use   Smoking status: Never   Smokeless tobacco: Never  Vaping Use   Vaping status: Never Used  Substance and Sexual Activity   Alcohol use: Yes    Comment: bottle of wine/day    Drug use: No   Sexual activity: Yes    Birth control/protection: Pill  Other Topics Concern  Not on file  Social History Narrative   Not on file   Social Drivers of Health   Financial Resource Strain: Low Risk  (04/10/2021)   [facility] prior to 12/13/2022., [facility] visits prior to 12/13/2022.   Overall Financial Resource Strain (CARDIA)    Difficulty of Paying Living Expenses: Not very hard  Food Insecurity: Low Risk  (09/14/2022)   [facility]   Hunger Vital Sign    Worried About Running Out of Food in the Last Year: Never true    Within the past 12 months, the food you bought just didn't last and you didn't have money to get more: Not  on file  Transportation Needs: Not on file (09/14/2022)  Physical Activity: Sufficiently Active (04/10/2021)   [facility] prior to 12/13/2022., [facility] visits prior to 12/13/2022.   Exercise Vital Sign    Days of Exercise per Week: 4 days    Minutes of Exercise per Session: 60 min  Stress: Stress Concern Present (04/10/2021)   [facility] from [facility] visits prior to 12/13/2022., [facility] visits prior to 12/13/2022.   [employer] of Occupational Health - Occupational Stress Questionnaire    Feeling of Stress : Very much  Social Connections: Unknown (12/09/2022)   [facility]   Social Network    Social Network: Not on file  Intimate Partner Violence: Unknown (12/09/2022)   [facility], [facility]   HITS    Physically Hurt: Not on file    Insult or Talk Down To: Not on file    Threaten Physical Harm: Not on file    Scream or Curse: Not on file  Recent Concern: Intimate Partner Violence - High Risk (09/14/2022)   [facility] prior to 12/13/2022., [facility] visits prior to 12/13/2022.   Safety    How often does anyone, including family and friends, physically hurt you?: Never    How often does anyone, including family and friends, insult or talk down to you?: Sometimes    How often does anyone, including family and friends, threaten you with harm?: Never    How often does anyone, including family and friends, scream or curse at you?: Never     Constitutional: Denies fever, malaise, fatigue, headache or abrupt weight changes.  HEENT: Denies eye pain, eye redness, ear pain, ringing in the ears, wax buildup, runny nose, nasal congestion, bloody nose, or sore throat. Respiratory: Denies difficulty breathing, shortness of breath, cough or sputum production.    Cardiovascular: Denies chest pain, chest tightness, palpitations or swelling in the hands or feet.  Gastrointestinal: Patient reports nausea, abdominal pain and diarrhea (resolved).  Denies bloating, constipation, or blood in the stool.  GU: Denies urgency, frequency, pain with urination, burning sensation, blood in urine, odor or discharge.  No other specific complaints in a complete review of systems (except as listed in HPI above).  Observations/Objective:   Wt Readings from Last 3 Encounters:  07/16/22 134 lb (60.8 kg)  06/26/22 137 lb (62.1 kg)  01/28/22 137 lb (62.1 kg)    General: Appears her stated age, well developed, well nourished in NAD. Pulmonary/Chest: Normal effort. No respiratory distress.  Neurological: Alert and oriented.   BMET    Component Value Date/Time   NA  144 07/28/2023 1830   NA 138 01/22/2018 1012   K 3.6 07/28/2023 1830   CL 107 07/28/2023 1830   CO2 25 07/28/2023 1830   GLUCOSE 107 (H) 07/28/2023 1830   BUN 9 07/28/2023 1830   BUN 9 01/22/2018 1012   CREATININE 0.86 07/28/2023 1830   CREATININE 0.76 07/16/2022 1108   CALCIUM 8.8 (L) 07/28/2023 1830   GFRNONAA >60 07/28/2023 1830   GFRAA >60 04/20/2020 1216    Lipid Panel     Component Value Date/Time   CHOL 235 (H) 10/18/2022 0129   TRIG 78 10/18/2022 0129   HDL 125 10/18/2022 0129   CHOLHDL 1.9 10/18/2022 0129   VLDL 16 10/18/2022 0129   LDLCALC 94 10/18/2022 0129   LDLCALC 87 01/28/2022 1108    CBC    Component Value Date/Time   WBC 7.2 07/28/2023 1830   RBC 4.54 07/28/2023 1830   HGB 13.7 07/28/2023 1830   HGB 14.5 01/22/2018 1012   HCT 40.0 07/28/2023 1830   HCT 42.2 01/22/2018 1012   PLT 329 07/28/2023 1830   PLT 337 01/22/2018 1012   MCV 88.1 07/28/2023 1830   MCV 90 01/22/2018 1012   MCH 30.2 07/28/2023 1830   MCHC 34.3 07/28/2023 1830   RDW 12.6 07/28/2023 1830   RDW 12.4 01/22/2018 1012   LYMPHSABS 3.2 10/18/2022 0129   MONOABS 0.4 10/18/2022 0129   EOSABS 0.0  10/18/2022 0129   BASOSABS 0.1 10/18/2022 0129    Hgb A1C Lab Results  Component Value Date   HGBA1C 5.1 10/18/2022       Assessment and Plan:  Assessment and Plan    Norovirus Symptoms started on 10/25/2023 with nausea, abdominal pain, and diarrhea. No vomiting. Symptoms have resolved. Managed with Zofran and Imodium. -Return to work on 11/03/2023. -Ensure FMLA work paperwork is completed and sent.       Schedule an appointment for your annual exam  Follow Up Instructions:    I discussed the assessment and treatment plan with the patient. The patient was provided an opportunity to ask questions and all were answered. The patient agreed with the plan and demonstrated an understanding of the instructions.   The patient was advised to call back or seek an in-person evaluation if the symptoms worsen or if the condition fails to improve as anticipated.   Nicki Reaper, NP

## 2023-11-08 ENCOUNTER — Other Ambulatory Visit: Payer: Self-pay | Admitting: Internal Medicine

## 2023-11-08 DIAGNOSIS — Z3009 Encounter for other general counseling and advice on contraception: Secondary | ICD-10-CM

## 2023-11-10 NOTE — Telephone Encounter (Signed)
Requested Prescriptions  Refused Prescriptions Disp Refills   LO LOESTRIN FE 1 MG-10 MCG / 10 MCG tablet [Pharmacy Med Name: Lo Loestrin Fe 1 mg-10 mcg (24)/10 mcg (2) tablet (norethindrone-e.estradioL-iron)] 84 tablet 0    Sig: Take 1 tablet (10 mcg total) by mouth daily.     OB/GYN:  Contraceptives Passed - 11/10/2023  3:42 PM      Passed - Last BP in normal range    BP Readings from Last 1 Encounters:  07/28/23 107/71         Passed - Valid encounter within last 12 months    Recent Outpatient Visits           1 week ago Norovirus   [place] [place], [place], NP   2 months ago Closed displaced fracture of fifth metatarsal bone of right foot with routine healing, subsequent encounter   [place] [facility] [place], [place], NP   4 months ago Alcohol use with alcohol-induced mood disorder [facility])   [place] [facility] [place], [place], NP   7 months ago Alcohol use with alcohol-induced mood disorder [facility])   [place] [facility] [place], [place], NP   1 year ago Severe alcohol use disorder [facility])   [place] [facility] [place], [place], [place]              Passed - Patient is not a smoker

## 2023-11-18 DIAGNOSIS — F1020 Alcohol dependence, uncomplicated: Secondary | ICD-10-CM | POA: Diagnosis not present

## 2023-11-19 DIAGNOSIS — F1020 Alcohol dependence, uncomplicated: Secondary | ICD-10-CM | POA: Diagnosis not present

## 2023-11-20 DIAGNOSIS — F1020 Alcohol dependence, uncomplicated: Secondary | ICD-10-CM | POA: Diagnosis not present

## 2023-11-21 DIAGNOSIS — F1020 Alcohol dependence, uncomplicated: Secondary | ICD-10-CM | POA: Diagnosis not present

## 2023-11-23 DIAGNOSIS — F1020 Alcohol dependence, uncomplicated: Secondary | ICD-10-CM | POA: Diagnosis not present

## 2023-11-24 DIAGNOSIS — F1020 Alcohol dependence, uncomplicated: Secondary | ICD-10-CM | POA: Diagnosis not present

## 2023-11-25 DIAGNOSIS — F1020 Alcohol dependence, uncomplicated: Secondary | ICD-10-CM | POA: Diagnosis not present

## 2023-11-26 DIAGNOSIS — F1020 Alcohol dependence, uncomplicated: Secondary | ICD-10-CM | POA: Diagnosis not present

## 2023-11-29 DIAGNOSIS — F1020 Alcohol dependence, uncomplicated: Secondary | ICD-10-CM | POA: Diagnosis not present

## 2023-11-30 DIAGNOSIS — F1020 Alcohol dependence, uncomplicated: Secondary | ICD-10-CM | POA: Diagnosis not present

## 2023-12-01 DIAGNOSIS — F1020 Alcohol dependence, uncomplicated: Secondary | ICD-10-CM | POA: Diagnosis not present

## 2023-12-02 DIAGNOSIS — F1020 Alcohol dependence, uncomplicated: Secondary | ICD-10-CM | POA: Diagnosis not present

## 2023-12-03 DIAGNOSIS — F1020 Alcohol dependence, uncomplicated: Secondary | ICD-10-CM | POA: Diagnosis not present

## 2023-12-04 DIAGNOSIS — F1020 Alcohol dependence, uncomplicated: Secondary | ICD-10-CM | POA: Diagnosis not present

## 2023-12-05 DIAGNOSIS — F1020 Alcohol dependence, uncomplicated: Secondary | ICD-10-CM | POA: Diagnosis not present

## 2023-12-06 DIAGNOSIS — F1020 Alcohol dependence, uncomplicated: Secondary | ICD-10-CM | POA: Diagnosis not present

## 2023-12-07 DIAGNOSIS — F1020 Alcohol dependence, uncomplicated: Secondary | ICD-10-CM | POA: Diagnosis not present

## 2023-12-08 DIAGNOSIS — F1020 Alcohol dependence, uncomplicated: Secondary | ICD-10-CM | POA: Diagnosis not present

## 2023-12-09 DIAGNOSIS — F1020 Alcohol dependence, uncomplicated: Secondary | ICD-10-CM | POA: Diagnosis not present

## 2023-12-10 DIAGNOSIS — F1020 Alcohol dependence, uncomplicated: Secondary | ICD-10-CM | POA: Diagnosis not present

## 2023-12-11 DIAGNOSIS — F1020 Alcohol dependence, uncomplicated: Secondary | ICD-10-CM | POA: Diagnosis not present

## 2023-12-12 DIAGNOSIS — F1020 Alcohol dependence, uncomplicated: Secondary | ICD-10-CM | POA: Diagnosis not present

## 2023-12-13 DIAGNOSIS — F1020 Alcohol dependence, uncomplicated: Secondary | ICD-10-CM | POA: Diagnosis not present

## 2023-12-14 DIAGNOSIS — F1020 Alcohol dependence, uncomplicated: Secondary | ICD-10-CM | POA: Diagnosis not present

## 2023-12-15 DIAGNOSIS — F1020 Alcohol dependence, uncomplicated: Secondary | ICD-10-CM | POA: Diagnosis not present

## 2023-12-16 DIAGNOSIS — F1020 Alcohol dependence, uncomplicated: Secondary | ICD-10-CM | POA: Diagnosis not present

## 2023-12-24 ENCOUNTER — Other Ambulatory Visit (HOSPITAL_COMMUNITY)
Admission: RE | Admit: 2023-12-24 | Discharge: 2023-12-24 | Disposition: A | Discharge status: Home / Self Care | Source: Ambulatory Visit | Attending: Internal Medicine | Admitting: Internal Medicine

## 2023-12-24 ENCOUNTER — Encounter: Payer: Self-pay | Admitting: Internal Medicine

## 2023-12-24 ENCOUNTER — Ambulatory Visit (INDEPENDENT_AMBULATORY_CARE_PROVIDER_SITE_OTHER): Payer: Self-pay | Admitting: Internal Medicine

## 2023-12-24 VITALS — BP 110/70 | Ht 70.0 in | Wt 157.4 lb

## 2023-12-24 DIAGNOSIS — Z124 Encounter for screening for malignant neoplasm of cervix: Secondary | ICD-10-CM

## 2023-12-24 DIAGNOSIS — Z0000 Encounter for general adult medical examination without abnormal findings: Secondary | ICD-10-CM | POA: Diagnosis not present

## 2023-12-24 MED ORDER — VRAYLAR 1.5 MG PO CAPS: 1.5000 mg | ORAL_CAPSULE | Freq: Every day | ORAL | 1 refills | Status: DC

## 2023-12-24 MED ORDER — SPIRONOLACTONE 50 MG PO TABS: 50.0000 mg | ORAL_TABLET | Freq: Two times a day (BID) | ORAL | 1 refills | Status: DC

## 2023-12-24 MED ORDER — CITALOPRAM HYDROBROMIDE 40 MG PO TABS: 40.0000 mg | ORAL_TABLET | Freq: Every day | ORAL | 1 refills | Status: DC

## 2023-12-24 NOTE — Patient Instructions (Signed)

## 2023-12-24 NOTE — Progress Notes (Signed)
 Subjective:    Patient ID: Sherri Harris, female    DOB: [birth year], 28 y.o.   MRN: [id number]  HPI  Patient presents the clinic today for her annual exam.   Flu: 07/2023  Tetanus: 07/2019  Covid: Pfizer x 1 Pap Smear: [facility] Dentist: biannually  Diet: She does not eat meat.  She consumes fruits and vegetables daily.  She does eat some fried food.  She drinks mostly water, dt. Coke Exercise: None  Review of Systems      Past Medical History:  Diagnosis Date   Anxiety    Hypertension     Current Outpatient Medications  Medication Sig Dispense Refill   Norethindrone-Ethinyl Estradiol-Fe Biphas (LO LOESTRIN FE) 1 MG-10 MCG / 10 MCG tablet Take 1 tablet (10 mcg total) by mouth daily. 84 tablet 0   No current facility-administered medications for this visit.    Allergies  Allergen Reactions   Amoxicillin Rash    REACTION: rash   Penicillins Rash    Family History  Problem Relation Age of Onset   Hypertension Mother    Thyroid disease Mother    Hypertension Maternal Aunt    Hypertension Maternal Uncle    Alcohol abuse Maternal Uncle    Heart disease Maternal Grandmother    Cancer Maternal Grandmother        Breast   Hypertension Father    Cardiomyopathy Father    Alcohol abuse Maternal Grandfather    Alcohol abuse Paternal Uncle     Social History   Socioeconomic History   Marital status: Single    Spouse name: Not on file   Number of children: Not on file   Years of education: Not on file   Highest education level: Not on file  Occupational History   Not on file  Tobacco Use   Smoking status: Never   Smokeless tobacco: Never  Vaping Use   Vaping status: Never Used  Substance and Sexual Activity   Alcohol use: Yes    Comment: bottle of wine/day    Drug use: No   Sexual activity: Yes    Birth control/protection: Pill  Other Topics Concern   Not on file  Social History Narrative   Not on file   Social Drivers of Health    Financial Resource Strain: Low Risk  (04/10/2021)   [facility] to 12/13/2022., [facility] visits prior to 12/13/2022.   Overall Financial Resource Strain (CARDIA)    Difficulty of Paying Living Expenses: Not very hard  Food Insecurity: Low Risk  (09/14/2022)   [facility]   Hunger Vital Sign    Worried About Running Out of Food in the Last Year: Never true    Within the past 12 months, the food you bought just didn't last and you didn't have money to get more: Not on file  Transportation Needs: Not on file (09/14/2022)  Physical Activity: Sufficiently Active (04/10/2021)   [facility] visits prior to 12/13/2022., [facility] visits prior to 12/13/2022.   Exercise Vital Sign    Days of Exercise per Week: 4 days    Minutes of Exercise per Session: 60 min  Stress: Stress Concern Present (04/10/2021)   [facility] visits prior to 12/13/2022., [facility] visits prior to 12/13/2022.   [employer] of Occupational Health -  Occupational Stress Questionnaire    Feeling of Stress : Very much  Social Connections: Unknown (12/09/2022)   [facility] from [facility], [facility]   Social Network    Social Network: Not on file  Intimate Partner Violence: Unknown (12/09/2022)   [facility], [facility]   HITS    Physically Hurt: Not on file    Insult or Talk Down To: Not on file    Threaten Physical Harm: Not on file    Scream or Curse: Not on file  Recent Concern: Intimate Partner Violence - High Risk (09/14/2022)   [facility] visits prior to 12/13/2022., [facility] visits prior to 12/13/2022.   Safety    How often does anyone, including family and friends, physically hurt you?: Never    How often does anyone,  including family and friends, insult or talk down to you?: Sometimes    How often does anyone, including family and friends, threaten you with harm?: Never    How often does anyone, including family and friends, scream or curse at you?: Never     Constitutional: Denies fever, malaise, fatigue, headache or abrupt weight changes.  HEENT: Denies eye pain, eye redness, ear pain, ringing in the ears, wax buildup, runny nose, nasal congestion, bloody nose, or sore throat. Respiratory: Denies difficulty breathing, shortness of breath, cough or sputum production.   Cardiovascular: Denies chest pain, chest tightness, palpitations or swelling in the hands or feet.  Gastrointestinal: Denies abdominal pain, bloating, constipation, diarrhea or blood in the stool.  GU: Denies urgency, frequency, pain with urination, burning sensation, blood in urine, odor or discharge. Musculoskeletal: Denies decrease in range of motion, difficulty with gait, muscle pain or joint pain and swelling.  Skin: Denies redness, rashes, lesions or ulcercations.  Neurological: Patient reports insomnia.  Denies dizziness, difficulty with memory, difficulty with speech or problems with balance and coordination.  Psych: Patient has a history of anxiety and depression.  Denies SI/HI.  No other specific complaints in a complete review of systems (except as listed in HPI above).  Objective:   Physical Exam  BP 110/70 (BP Location: Left Arm, Patient Position: Sitting, Cuff Size: Normal)   Ht 5\' 10"  (1.778 m)   Wt 157 lb 6.4 oz (71.4 kg)   LMP  (LMP Unknown)   BMI 22.58 kg/m    Wt Readings from Last 3 Encounters:  07/16/22 134 lb (60.8 kg)  06/26/22 137 lb (62.1 kg)  01/28/22 137 lb (62.1 kg)    General: Appears her stated age, well developed, well nourished in NAD. Skin: Warm, dry and intact. No rashes, lesions or ulcerations noted. HEENT: Head: normal shape and size; Eyes: sclera white, no icterus, conjunctiva pink, PERRLA  and EOMs intact;  Neck:  Neck supple, trachea midline. No masses, lumps or thyromegaly present.  Cardiovascular: Normal rate and rhythm. S1,S2 noted.  No murmur, rubs or gallops noted. No JVD or BLE edema.  Pulmonary/Chest: Normal effort and positive vesicular breath sounds. No respiratory distress. No wheezes, rales or ronchi noted.  Abdomen: Soft and nontender. Normal bowel sounds. No distention or masses noted. Liver, spleen and kidneys non palpable. Pelvis: Normal female anatomy. Cervix without mass or lesion. No CMT. Adnexa non palpable.  Musculoskeletal: Strength 5/5 BUE/BLE.  No difficulty with gait.  Neurological: Alert and oriented. Cranial nerves II-XII grossly intact. Coordination normal.  Psychiatric: Mood and affect normal. Behavior is normal. Judgment and thought content normal.  BMET    Component Value Date/Time   NA 144 07/28/2023 1830   NA 138 01/22/2018 1012   K 3.6 07/28/2023 1830   CL 107 07/28/2023 1830   CO2 25 07/28/2023 1830   GLUCOSE 107 (H) 07/28/2023 1830   BUN 9 07/28/2023 1830   BUN 9 01/22/2018 1012   CREATININE 0.86 07/28/2023 1830   CREATININE 0.76 07/16/2022 1108   CALCIUM 8.8 (L) 07/28/2023 1830   GFRNONAA >60 07/28/2023 1830   GFRAA >60 04/20/2020 1216    Lipid Panel     Component Value Date/Time   CHOL 235 (H) 10/18/2022 0129   TRIG 78 10/18/2022 0129   HDL 125 10/18/2022 0129   CHOLHDL 1.9 10/18/2022 0129   VLDL 16 10/18/2022 0129   LDLCALC 94 10/18/2022 0129   LDLCALC 87 01/28/2022 1108    CBC    Component Value Date/Time   WBC 7.2 07/28/2023 1830   RBC 4.54 07/28/2023 1830   HGB 13.7 07/28/2023 1830   HGB 14.5 01/22/2018 1012   HCT 40.0 07/28/2023 1830   HCT 42.2 01/22/2018 1012   PLT 329 07/28/2023 1830   PLT 337 01/22/2018 1012   MCV 88.1 07/28/2023 1830   MCV 90 01/22/2018 1012   MCH 30.2 07/28/2023 1830   MCHC 34.3 07/28/2023 1830   RDW 12.6 07/28/2023 1830   RDW 12.4 01/22/2018 1012   LYMPHSABS 3.2 10/18/2022 0129    MONOABS 0.4 10/18/2022 0129   EOSABS 0.0 10/18/2022 0129   BASOSABS 0.1 10/18/2022 0129    Hgb A1C Lab Results  Component Value Date   HGBA1C 5.1 10/18/2022            Assessment & Plan:   Preventative health maintenance:  Flu shot UTD Tetanus UTD Encouraged her to finish out her Covid series Pap smear today, she declines STD screening Encouraged her to consume a balanced diet and exercise regimen Advised her to see a dentist annually She declines labs today  RTC in 1 year, sooner if needed  Nicki Reaper, NP

## 2023-12-29 ENCOUNTER — Other Ambulatory Visit: Payer: Self-pay

## 2023-12-29 ENCOUNTER — Other Ambulatory Visit: Payer: Self-pay | Admitting: Internal Medicine

## 2023-12-29 DIAGNOSIS — Z3009 Encounter for other general counseling and advice on contraception: Secondary | ICD-10-CM

## 2023-12-29 LAB — CYTOLOGY - PAP
Comment: NEGATIVE
Diagnosis: UNDETERMINED — AB
High risk HPV: NEGATIVE

## 2023-12-29 MED ORDER — LO LOESTRIN FE 1 MG-10 MCG / 10 MCG PO TABS: 1.0000 | ORAL_TABLET | Freq: Every day | ORAL | 0 refills | Status: DC

## 2023-12-30 ENCOUNTER — Encounter: Payer: Self-pay | Admitting: Internal Medicine

## 2023-12-31 DIAGNOSIS — F419 Anxiety disorder, unspecified: Secondary | ICD-10-CM | POA: Diagnosis not present

## 2023-12-31 DIAGNOSIS — F338 Other recurrent depressive disorders: Secondary | ICD-10-CM | POA: Diagnosis not present

## 2023-12-31 DIAGNOSIS — F1020 Alcohol dependence, uncomplicated: Secondary | ICD-10-CM | POA: Diagnosis not present

## 2024-01-05 ENCOUNTER — Other Ambulatory Visit: Payer: Self-pay | Admitting: Internal Medicine

## 2024-01-05 DIAGNOSIS — Z3009 Encounter for other general counseling and advice on contraception: Secondary | ICD-10-CM

## 2024-01-05 NOTE — Telephone Encounter (Signed)
 Too soon

## 2024-01-06 DIAGNOSIS — F902 Attention-deficit hyperactivity disorder, combined type: Secondary | ICD-10-CM | POA: Diagnosis not present

## 2024-01-06 DIAGNOSIS — F338 Other recurrent depressive disorders: Secondary | ICD-10-CM | POA: Diagnosis not present

## 2024-01-06 DIAGNOSIS — F1020 Alcohol dependence, uncomplicated: Secondary | ICD-10-CM | POA: Diagnosis not present

## 2024-01-06 DIAGNOSIS — F419 Anxiety disorder, unspecified: Secondary | ICD-10-CM | POA: Diagnosis not present

## 2024-01-20 DIAGNOSIS — T50904A Poisoning by unspecified drugs, medicaments and biological substances, undetermined, initial encounter: Secondary | ICD-10-CM | POA: Diagnosis not present

## 2024-01-20 DIAGNOSIS — K852 Alcohol induced acute pancreatitis without necrosis or infection: Secondary | ICD-10-CM | POA: Diagnosis not present

## 2024-01-20 DIAGNOSIS — E872 Acidosis, unspecified: Secondary | ICD-10-CM | POA: Diagnosis not present

## 2024-01-20 DIAGNOSIS — F10239 Alcohol dependence with withdrawal, unspecified: Secondary | ICD-10-CM | POA: Diagnosis not present

## 2024-01-20 DIAGNOSIS — Z87891 Personal history of nicotine dependence: Secondary | ICD-10-CM | POA: Diagnosis not present

## 2024-01-20 DIAGNOSIS — F10129 Alcohol abuse with intoxication, unspecified: Secondary | ICD-10-CM | POA: Diagnosis not present

## 2024-01-20 DIAGNOSIS — F17219 Nicotine dependence, cigarettes, with unspecified nicotine-induced disorders: Secondary | ICD-10-CM | POA: Diagnosis not present

## 2024-01-20 DIAGNOSIS — K760 Fatty (change of) liver, not elsewhere classified: Secondary | ICD-10-CM | POA: Diagnosis not present

## 2024-01-20 DIAGNOSIS — N3289 Other specified disorders of bladder: Secondary | ICD-10-CM | POA: Diagnosis not present

## 2024-01-20 DIAGNOSIS — R0902 Hypoxemia: Secondary | ICD-10-CM | POA: Diagnosis not present

## 2024-01-20 DIAGNOSIS — F411 Generalized anxiety disorder: Secondary | ICD-10-CM | POA: Diagnosis not present

## 2024-01-20 DIAGNOSIS — E871 Hypo-osmolality and hyponatremia: Secondary | ICD-10-CM | POA: Diagnosis not present

## 2024-01-20 DIAGNOSIS — F17200 Nicotine dependence, unspecified, uncomplicated: Secondary | ICD-10-CM | POA: Diagnosis not present

## 2024-01-20 DIAGNOSIS — I10 Essential (primary) hypertension: Secondary | ICD-10-CM | POA: Diagnosis not present

## 2024-01-20 DIAGNOSIS — R059 Cough, unspecified: Secondary | ICD-10-CM | POA: Diagnosis not present

## 2024-01-20 DIAGNOSIS — F19939 Other psychoactive substance use, unspecified with withdrawal, unspecified: Secondary | ICD-10-CM | POA: Diagnosis not present

## 2024-01-20 DIAGNOSIS — Y908 Blood alcohol level of 240 mg/100 ml or more: Secondary | ICD-10-CM | POA: Diagnosis not present

## 2024-01-20 DIAGNOSIS — K859 Acute pancreatitis without necrosis or infection, unspecified: Secondary | ICD-10-CM | POA: Diagnosis not present

## 2024-01-23 DIAGNOSIS — R079 Chest pain, unspecified: Secondary | ICD-10-CM | POA: Diagnosis not present

## 2024-01-23 DIAGNOSIS — E876 Hypokalemia: Secondary | ICD-10-CM | POA: Diagnosis not present

## 2024-01-23 DIAGNOSIS — Z5989 Other problems related to housing and economic circumstances: Secondary | ICD-10-CM | POA: Diagnosis not present

## 2024-01-23 DIAGNOSIS — F1020 Alcohol dependence, uncomplicated: Secondary | ICD-10-CM | POA: Diagnosis not present

## 2024-01-23 DIAGNOSIS — I10 Essential (primary) hypertension: Secondary | ICD-10-CM | POA: Diagnosis not present

## 2024-01-23 DIAGNOSIS — F10229 Alcohol dependence with intoxication, unspecified: Secondary | ICD-10-CM | POA: Diagnosis not present

## 2024-01-23 DIAGNOSIS — K760 Fatty (change of) liver, not elsewhere classified: Secondary | ICD-10-CM | POA: Diagnosis not present

## 2024-01-23 DIAGNOSIS — Z1159 Encounter for screening for other viral diseases: Secondary | ICD-10-CM | POA: Diagnosis not present

## 2024-01-23 DIAGNOSIS — F10239 Alcohol dependence with withdrawal, unspecified: Secondary | ICD-10-CM | POA: Diagnosis not present

## 2024-01-23 DIAGNOSIS — F10929 Alcohol use, unspecified with intoxication, unspecified: Secondary | ICD-10-CM | POA: Diagnosis not present

## 2024-01-23 DIAGNOSIS — K859 Acute pancreatitis without necrosis or infection, unspecified: Secondary | ICD-10-CM | POA: Diagnosis not present

## 2024-01-23 DIAGNOSIS — R000 Tachycardia, unspecified: Secondary | ICD-10-CM | POA: Diagnosis not present

## 2024-01-23 DIAGNOSIS — R7401 Elevation of levels of liver transaminase levels: Secondary | ICD-10-CM | POA: Diagnosis not present

## 2024-01-23 DIAGNOSIS — K852 Alcohol induced acute pancreatitis without necrosis or infection: Secondary | ICD-10-CM | POA: Diagnosis not present

## 2024-01-23 DIAGNOSIS — F17290 Nicotine dependence, other tobacco product, uncomplicated: Secondary | ICD-10-CM | POA: Diagnosis not present

## 2024-01-23 DIAGNOSIS — D696 Thrombocytopenia, unspecified: Secondary | ICD-10-CM | POA: Diagnosis not present

## 2024-01-23 DIAGNOSIS — R109 Unspecified abdominal pain: Secondary | ICD-10-CM | POA: Diagnosis not present

## 2024-01-23 DIAGNOSIS — Y908 Blood alcohol level of 240 mg/100 ml or more: Secondary | ICD-10-CM | POA: Diagnosis not present

## 2024-01-23 DIAGNOSIS — R1084 Generalized abdominal pain: Secondary | ICD-10-CM | POA: Diagnosis not present

## 2024-02-19 ENCOUNTER — Encounter: Payer: Self-pay | Admitting: Internal Medicine

## 2024-02-19 ENCOUNTER — Telehealth (INDEPENDENT_AMBULATORY_CARE_PROVIDER_SITE_OTHER): Admitting: Internal Medicine

## 2024-02-19 DIAGNOSIS — F1094 Alcohol use, unspecified with alcohol-induced mood disorder: Secondary | ICD-10-CM

## 2024-02-19 DIAGNOSIS — F10982 Alcohol use, unspecified with alcohol-induced sleep disorder: Secondary | ICD-10-CM

## 2024-02-19 DIAGNOSIS — K852 Alcohol induced acute pancreatitis without necrosis or infection: Secondary | ICD-10-CM | POA: Diagnosis not present

## 2024-02-19 DIAGNOSIS — F10929 Alcohol use, unspecified with intoxication, unspecified: Secondary | ICD-10-CM

## 2024-02-19 MED ORDER — CITALOPRAM HYDROBROMIDE 40 MG PO TABS: 40.0000 mg | ORAL_TABLET | Freq: Every day | ORAL | 1 refills | Status: AC

## 2024-02-19 MED ORDER — ESZOPICLONE 3 MG PO TABS: 3.0000 mg | ORAL_TABLET | Freq: Every day | ORAL | 0 refills | Status: AC

## 2024-02-19 NOTE — Progress Notes (Signed)
 Virtual Visit via Video Note  I connected with Sherri Harris on 02/19/24 at  2:40 PM EDT by a video enabled telemedicine application and verified that I am speaking with the correct person using two identifiers.  Location: Patient: Home Provider: Office  Person's participating in this video call: Helayne Lo, NP-C and Yamille Vandeusen   I discussed the limitations of evaluation and management by telemedicine and the availability of in person appointments. The patient expressed understanding and agreed to proceed.  History of Present Illness:   Discussed the use of AI scribe software for clinical note transcription with the patient, who gave verbal consent to proceed.  Sherri Harris is a 28 year old female who presents with depression.  She has been experiencing severe depression since returning home after a recent hospitalization for pancreatitis. She describes feeling as though she is in a 'deep, dark hole' and is on the 'verge of jumping off a bridge.' She believes she needs to restart medication for her depression, specifically mentioning citalopram , which she had been on previously. She attributes her current depressive state to situational factors, including a recent breakup and the aftermath of her pancreatitis diagnosis.  She was hospitalized on April 9th and discharged on April 17th after being treated for pancreatitis, which she describes as 'the worst pain I've ever felt.' She was treated with fluids and pain medication during her hospital stay. No changes were made to her medications during this time. Since returning home, she has started taking Vyvanse, her birth control, and naltrexone , which she obtained from another doctor. She is not currently taking vraylar  or celexa , which she had been on previously for mood stabilization.  She denies any alcohol  consumption since her discharge, stating 'this is my rock bottom.' She acknowledges the potential for recurrent pancreatitis with  alcohol  use and describes the pain as 'terrible' when it occurs.  For sleep, she has been taking lunesta, which she obtained from her sister [name], and reports it has been helpful. She is unsure of the dose but believes it to be 3 mg, which she has used previously.  Socially, she is living with her mother and [name], although [name] is planning to move out soon. She is currently not in contact with her boyfriend, who also has an alcohol  problem, and describes the relationship as potentially over.      Past Medical History:  Diagnosis Date   Anxiety    Hypertension     Current Outpatient Medications  Medication Sig Dispense Refill   citalopram  (CELEXA ) 40 MG tablet Take 1 tablet (40 mg total) by mouth daily. 90 tablet 1   Norethindrone-Ethinyl Estradiol-Fe Biphas (LO LOESTRIN FE ) 1 MG-10 MCG / 10 MCG tablet Take 1 tablet by mouth daily. 84 tablet 0   Norethindrone-Ethinyl Estradiol-Fe Biphas (LO LOESTRIN FE ) 1 MG-10 MCG / 10 MCG tablet Take 1 tablet by mouth daily. 84 tablet 0   spironolactone  (ALDACTONE ) 50 MG tablet Take 1 tablet (50 mg total) by mouth 2 (two) times daily. 180 tablet 1   VRAYLAR  1.5 MG capsule Take 1 capsule (1.5 mg total) by mouth daily. 90 capsule 1   No current facility-administered medications for this visit.    Allergies  Allergen Reactions   Amoxicillin Rash    REACTION: rash   Penicillins Rash    Family History  Problem Relation Age of Onset   Hypertension Mother    Thyroid  disease Mother    Hypertension Maternal Aunt    Hypertension Maternal Uncle  Alcohol  abuse Maternal Uncle    Heart disease Maternal Grandmother    Cancer Maternal Grandmother        Breast   Hypertension Father    Cardiomyopathy Father    Alcohol  abuse Maternal Grandfather    Alcohol  abuse Paternal Uncle     Social History   Socioeconomic History   Marital status: Single    Spouse name: Not on file   Number of children: Not on file   Years of education: Not on file    Highest education level: Not on file  Occupational History   Not on file  Tobacco Use   Smoking status: Never   Smokeless tobacco: Never  Vaping Use   Vaping status: Never Used  Substance and Sexual Activity   Alcohol  use: Yes    Comment: bottle of wine/day    Drug use: No   Sexual activity: Yes    Birth control/protection: Pill  Other Topics Concern   Not on file  Social History Narrative   Not on file   Social Drivers of Health   Financial Resource Strain: Low Risk  (04/10/2021)   Received from [facility] visits prior to 12/13/2022., [facility] visits prior to 12/13/2022.   Overall Financial Resource Strain (CARDIA)    Difficulty of Paying Living Expenses: Not very hard  Food Insecurity: No Food Insecurity (01/21/2024)   Received from [facility]   Hunger Vital Sign    Worried About Running Out of Food in the Last Year: Never true    Ran Out of Food in the Last Year: Never true  Transportation Needs: No Transportation Needs (01/21/2024)   Received from [facility] - Transportation    Lack of Transportation (Medical): No    Lack of Transportation (Non-Medical): No  Physical Activity: Sufficiently Active (04/10/2021)   Received from [facility] visits prior to 12/13/2022., [facility] visits prior to 12/13/2022.   Exercise Vital Sign    Days of Exercise per Week: 4 days    Minutes of Exercise per Session: 60 min  Stress: Stress Concern Present (04/10/2021)   Received from [facility] visits prior to 12/13/2022., [facility] visits prior to 12/13/2022.   [employer] [facility] [employer] - Occupational Stress Questionnaire    Feeling of Stress : Very much  Social Connections: Unknown (12/09/2022)   Received from [facility]   Social Network    Social Network: Not on file  Intimate Partner Violence: Unknown  (12/09/2022)   Received from [facility], [facility]   HITS    Physically Hurt: Not on file    Insult or Talk Down To: Not on file    Threaten Physical Harm: Not on file    Scream or Curse: Not on file  Recent Concern: Intimate Partner Violence - High Risk (09/14/2022)   [facility] prior to 12/13/2022., [facility] visits prior to 12/13/2022.   Safety    How often does anyone, including family and friends, physically hurt you?: Never    How often does anyone, including family and friends, insult or talk down to you?: Sometimes    How often does anyone, including family and friends, threaten you with harm?: Never    How often does anyone, including family and friends, scream or curse at you?: Never  Constitutional: Denies fever, malaise, fatigue, headache or abrupt weight changes.  Respiratory: Denies difficulty breathing, shortness of breath, cough or sputum production.   Cardiovascular: Denies chest pain, chest tightness, palpitations or swelling in the hands or feet.  Gastrointestinal: Denies abdominal pain, bloating, constipation, diarrhea or blood in the stool.  Neurological: Patient reports inattention and insomnia.  Denies dizziness, difficulty with memory, difficulty with speech or problems with balance and coordination.  Psych: Patient has a history of anxiety and depression.  Denies SI/HI.  No other specific complaints in a complete review of systems (except as listed in HPI above).  Observations/Objective:  Wt Readings from Last 3 Encounters:  12/24/23 157 lb 6.4 oz (71.4 kg)  07/16/22 134 lb (60.8 kg)  06/26/22 137 lb (62.1 kg)    General: Appears her stated age, well developed, well nourished in NAD. Pulmonary/Chest: Normal effort. No respiratory distress.  Neurological: Alert and oriented. Coordination normal.  Psychiatric: Mood and affect mildly flat. Behavior is normal. Judgment and thought content  normal.    BMET    Component Value Date/Time   NA 144 07/28/2023 1830   NA 138 01/22/2018 1012   K 3.6 07/28/2023 1830   CL 107 07/28/2023 1830   CO2 25 07/28/2023 1830   GLUCOSE 107 (H) 07/28/2023 1830   BUN 9 07/28/2023 1830   BUN 9 01/22/2018 1012   CREATININE 0.86 07/28/2023 1830   CREATININE 0.76 07/16/2022 1108   CALCIUM  8.8 (L) 07/28/2023 1830   GFRNONAA >60 07/28/2023 1830   GFRAA >60 04/20/2020 1216    Lipid Panel     Component Value Date/Time   CHOL 235 (H) 10/18/2022 0129   TRIG 78 10/18/2022 0129   HDL 125 10/18/2022 0129   CHOLHDL 1.9 10/18/2022 0129   VLDL 16 10/18/2022 0129   LDLCALC 94 10/18/2022 0129   LDLCALC 87 01/28/2022 1108    CBC    Component Value Date/Time   WBC 7.2 07/28/2023 1830   RBC 4.54 07/28/2023 1830   HGB 13.7 07/28/2023 1830   HGB 14.5 01/22/2018 1012   HCT 40.0 07/28/2023 1830   HCT 42.2 01/22/2018 1012   PLT 329 07/28/2023 1830   PLT 337 01/22/2018 1012   MCV 88.1 07/28/2023 1830   MCV 90 01/22/2018 1012   MCH 30.2 07/28/2023 1830   MCHC 34.3 07/28/2023 1830   RDW 12.6 07/28/2023 1830   RDW 12.4 01/22/2018 1012   LYMPHSABS 3.2 10/18/2022 0129   MONOABS 0.4 10/18/2022 0129   EOSABS 0.0 10/18/2022 0129   BASOSABS 0.1 10/18/2022 0129    Hgb A1C Lab Results  Component Value Date   HGBA1C 5.1 10/18/2022       Assessment and Plan:  Assessment and Plan    Acute pancreatitis Recently hospitalized for acute pancreatitis, likely due to alcohol  use. Educated on recurrence risk with alcohol  consumption.  Alcohol  use disorder Alcohol  use disorder with recent hospitalization for acute pancreatitis. Abstinent since discharge, desires recovery. Educated on pancreatitis recurrence risk with alcohol .  Anxiety, Depression and Insomnia Situational depression post-breakup and hospitalization. Prefers citalopram  for mood stabilization. Expressed self-harm risk, requiring immediate intervention. - Restart citalopram   (Celexa ). - Follow up with psychiatrist and therapist for close monitoring. - Prescribe Lunesta 3 mg for sleep.       RTC in 4 months for follow-up of chronic conditions Follow Up Instructions:    I discussed the assessment and treatment plan with the patient. The patient was provided an opportunity to ask questions and all were  answered. The patient agreed with the plan and demonstrated an understanding of the instructions.   The patient was advised to call back or seek an in-person evaluation if the symptoms worsen or if the condition fails to improve as anticipated.   Helayne Lo, NP

## 2024-02-19 NOTE — Patient Instructions (Signed)
Pancreatitis Eating Plan Pancreatitis is when your pancreas gets irritated and swollen (inflamed). The pancreas is a small gland behind your stomach. It helps your body digest food and regulate your blood sugar. Pancreatitis can affect how your body digests food, especially foods with fat. You may also have other symptoms such as pain in your abdomen (abdominal pain) or nausea. When you have pancreatitis, following a low-fat eating plan may help you manage symptoms and recover faster. Work with your health care provider or a dietitian to create an eating plan that is right for you. What are tips for following this plan? Reading food labels Use the information on food labels to help keep track of how much fat you eat: Check the serving size. Look for the amount of total fat in grams (g) in one serving. Low-fat foods have 3 g of fat or less per serving. Fat-free foods have 0.5 g of fat or less per serving. Keep track of how much fat you eat based on how many servings you eat. For example, if you eat two servings, the amount of fat you eat will be twice what is listed on the label. Shopping  Buy low-fat or nonfat foods, such as: Fresh, frozen, or canned fruits and vegetables. Grains, including pasta, bread, and rice. Lean meat, poultry, fish, and other protein foods. Low-fat or nonfat dairy. Avoid buying bakery products and other sweets made with whole milk, butter, and eggs. Avoid buying snack foods with added fat, such as anything with butter or cheese flavoring. Cooking Remove skin from poultry, and remove extra fat from meat. Limit the amount of fat and oil you use to 6 tsp (30 mL) or less per day. Cook using low-fat methods, such as boiling, broiling, grilling, steaming, or baking. Use spray oil to cook. Add fat-free chicken broth to add flavor and moisture. Avoid adding cream to thicken soups or sauces. Use other thickeners such as corn starch or tomato paste. Meal planning  Eat a  low-fat diet as told by your dietitian. For most people, this means having no more than 55-65 g of fat each day. Eat small, frequent meals throughout the day. For example, you may have 5 or 6 small meals instead of 3 large meals. Drink enough fluid to keep your urine pale yellow. Do not drink alcohol. Talk to your health care provider if you need help stopping. Limit how much caffeine you have, including black coffee, black and green tea, soft drinks with caffeine, and energy drinks. Plan to include a variety of foods in your diet. Include fruits, vegetables, whole grains and lean proteins, and low-fat or nonfat dairy. You need a balanced diet for good overall health. General information Let your health care provider or dietitian know if you have unplanned weight loss on this eating plan. You may be told to follow a clear liquid or soft food diet when symptoms come back, which is called a flare. Talk with your health care provider about how to manage your diet during symptoms of a flare. Take any vitamins or supplements as told by your health care provider. You may need to take: Extra vitamins that dissolve in fat (are fat soluble), such as vitamins A, D, E, and K. Nutritional supplements. Work with a dietitian, especially if you have other conditions such as obesity, osteoporosis, or diabetes mellitus. Some people need extra treatments, such as: Pills or capsules to replace enzymes (oral pancreatic enzyme replacement therapy). Feedings through a tube in the stomach or intestine (  enteral feedings). What foods should I avoid? Fruits Fried fruits. Fruits served with butter or cream. Vegetables Fried vegetables. Vegetables cooked with butter, cheese, or cream. Grains Biscuits, waffles, donuts, pastries, and croissants. Pies and cookies. Butter-flavored popcorn. Regular crackers. Meats and other proteins Fatty cuts of meat. Poultry with skin. Organ meats. Precooked or cured meat, such as sausages  or meat loaves. Whole eggs. Nuts and nut butters. Dairy Whole and 2% milk. Whole milk yogurt. Whole milk ice cream. Cream and half-and-half. Cheese, such as cream cheese. Sour cream. Beverages Wine, beer, and liquor. The items listed above may not be a complete list of foods and beverages you should avoid. Contact a dietitian for more information. Summary Pancreatitis can affect how your body digests food, especially foods with fat. When you have pancreatitis, it is recommended that you follow a low-fat eating plan to help you recover faster and manage symptoms. Do not drink alcohol. Limit the amount of caffeine you have, and drink enough fluid to keep your urine pale yellow. This information is not intended to replace advice given to you by your health care provider. Make sure you discuss any questions you have with your health care provider. Document Revised: 09/18/2021 Document Reviewed: 09/18/2021 Elsevier Patient Education  2024 Elsevier Inc.  

## 2024-02-25 ENCOUNTER — Telehealth: Payer: Self-pay

## 2024-02-25 ENCOUNTER — Encounter: Payer: Self-pay | Admitting: Internal Medicine

## 2024-02-25 DIAGNOSIS — K852 Alcohol induced acute pancreatitis without necrosis or infection: Secondary | ICD-10-CM

## 2024-02-25 NOTE — Telephone Encounter (Signed)
 Copied from CRM [id number]. Topic: Clinical - Request for Lab/Test Order >> Feb 25, 2024 11:05 AM Sherri Harris wrote: Reason for CRM: Patient needs labs as a follow up from hospital visit in March. Callback number is [phone].

## 2024-02-25 NOTE — Addendum Note (Signed)
 Addended by: Carollynn Cirri on: 02/25/2024 11:29 AM   Modules accepted: Orders

## 2024-02-25 NOTE — Telephone Encounter (Signed)
 Appt scheduled for tomorrow for lab work

## 2024-02-25 NOTE — Telephone Encounter (Signed)
 Labs ordered. Please have her schedule lab only appt

## 2024-02-26 ENCOUNTER — Other Ambulatory Visit

## 2024-02-26 DIAGNOSIS — K852 Alcohol induced acute pancreatitis without necrosis or infection: Secondary | ICD-10-CM | POA: Diagnosis not present

## 2024-02-27 LAB — COMPREHENSIVE METABOLIC PANEL WITH GFR
AG Ratio: 1.7 (calc) (ref 1.0–2.5)
ALT: 12 U/L (ref 6–29)
AST: 13 U/L (ref 10–30)
Albumin: 4.2 g/dL (ref 3.6–5.1)
Alkaline phosphatase (APISO): 47 U/L (ref 31–125)
BUN: 8 mg/dL (ref 7–25)
CO2: 22 mmol/L (ref 20–32)
Calcium: 9.4 mg/dL (ref 8.6–10.2)
Chloride: 106 mmol/L (ref 98–110)
Creat: 0.83 mg/dL (ref 0.50–0.96)
Globulin: 2.5 g/dL (ref 1.9–3.7)
Glucose, Bld: 85 mg/dL (ref 65–99)
Potassium: 4.7 mmol/L (ref 3.5–5.3)
Sodium: 139 mmol/L (ref 135–146)
Total Bilirubin: 0.5 mg/dL (ref 0.2–1.2)
Total Protein: 6.7 g/dL (ref 6.1–8.1)
eGFR: 98 mL/min/{1.73_m2} (ref >=60)

## 2024-02-27 LAB — MAGNESIUM: Magnesium: 1.9 mg/dL (ref 1.5–2.5)

## 2024-02-27 LAB — LIPASE: Lipase: 34 U/L (ref 7–60)

## 2024-02-29 ENCOUNTER — Ambulatory Visit: Payer: Self-pay | Admitting: Internal Medicine

## 2024-02-29 MED ORDER — ONDANSETRON 4 MG PO TBDP: 4.0000 mg | ORAL_TABLET | Freq: Three times a day (TID) | ORAL | 0 refills | Status: AC | PRN

## 2024-04-10 ENCOUNTER — Encounter: Payer: Self-pay | Admitting: Internal Medicine

## 2024-04-25 ENCOUNTER — Encounter: Payer: Self-pay | Admitting: Internal Medicine

## 2024-04-25 DIAGNOSIS — Z3009 Encounter for other general counseling and advice on contraception: Secondary | ICD-10-CM

## 2024-04-25 MED ORDER — LO LOESTRIN FE 1 MG-10 MCG / 10 MCG PO TABS: 1.0000 | ORAL_TABLET | Freq: Every day | ORAL | 3 refills | Status: AC

## 2024-05-03 ENCOUNTER — Telehealth: Payer: Self-pay

## 2024-05-03 ENCOUNTER — Other Ambulatory Visit (HOSPITAL_COMMUNITY): Payer: Self-pay

## 2024-05-03 NOTE — Telephone Encounter (Signed)
 Pharmacy Patient Advocate Encounter   Received notification from Patient Advice Request messages that prior authorization for Lo Loestrin Fe  1 MG-10 MCG /10 MCG tablets  is required/requested.   Insurance verification completed.   The patient is insured through [employer] .   Per test claim: PA required; PA submitted to above mentioned insurance via CoverMyMeds Key/confirmation #/EOC [id number] Status is pending

## 2024-05-05 ENCOUNTER — Other Ambulatory Visit (HOSPITAL_COMMUNITY): Payer: Self-pay

## 2024-05-05 NOTE — Telephone Encounter (Signed)
 Pharmacy Patient Advocate Encounter  Received notification from [facility] that Prior Authorization for Lo Loestrin Fe  1 MG-10 MCG /10 MCG tablets  has been APPROVED from 05/03/24 to 05/03/25. Unable to obtain price due to refill too soon rejection, last fill date 05/03/24 next available fill date09/23/25   PA #/Case ID/Reference #: [id number]

## 2024-07-21 ENCOUNTER — Encounter: Payer: Self-pay | Admitting: Internal Medicine

## 2024-07-21 NOTE — Telephone Encounter (Signed)
 She can make an appointment to discuss.  She was due for her follow-up last month anyway

## 2024-08-15 ENCOUNTER — Encounter: Payer: Self-pay | Admitting: Radiology

## 2024-11-03 ENCOUNTER — Ambulatory Visit: Admitting: Internal Medicine

## 2024-11-17 ENCOUNTER — Encounter: Payer: Self-pay | Admitting: Internal Medicine

## 2024-11-18 NOTE — Telephone Encounter (Signed)
 She is past due for her follow-up anyway.  Have her schedule a virtual visit for follow-up of chronic conditions and to discuss ESA letter.

## 2024-11-22 ENCOUNTER — Ambulatory Visit: Admitting: Internal Medicine
